# Patient Record
Sex: Female | Born: 1965
Health system: Southern US, Community
[De-identification: ages and names within clinical notes are randomized; demographics above are authoritative.]

## PROBLEM LIST (undated history)

## (undated) DIAGNOSIS — D649 Anemia, unspecified: Secondary | ICD-10-CM

## (undated) DIAGNOSIS — T753XXA Motion sickness, initial encounter: Secondary | ICD-10-CM

## (undated) DIAGNOSIS — M1990 Unspecified osteoarthritis, unspecified site: Secondary | ICD-10-CM

## (undated) DIAGNOSIS — S5290XA Unspecified fracture of unspecified forearm, initial encounter for closed fracture: Secondary | ICD-10-CM

## (undated) DIAGNOSIS — A499 Bacterial infection, unspecified: Secondary | ICD-10-CM

## (undated) DIAGNOSIS — H209 Unspecified iridocyclitis: Secondary | ICD-10-CM

## (undated) DIAGNOSIS — S83206A Unspecified tear of unspecified meniscus, current injury, right knee, initial encounter: Secondary | ICD-10-CM

## (undated) DIAGNOSIS — U071 COVID-19: Secondary | ICD-10-CM

## (undated) DIAGNOSIS — L309 Dermatitis, unspecified: Secondary | ICD-10-CM

## (undated) DIAGNOSIS — J45909 Unspecified asthma, uncomplicated: Secondary | ICD-10-CM

## (undated) DIAGNOSIS — S62101A Fracture of unspecified carpal bone, right wrist, initial encounter for closed fracture: Secondary | ICD-10-CM

## (undated) DIAGNOSIS — R7303 Prediabetes: Secondary | ICD-10-CM

## (undated) DIAGNOSIS — N390 Urinary tract infection, site not specified: Secondary | ICD-10-CM

## (undated) HISTORY — DX: Unspecified tear of unspecified meniscus, current injury, right knee, initial encounter: S83.206A

## (undated) HISTORY — DX: COVID-19: U07.1

## (undated) HISTORY — DX: Anemia, unspecified: D64.9

## (undated) HISTORY — DX: Unspecified asthma, uncomplicated: J45.909

## (undated) HISTORY — PX: CATARACT EXTRACTION W/ INTRAOCULAR LENS IMPLANT: SHX1309

## (undated) HISTORY — DX: Unspecified fracture of unspecified forearm, initial encounter for closed fracture: S52.90XA

## (undated) HISTORY — DX: Urinary tract infection, site not specified: A49.9

## (undated) HISTORY — DX: Urinary tract infection, site not specified: N39.0

## (undated) HISTORY — DX: Unspecified iridocyclitis: H20.9

## (undated) HISTORY — DX: Fracture of unspecified carpal bone, right wrist, initial encounter for closed fracture: S62.101A

---
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
## 1986-07-21 HISTORY — PX: BREAST BIOPSY: SHX20

## 1986-07-21 HISTORY — PX: BREAST SURGERY: SHX581

## 2005-05-09 ENCOUNTER — Emergency Department: Payer: Self-pay | Admitting: Emergency Medicine

## 2005-05-09 ENCOUNTER — Other Ambulatory Visit: Payer: Self-pay

## 2005-07-24 ENCOUNTER — Ambulatory Visit: Payer: Self-pay | Admitting: Family Medicine

## 2005-08-11 ENCOUNTER — Other Ambulatory Visit: Admission: RE | Admit: 2005-08-11 | Discharge: 2005-08-11 | Payer: Self-pay | Admitting: Internal Medicine

## 2005-08-11 ENCOUNTER — Ambulatory Visit: Payer: Self-pay | Admitting: Family Medicine

## 2006-03-12 ENCOUNTER — Emergency Department: Payer: Self-pay | Admitting: Emergency Medicine

## 2006-04-22 ENCOUNTER — Emergency Department: Payer: Self-pay | Admitting: Emergency Medicine

## 2007-03-29 ENCOUNTER — Ambulatory Visit: Payer: Self-pay | Admitting: Nurse Practitioner

## 2007-04-24 ENCOUNTER — Emergency Department: Payer: Self-pay | Admitting: Emergency Medicine

## 2007-09-16 ENCOUNTER — Ambulatory Visit: Payer: Self-pay | Admitting: Otolaryngology

## 2008-02-24 ENCOUNTER — Ambulatory Visit: Payer: Self-pay | Admitting: Internal Medicine

## 2008-04-01 ENCOUNTER — Ambulatory Visit: Payer: Self-pay | Admitting: Family Medicine

## 2009-01-05 ENCOUNTER — Ambulatory Visit: Payer: Self-pay | Admitting: Internal Medicine

## 2009-05-16 ENCOUNTER — Ambulatory Visit: Payer: Self-pay | Admitting: Internal Medicine

## 2009-06-05 ENCOUNTER — Ambulatory Visit: Payer: Self-pay | Admitting: Otolaryngology

## 2009-07-01 ENCOUNTER — Ambulatory Visit: Payer: Self-pay | Admitting: Family Medicine

## 2010-08-27 ENCOUNTER — Ambulatory Visit: Payer: Self-pay | Admitting: Oncology

## 2010-09-19 ENCOUNTER — Ambulatory Visit: Payer: Self-pay | Admitting: Oncology

## 2010-10-20 ENCOUNTER — Ambulatory Visit: Payer: Self-pay | Admitting: Oncology

## 2010-11-19 ENCOUNTER — Ambulatory Visit: Payer: Self-pay | Admitting: Oncology

## 2011-02-25 ENCOUNTER — Ambulatory Visit: Payer: Self-pay | Admitting: Oncology

## 2011-03-22 ENCOUNTER — Ambulatory Visit: Payer: Self-pay | Admitting: Oncology

## 2011-05-07 ENCOUNTER — Ambulatory Visit: Payer: Self-pay | Admitting: Family Medicine

## 2011-05-30 ENCOUNTER — Ambulatory Visit: Payer: Self-pay | Admitting: Oncology

## 2011-06-21 ENCOUNTER — Ambulatory Visit: Payer: Self-pay | Admitting: Oncology

## 2011-07-29 ENCOUNTER — Ambulatory Visit: Payer: Self-pay

## 2011-07-29 LAB — URINALYSIS, COMPLETE
Bilirubin,UR: NEGATIVE
Nitrite: POSITIVE
Ph: 6.5 (ref 4.5–8.0)
Protein: 100
Specific Gravity: 1.02 (ref 1.003–1.030)

## 2011-08-01 LAB — URINE CULTURE

## 2011-08-22 ENCOUNTER — Ambulatory Visit: Payer: Self-pay | Admitting: Oncology

## 2011-08-22 LAB — CBC CANCER CENTER
Basophil #: 0 x10 3/mm (ref 0.0–0.1)
Basophil %: 0.9 %
Eosinophil %: 9.7 %
HCT: 36.6 % (ref 35.0–47.0)
HGB: 12.2 g/dL (ref 12.0–16.0)
Lymphocyte #: 1.5 x10 3/mm (ref 1.0–3.6)
MCH: 26.6 pg (ref 26.0–34.0)
MCHC: 33.4 g/dL (ref 32.0–36.0)
MCV: 80 fL (ref 80–100)
Monocyte #: 0.2 x10 3/mm (ref 0.0–0.7)
Neutrophil #: 1.6 x10 3/mm (ref 1.4–6.5)
WBC: 3.6 x10 3/mm (ref 3.6–11.0)

## 2011-08-22 LAB — IRON AND TIBC
Iron: 50 ug/dL (ref 50–170)
Unbound Iron-Bind.Cap.: 261 ug/dL

## 2011-08-22 LAB — FERRITIN: Ferritin (ARMC): 19 ng/mL (ref 8–388)

## 2011-08-25 ENCOUNTER — Encounter: Payer: Self-pay | Admitting: Pulmonary Disease

## 2011-08-25 ENCOUNTER — Ambulatory Visit (INDEPENDENT_AMBULATORY_CARE_PROVIDER_SITE_OTHER): Payer: Federal, State, Local not specified - PPO | Admitting: Pulmonary Disease

## 2011-08-25 VITALS — BP 122/80 | HR 70 | Temp 97.5°F | Ht 66.0 in | Wt 237.0 lb

## 2011-08-25 DIAGNOSIS — J45909 Unspecified asthma, uncomplicated: Secondary | ICD-10-CM | POA: Insufficient documentation

## 2011-08-25 DIAGNOSIS — IMO0001 Reserved for inherently not codable concepts without codable children: Secondary | ICD-10-CM | POA: Insufficient documentation

## 2011-08-25 DIAGNOSIS — K219 Gastro-esophageal reflux disease without esophagitis: Secondary | ICD-10-CM

## 2011-08-25 DIAGNOSIS — R062 Wheezing: Secondary | ICD-10-CM

## 2011-08-25 DIAGNOSIS — J309 Allergic rhinitis, unspecified: Secondary | ICD-10-CM

## 2011-08-25 MED ORDER — FLUTICASONE FUROATE 27.5 MCG/SPRAY NA SUSP: 2.0000 | Freq: Every day | NASAL | Status: DC

## 2011-08-25 MED ORDER — MOMETASONE FUROATE 220 MCG/INH IN AEPB: 2.0000 | INHALATION_SPRAY | Freq: Two times a day (BID) | RESPIRATORY_TRACT | Status: DC

## 2011-08-25 MED ORDER — ALBUTEROL SULFATE HFA 108 (90 BASE) MCG/ACT IN AERS: 2.0000 | INHALATION_SPRAY | RESPIRATORY_TRACT | Status: DC | PRN

## 2011-08-25 NOTE — Patient Instructions (Signed)
Use [place] Med rinses with distilled water at least twice per day using the instructions on the package. 1/2 hour after using the [place] Med rinse, use Veramyst once per day. Use generic zyrtec (over the counter decongestant, ask the pharmacist for a recommendation) as needed for sinus congestion. Start taking over the counter pepcid twice per day.   We will see you back in 4-6 weeks.

## 2011-08-25 NOTE — Assessment & Plan Note (Signed)
See asthma

## 2011-08-25 NOTE — Assessment & Plan Note (Signed)
Ms. Martello has moderate persistent asthma based on the frequency of her rescue inhaler results and nighttime symptoms.  On spirometry, she has obstruction only by the shape of her flow volume loop but has a normal FEV1 which is reassuring.  Based on the infrequency that she is taking her medications, I don't really think that the Symbicort is adding much so we should be able to start fresh with her med regimen.  Her reflux (seen on laryngoscopy by her ENT, intermittent symptoms) and her sinus congestion and post nasal drip contribute to her poor asthma control.    She also has wall to wall carpet, and a dog and a cat which may contribute to her asthma symptoms.  We will start today with the following changes: -stop symbicort (not really taking it anyway) -start asthmanex at max dose (2 puffs bid) -continue albuterol prn -start nasal saline rinses for sinuses -start using veramyst regularly -start using over the counter anti-histamine (zyrtec) -start pepcid OTC bid for reflux  If we have not made improvement with this then we will consider adding back symbicort or starting singulair on the next visit.  Will also need to consider steps to remove the allergens in her home (carpet, dog, rabbit).  RTC 4-6 weeks

## 2011-08-25 NOTE — Progress Notes (Signed)
Subjective:    Patient ID: Hannah Robinson, female    DOB: 1966-06-07, 46 y.o.   MRN: [id number]  HPI This is a very pleasant 46 y/o female who had childhood asthma who presented to our clinic today for evaluation of worsening asthma symptoms.  She states that when she was a child she had eczema, allergic rhinitis and asthma.  These symptoms subsided by the time she reached her twenties.  She has never smoked.  During the last two years she states that she has developed worsening intermittent wheezing, dry cough, shortness of breath, and nasal congestion.  She initially was evaluated by an ENT physician for this and was told that she not only had allergic rhinitis but also asthma.  She was started on symbicort and veramyst at that time.  She says that when she takes the symbicort she has good control of her symptoms and rarely needs albuterol use.  She also notes that the veramyst works well when she uses it.  However, she notes that the symbicort is expensive despite her insurance so she only uses it sparingly (she states one puff every other day, sometimes less).  On this minimalist regimen she notes that she uses her albuterol rescue inhaler 4-5 times per week, rarely more than once per day. She often wakes up short of breath and wheezing at night.    She also notes that the wheezing and shortness of breath are typically worse in the winter.  She is usually treated once or twice each winter for bronchitis.    She has heartburn symptoms about once per month, but was told by her ENT physician that she had evidence of reflux on laryngoscopy.  She does not use saline rinses or take anything for reflux.  She states that she underwent allergy testing when she was younger and was told that she was allergic to wheat, corn, grasses, nuts (not peanut), and animal dander.  Past Medical History  Diagnosis Date  . Childhood asthma      Family History  Problem Relation Age of Onset  . Lung cancer  Maternal Aunt   . Asthma Son   . Clotting disorder Mother      History   Social History  . Marital Status: Single    Spouse Name: N/A    Number of Children: 2  . 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 Smoking status: Never Smoker   . Smokeless tobacco: Never Used  . Alcohol Use: 2.0 oz/week    4 drink(s) per week     Wine  . Drug Use: No  . Sexually Active: Not on file   Other Topics Concern  . Not on file   Social History Narrative  . No narrative on file     Allergies  Allergen Reactions  . Amoxicillin     rash     Review of Systems  Constitutional: Negative for fever, chills and unexpected weight change.  HENT: Positive for congestion. Negative for ear pain, nosebleeds, sore throat, rhinorrhea, sneezing, trouble swallowing, dental problem, voice change, postnasal drip and sinus pressure.   Eyes: Negative for visual disturbance.  Respiratory: Positive for shortness of breath. Negative for cough and choking.   Cardiovascular: Positive for chest pain. Negative for leg swelling.  Gastrointestinal: Negative for vomiting, abdominal pain and diarrhea.  Genitourinary: Negative for difficulty urinating.  Musculoskeletal: Negative for arthralgias.  Skin: Negative for rash.  Neurological:  Negative for tremors, syncope and headaches.  Hematological: Does not bruise/bleed easily.      Objective:   Physical Exam  Filed Vitals:   08/25/11 1452  BP: 122/80  Pulse: 70  Temp: 97.5 F (36.4 C)  TempSrc: Oral  Height: 5\' 6"  (1.676 m)  Weight: 237 lb (107.502 kg)  SpO2: 99%   Gen: well appearing, no acute distress HEENT: NCAT, PERRL, EOMi, OP clear, neck supple without masses PULM: scattered wheezing (insp and exp) throughout, good airmovement CV: RRR, no mgr, mid systolic click?, no JVD AB: BS+, soft, nontender, no hsm Ext: warm, no edema, no clubbing, no cyanosis Derm: no rash or skin breakdown Neuro:  A&Ox4, CN II-XII intact, strength 5/5 in all 4 extremities  08/25/11 Asthma Control Questionnaire: (14/7) 2 (poor control)  08/25/11 PFT: Ratio: 75% FEV1 2.16 (85% pred)  ACQ Questionnaire References:  Juniper EF, O'Byrne PM, Guyatt GH, Pierre Bali DR. Development and validation of a questionnaire to measure asthma control. Eur Respir J 1999; 14: 902-7. Juniper EF, Duane Boston, Mrk AC, Sthl E. Measurement properties and interpretation of three shortened versions of the asthma control questionnaire. Respir Med 2005; 99: 553-8. Juniper EF, Isabel Caprice ED. Identifying 'well-controlled' and 'not well-controlled' asthma using the Asthma Control Questionnaire. Respir Med 2006; 100: 616- 621    Assessment & Plan:   Asthma Ms. Pascale has moderate persistent asthma based on the frequency of her rescue inhaler results and nighttime symptoms.  On spirometry, she has obstruction only by the shape of her flow volume loop but has a normal FEV1 which is reassuring.  Based on the infrequency that she is taking her medications, I don't really think that the Symbicort is adding much so we should be able to start fresh with her med regimen.  Her reflux (seen on laryngoscopy by her ENT, intermittent symptoms) and her sinus congestion and post nasal drip contribute to her poor asthma control.    She also has wall to wall carpet, and a dog and a cat which may contribute to her asthma symptoms.  We will start today with the following changes: -stop symbicort (not really taking it anyway) -start asthmanex at max dose (2 puffs bid) -continue albuterol prn -start nasal saline rinses for sinuses -start using veramyst regularly -start using over the counter anti-histamine (zyrtec) -start pepcid OTC bid for reflux  If we have not made improvement with this then we will consider adding back symbicort or starting singulair on the next visit.  Will also need to consider steps to remove the allergens  in her home (carpet, dog, rabbit).  RTC 4-6 weeks  Rhinitis, allergic See asthma  Reflux -Lifestyle modifications -pepcid bid    Updated Medication List Outpatient Encounter Prescriptions as of 08/25/2011  Medication Sig Dispense Refill  . budesonide-formoterol (SYMBICORT) 80-4.5 MCG/ACT inhaler Inhale 2 puffs into the lungs 2 (two) times daily.      . Cholecalciferol (VITAMIN D3) 50000 UNITS CAPS Take 1 capsule by mouth every 7 (seven) days.      . nitrofurantoin (MACRODANTIN) 100 MG capsule Take 100 mg by mouth 2 (two) times daily.      . norethindrone (MICRONOR,CAMILA,ERRIN) 0.35 MG tablet Take 1 tablet by mouth daily.      Marland Kitchen albuterol (PROAIR HFA) 108 (90 BASE) MCG/ACT inhaler Inhale 2 puffs into the lungs every 4 (four) hours as needed for wheezing.  1 Inhaler  3  . fluticasone (VERAMYST) 27.5 MCG/SPRAY nasal spray Place 2 sprays  into the nose daily.  10 g  3  . mometasone (ASMANEX) 220 MCG/INH inhaler Inhale 2 puffs into the lungs 2 (two) times daily.  1 Inhaler  12  . DISCONTD: fluticasone (VERAMYST) 27.5 MCG/SPRAY nasal spray Place 2 sprays into the nose daily.

## 2011-08-25 NOTE — Assessment & Plan Note (Signed)
-  Lifestyle modifications -pepcid bid

## 2011-08-28 ENCOUNTER — Telehealth: Payer: Self-pay | Admitting: Pulmonary Disease

## 2011-08-28 MED ORDER — BECLOMETHASONE DIPROPIONATE 80 MCG/ACT IN AERS: 2.0000 | INHALATION_SPRAY | Freq: Two times a day (BID) | RESPIRATORY_TRACT | Status: DC

## 2011-08-28 NOTE — Telephone Encounter (Signed)
Spoke with pt. She states that although the asmanex is covered by her insurance, she will still pay 62 $ for 1 month supply. She states unable to afford this and would like [place] to call in something else. I advised that she should speak with her pharmacist about other alternatives that may be less expensive, she may need to obtain her formulary list and then she will call us back and at that time will discuss other options with Dr. Kendrick Fries.

## 2011-08-28 NOTE — Telephone Encounter (Signed)
Will change to QVar bid.  Rx written

## 2011-08-28 NOTE — Telephone Encounter (Signed)
Verlon Au, Can you call her tomorrow to let her know I changed her over?  Thanks, Kipp Brood

## 2011-08-28 NOTE — Telephone Encounter (Signed)
Patient called back and said that she checked with her insurance and found that she can get Qvar for much cheaper. She is asking if this would be a good choice.

## 2011-09-02 LAB — HM MAMMOGRAPHY: HM Mammogram: NORMAL

## 2011-09-02 NOTE — Telephone Encounter (Signed)
LMOM for pt to be made aware that the rx for qvar was sent

## 2011-09-19 ENCOUNTER — Ambulatory Visit: Payer: Self-pay | Admitting: Oncology

## 2011-09-23 ENCOUNTER — Ambulatory Visit: Payer: Federal, State, Local not specified - PPO | Admitting: Pulmonary Disease

## 2011-10-08 ENCOUNTER — Encounter: Payer: Self-pay | Admitting: Pulmonary Disease

## 2011-10-08 ENCOUNTER — Ambulatory Visit (INDEPENDENT_AMBULATORY_CARE_PROVIDER_SITE_OTHER): Payer: Federal, State, Local not specified - PPO | Admitting: Pulmonary Disease

## 2011-10-08 DIAGNOSIS — J45909 Unspecified asthma, uncomplicated: Secondary | ICD-10-CM

## 2011-10-08 DIAGNOSIS — J309 Allergic rhinitis, unspecified: Secondary | ICD-10-CM

## 2011-10-08 MED ORDER — MONTELUKAST SODIUM 10 MG PO TABS: 10.0000 mg | ORAL_TABLET | Freq: Every day | ORAL | Status: DC

## 2011-10-08 MED ORDER — FLUTICASONE PROPIONATE HFA 110 MCG/ACT IN AERO: 2.0000 | INHALATION_SPRAY | Freq: Two times a day (BID) | RESPIRATORY_TRACT | Status: DC

## 2011-10-08 NOTE — Assessment & Plan Note (Signed)
Lometa initially improved quite a bit after starting the [place] Med rinses and regular inhaled steroid use.  However her allergic rhinitis has been flaring recently which is making her asthma worse.  She wants to switch her inhaled steroid due to cost.  Plan: -Change to Flovent inhaled bid; could change to inhaled if allergy symptoms and wheezing improve -Start monelukast 10mg  daily -continue rinses

## 2011-10-08 NOTE — Assessment & Plan Note (Signed)
Adding montelukast Continue saline rinses and nasal steroid and zyrtec

## 2011-10-08 NOTE — Patient Instructions (Signed)
Use Singulair 10mg  po daily. Use the Flovent 2 puffs twice per day of the inhaler for 2-3 weeks.  If your symptoms improve you can decrease down to 1 puff twice a day.  Continue your medication as written.  We will see you back in 2-3 months

## 2011-10-08 NOTE — Progress Notes (Signed)
Subjective:    Patient ID: Hannah Robinson, female    DOB: 1965/12/07, 46 y.o.   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  At that point she was non-compliant with her symbicort.  Her symptoms were consistent with moderate persistent asthma. She states that she underwent allergy testing when she was younger and was told that she was allergic to wheat, corn, grasses, nuts (not peanut), and animal dander.  She also noted heart burn symptoms.  HPI   3/20 ROV -- Initially Sevana was doing better after last visit, but in the last two weeks she has had more nasal mucus, wheezing, and cough.  She denies fever, chills, or body aches.  She says that she has noticed a scratchy throat as well.  Her son who also has asthma has been wheezing more.  She states that there is no color to mucus, no n/v/d.  She states that prior to this her allergic rhinitis and wheezing improved with the QVAR and nasal meds we wrote on the last visit.  Past Medical History  Diagnosis Date  . Childhood asthma      Review of Systems  Constitutional: Positive for fatigue. Negative for fever, chills and unexpected weight change.  HENT: Positive for congestion, sore throat, rhinorrhea, postnasal drip and sinus pressure. Negative for sneezing.   Eyes: Negative for visual disturbance.  Respiratory: Positive for cough, shortness of breath and wheezing. Negative for choking.   Cardiovascular: Negative for chest pain and leg swelling.  Gastrointestinal: Negative for nausea, vomiting, abdominal pain and diarrhea.  Musculoskeletal: Negative for myalgias and arthralgias.      Objective:   Physical Exam   Filed Vitals:   10/08/11 1651  BP: 120/76  Pulse: 88  Temp: 97.7 F (36.5 C)  TempSrc: Oral  Height: 5\' 6"  (1.676 m)  Weight: 243 lb (110.224 kg)  SpO2: 98%   Gen: well appearing, no acute distress HEENT: NCAT, PERRL, EOMi, OP clear, neck supple without  masses; bluish nasal mucosa noted PULM: scattered wheezing (insp and exp) throughout, good air movement CV: RRR, no mgr, no JVD AB: BS+, soft, nontender, no hsm Ext: warm, no edema, no clubbing, no cyanosis  08/25/11 Asthma Control Questionnaire: (14/7) 2 (poor control)   08/25/11 PFT: Ratio: 75% FEV1 2.16 (85% pred)  ACQ Questionnaire References:  Juniper EF, O'Byrne PM, Guyatt GH, Pierre Bali DR. Development and validation of a questionnaire to measure asthma control. Eur Respir J 1999; 14: 902-7. Juniper EF, Duane Boston, Mrk AC, Sthl E. Measurement properties and interpretation of three shortened versions of the asthma control questionnaire. Respir Med 2005; 99: 553-8. Juniper EF, Isabel Caprice ED. Identifying 'well-controlled' and 'not well-controlled' asthma using the Asthma Control Questionnaire. Respir Med 2006; 100: 616- 621    Assessment & Plan:   Asthma Roxan initially improved quite a bit after starting the [place] Med rinses and regular inhaled steroid use.  However her allergic rhinitis has been flaring recently which is making her asthma worse.  She wants to switch her inhaled steroid due to cost.  Plan: -Change to Flovent inhaled bid; could change to inhaled if allergy symptoms and wheezing improve -Start monelukast 10mg  daily -continue rinses  Rhinitis, allergic Adding montelukast Continue saline rinses and nasal steroid and zyrtec   We will try to obtain the old records of her prior allergy testing and consider referral to an allergist on the next  visit.  Updated Medication List Outpatient Encounter Prescriptions as of 10/08/2011  Medication Sig Dispense Refill  . albuterol (PROAIR HFA) 108 (90 BASE) MCG/ACT inhaler Inhale 2 puffs into the lungs every 4 (four) hours as needed for wheezing.  1 Inhaler  3  . beclomethasone (QVAR) 80 MCG/ACT inhaler Inhale 2 puffs into the lungs 2 (two) times daily.  1 Inhaler  2  . Cholecalciferol  (VITAMIN D3) 50000 UNITS CAPS Take 1 capsule by mouth every 7 (seven) days.      . fluticasone (VERAMYST) 27.5 MCG/SPRAY nasal spray Place 2 sprays into the nose daily.  10 g  3  . nitrofurantoin (MACRODANTIN) 100 MG capsule Take 100 mg by mouth 2 (two) times daily.      . norethindrone (MICRONOR,CAMILA,ERRIN) 0.35 MG tablet Take 1 tablet by mouth daily.

## 2011-12-07 ENCOUNTER — Emergency Department: Payer: Self-pay | Admitting: Emergency Medicine

## 2011-12-08 ENCOUNTER — Ambulatory Visit: Payer: Federal, State, Local not specified - PPO | Admitting: Pulmonary Disease

## 2012-01-01 ENCOUNTER — Ambulatory Visit: Payer: Federal, State, Local not specified - PPO | Admitting: Pulmonary Disease

## 2012-01-08 ENCOUNTER — Telehealth: Payer: Self-pay | Admitting: Pulmonary Disease

## 2012-01-08 MED ORDER — ALBUTEROL SULFATE HFA 108 (90 BASE) MCG/ACT IN AERS: 2.0000 | INHALATION_SPRAY | RESPIRATORY_TRACT | Status: DC | PRN

## 2012-01-08 NOTE — Telephone Encounter (Signed)
Pt states she went to the ED 3 weeks ago and was given Ventolin HFA for rescue purposes and it is not working for her. She would like to have a refill for her Proair called to the pharmacy. Pt is due for follow-up with Dr. Kendrick Fries in [place] on 01/30/12 and aware to keep that ov or call if having problems prior to that ov. Pt verbalized understanding. RX sent.

## 2012-01-30 ENCOUNTER — Ambulatory Visit (INDEPENDENT_AMBULATORY_CARE_PROVIDER_SITE_OTHER): Payer: Federal, State, Local not specified - PPO | Admitting: Pulmonary Disease

## 2012-01-30 ENCOUNTER — Other Ambulatory Visit: Payer: Self-pay | Admitting: *Deleted

## 2012-01-30 ENCOUNTER — Encounter: Payer: Self-pay | Admitting: Pulmonary Disease

## 2012-01-30 VITALS — BP 116/80 | HR 77 | Temp 98.1°F | Ht 65.5 in | Wt 244.8 lb

## 2012-01-30 DIAGNOSIS — J45909 Unspecified asthma, uncomplicated: Secondary | ICD-10-CM

## 2012-01-30 MED ORDER — ALBUTEROL SULFATE HFA 108 (90 BASE) MCG/ACT IN AERS: 2.0000 | INHALATION_SPRAY | RESPIRATORY_TRACT | Status: DC | PRN

## 2012-01-30 MED ORDER — ALBUTEROL SULFATE HFA 108 (90 BASE) MCG/ACT IN AERS: 2.0000 | INHALATION_SPRAY | Freq: Four times a day (QID) | RESPIRATORY_TRACT | Status: DC | PRN

## 2012-01-30 NOTE — Progress Notes (Signed)
Subjective:    Patient ID: Hannah Robinson, female    DOB: 1966/06/02, 46 y.o.   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  At that point she was non-compliant with her symbicort.  Her symptoms were consistent with moderate persistent asthma. She states that she underwent allergy testing when she was younger and was told that she was allergic to wheat, corn, grasses, nuts (not peanut), and animal dander.  She also noted heart burn symptoms.  HPI   3/20 ROV -- Initially Hannah Robinson was doing better after last visit, but in the last two weeks she has had more nasal mucus, wheezing, and cough.  She denies fever, chills, or body aches.  She says that she has noticed a scratchy throat as well.  Her son who also has asthma has been wheezing more.  She states that there is no color to mucus, no n/v/d.  She states that prior to this her allergic rhinitis and wheezing improved with the QVAR and nasal meds we wrote on the last visit.  01/30/12 ROV -- since her last visit and has had an episode of bronchitis requiring an antibiotic and prednisone therapy. This occurred around June 1. Since then she's been slowly improving but still notes that she wheezes in the morning when she wakes up and sometimes in the middle the night. The children state they believe that she is wheezing in the middle the night. She states that she has carpet and drapes in her bedroom which she believes are dusty. Her dog also sleeps in the bedroom. 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 But she says that she is able to complete a full days work without having to use the inhaler again until in the middle the night. She did not pick up the Singulair as she had such improvement after last visit and she does not think the Qvar as effective as Symbicort was in the past.  Past Medical History  Diagnosis Date    . Childhood asthma      Review of Systems  Constitutional: Negative for fever, chills, fatigue and unexpected weight change.  HENT: Negative for congestion, sore throat, rhinorrhea, sneezing, postnasal drip and sinus pressure.   Respiratory: Positive for shortness of breath and wheezing. Negative for cough and choking.   Cardiovascular: Negative for chest pain and leg swelling.      Objective:   Physical Exam   Filed Vitals:   01/30/12 1342  BP: 116/80  Pulse: 77  Temp: 98.1 F (36.7 C)  TempSrc: Oral  Height: 5' 5.5" (1.664 m)  Weight: 244 lb 12.8 oz (111.041 kg)  SpO2: 100%   Gen: well appearing, no acute distress HEENT: NCAT, PERRL, EOMi, OP clear, neck supple without masses; bluish nasal mucosa noted PULM: scattered wheezing  Noted again (insp/exp), good air movement CV: RRR, no mgr, no JVD AB: BS+, soft, nontender, no hsm Ext: warm, no edema, no clubbing, no cyanosis  08/25/11 Asthma Control Questionnaire: (14/7) 2 (poor control) 08/25/11 PFT: Ratio: 75% FEV1 2.16 (85% pred) 01/2012 Ratio 80%, FEV1 2.2L 88% pred 01/2012 ACQ 14/7  ACQ Questionnaire References:  Juniper EF, O'Byrne PM, Guyatt GH, Pierre Bali DR. Development and validation of a questionnaire to measure asthma control. Eur Respir J 1999; 14: 902-7. Juniper EF, Duane Boston, Mrk AC, Sthl E. Measurement properties and interpretation of three  shortened versions of the asthma control questionnaire. Respir Med 2005; 99: 553-8. Juniper EF, Isabel Caprice ED. Identifying 'well-controlled' and 'not well-controlled' asthma using the Asthma Control Questionnaire. Respir Med 2006; 100: 616- 621    Assessment & Plan:   Asthma Since her last visit of that has had an episode of bronchitis requiring antibiotics and prednisone. This occurred in June 2013. She has since recovered pretty well but she states that she is still having some shortness of breath in the mornings requiring daily rescue inhaler use.  Her spirometry and ACQ score have not changed but the subjective change bothers me. It is reasonable to step up her therapy at least for the next couple months. Am also concerned that she could have some allergy in her bedroom she states that she gets particularly short of breath at night and when she's in room. I've asked her to contact allergist about this  Plan: -Contact the allergist whom you've seen before and asked for followup appointment, have his records sent to me -Stop Qvar, start Symbicort 80/4.5 2 puffs twice a day -Continue other therapies     Updated Medication List Outpatient Encounter Prescriptions as of 01/30/2012  Medication Sig Dispense Refill  . albuterol (PROAIR HFA) 108 (90 BASE) MCG/ACT inhaler Inhale 2 puffs into the lungs every 4 (four) hours as needed for wheezing.  1 Inhaler  0  . beclomethasone (QVAR) 80 MCG/ACT inhaler Inhale 2 puffs into the lungs 2 (two) times daily.      . cetirizine (ZYRTEC) 10 MG tablet Take 10 mg by mouth daily.      . famotidine (PEPCID) 20 MG tablet Take 20 mg by mouth daily.      . fluticasone (VERAMYST) 27.5 MCG/SPRAY nasal spray Place 2 sprays into the nose daily.  10 g  3  . Sodium Chloride-Sodium Bicarb (CLASSIC NETI POT SINUS WASH NA) Use as directed twice daily as needed      . DISCONTD: fluticasone (FLOVENT HFA) 110 MCG/ACT inhaler Inhale 2 puffs into the lungs 2 (two) times daily.  1 Inhaler  12  . DISCONTD: montelukast (SINGULAIR) 10 MG tablet Take 1 tablet (10 mg total) by mouth at bedtime.  30 tablet  2

## 2012-01-30 NOTE — Patient Instructions (Signed)
Stop QVar Start Symbicort (use the sample 1 puff twice a day, the Rx will be 2 puffs twice a day) Call your allergist and schedule another appointment Send [place] the result of your allergy test. We will see you back in 3 months or sooner if needed.

## 2012-01-30 NOTE — Assessment & Plan Note (Signed)
Since her last visit of that has had an episode of bronchitis requiring antibiotics and prednisone. This occurred in June 2013. She has since recovered pretty well but she states that she is still having some shortness of breath in the mornings requiring daily rescue inhaler use. Her spirometry and ACQ score have not changed but the subjective change bothers me. It is reasonable to step up her therapy at least for the next couple months. Am also concerned that she could have some allergy in her bedroom she states that she gets particularly short of breath at night and when she's in room. I've asked her to contact allergist about this  Plan: -Contact the allergist whom you've seen before and asked for followup appointment, have his records sent to me -Stop Qvar, start Symbicort 80/4.5 2 puffs twice a day -Continue other therapies

## 2012-03-01 ENCOUNTER — Encounter: Payer: Self-pay | Admitting: Internal Medicine

## 2012-03-01 ENCOUNTER — Ambulatory Visit (INDEPENDENT_AMBULATORY_CARE_PROVIDER_SITE_OTHER): Payer: Federal, State, Local not specified - PPO | Admitting: Internal Medicine

## 2012-03-01 VITALS — BP 140/80 | HR 96 | Temp 98.7°F | Ht 66.0 in | Wt 245.2 lb

## 2012-03-01 DIAGNOSIS — L0231 Cutaneous abscess of buttock: Secondary | ICD-10-CM

## 2012-03-01 DIAGNOSIS — J45909 Unspecified asthma, uncomplicated: Secondary | ICD-10-CM

## 2012-03-01 DIAGNOSIS — R7309 Other abnormal glucose: Secondary | ICD-10-CM

## 2012-03-01 DIAGNOSIS — H209 Unspecified iridocyclitis: Secondary | ICD-10-CM | POA: Insufficient documentation

## 2012-03-01 DIAGNOSIS — D509 Iron deficiency anemia, unspecified: Secondary | ICD-10-CM | POA: Insufficient documentation

## 2012-03-01 DIAGNOSIS — R739 Hyperglycemia, unspecified: Secondary | ICD-10-CM

## 2012-03-01 DIAGNOSIS — L03317 Cellulitis of buttock: Secondary | ICD-10-CM | POA: Insufficient documentation

## 2012-03-01 DIAGNOSIS — Z1283 Encounter for screening for malignant neoplasm of skin: Secondary | ICD-10-CM | POA: Insufficient documentation

## 2012-03-01 LAB — POCT URINALYSIS DIPSTICK
Ketones, UA: NEGATIVE
Leukocytes, UA: NEGATIVE
Urobilinogen, UA: 0.2
pH, UA: 7

## 2012-03-01 MED ORDER — ALBUTEROL SULFATE HFA 108 (90 BASE) MCG/ACT IN AERS: 2.0000 | INHALATION_SPRAY | RESPIRATORY_TRACT | Status: DC | PRN

## 2012-03-01 MED ORDER — BUDESONIDE-FORMOTEROL FUMARATE 80-4.5 MCG/ACT IN AERO: 2.0000 | INHALATION_SPRAY | Freq: Two times a day (BID) | RESPIRATORY_TRACT | Status: DC

## 2012-03-01 MED ORDER — SULFAMETHOXAZOLE-TRIMETHOPRIM 800-160 MG PO TABS: 1.0000 | ORAL_TABLET | Freq: Two times a day (BID) | ORAL | Status: AC

## 2012-03-01 NOTE — Assessment & Plan Note (Signed)
Symptoms improving per pt, but continuing to have some wheezing, using Albuterol 2-3 times per day. Question if she might benefit from allergy testing. Will continue Symbicort and prn Albuterol for now. Follow up 2-4 weeks.

## 2012-03-01 NOTE — Progress Notes (Signed)
Subjective:    Patient ID: Hannah Robinson, female    DOB: 10-25-1965, 46 y.o.   MRN: [id number]  HPI 46 year old female with history of asthma and GERD presents to establish care. She reports she is generally feeling well. Her only concern today is a bump near her rectum. She reports this has been present for about 3 days. It is described as raised and painful. She has not been able to visualize the area. She denies any fever or chills.  In regards to her history of asthma, she notes some recent increased use of her albuterol. She has been followed by pulmonary medicine and recently started on Symbicort. She typically uses her rescue inhaler 2-3 times per day. She has occasional nonproductive cough and wheezing. She denies fever, chills, chest pain.  Outpatient Encounter Prescriptions as of 03/01/2012  Medication Sig Dispense Refill  . albuterol (PROAIR HFA) 108 (90 BASE) MCG/ACT inhaler Inhale 2 puffs into the lungs every 4 (four) hours as needed for wheezing.  1 Inhaler  5  . budesonide-formoterol (SYMBICORT) 80-4.5 MCG/ACT inhaler Inhale 2 puffs into the lungs 2 (two) times daily.  1 Inhaler  6  . cetirizine (ZYRTEC) 10 MG tablet Take 10 mg by mouth daily.      . famotidine (PEPCID) 20 MG tablet Take 20 mg by mouth daily.      . fluticasone (VERAMYST) 27.5 MCG/SPRAY nasal spray Place 2 sprays into the nose daily.  10 g  3  . Sodium Chloride-Sodium Bicarb (CLASSIC NETI POT SINUS WASH NA) Use as directed twice daily as needed      . sulfamethoxazole-trimethoprim (BACTRIM DS,SEPTRA DS) 800-160 MG per tablet Take 1 tablet by mouth 2 (two) times daily.  20 tablet  0   BP 140/80  Pulse 96  Temp 98.7 F (37.1 C) (Oral)  Ht 5\' 6"  (1.676 m)  Wt 245 lb 4 oz (111.245 kg)  BMI 39.58 kg/m2  SpO2 97%  Review of Systems  Constitutional: Negative for fever, chills, appetite change, fatigue and unexpected weight change.  HENT: Negative for ear pain, congestion, sore throat, trouble swallowing, neck  pain, voice change and sinus pressure.   Eyes: Negative for visual disturbance.  Respiratory: Positive for cough, shortness of breath and wheezing. Negative for stridor.   Cardiovascular: Negative for chest pain, palpitations and leg swelling.  Gastrointestinal: Negative for nausea, vomiting, abdominal pain, diarrhea, constipation, blood in stool, abdominal distention and anal bleeding.  Genitourinary: Negative for dysuria and flank pain.  Musculoskeletal: Negative for myalgias, arthralgias and gait problem.  Skin: Negative for color change and rash.  Neurological: Negative for dizziness and headaches.  Hematological: Negative for adenopathy. Does not bruise/bleed easily.  Psychiatric/Behavioral: Negative for suicidal ideas, disturbed wake/sleep cycle and dysphoric mood. The patient is not nervous/anxious.        Objective:   Physical Exam  Constitutional: She is oriented to person, place, and time. She appears well-developed and well-nourished. No distress.  HENT:  Head: Normocephalic and atraumatic.  Right Ear: External ear normal.  Left Ear: External ear normal.  Nose: Nose normal.  Mouth/Throat: Oropharynx is clear and moist. No oropharyngeal exudate.  Eyes: Conjunctivae are normal. Pupils are equal, round, and reactive to light. Right eye exhibits no discharge. Left eye exhibits no discharge. No scleral icterus.  Neck: Normal range of motion. Neck supple. No tracheal deviation present. No thyromegaly present.  Cardiovascular: Normal rate, regular rhythm, normal heart sounds and intact distal pulses.  Exam reveals no gallop and no friction  rub.   No murmur heard. Pulmonary/Chest: Effort normal. No respiratory distress. She has decreased breath sounds (prolonged expiration). She has wheezes. She has no rales. She exhibits no tenderness.  Genitourinary:     Musculoskeletal: Normal range of motion. She exhibits no edema and no tenderness.  Lymphadenopathy:    She has no cervical  adenopathy.  Neurological: She is alert and oriented to person, place, and time. No cranial nerve deficit. She exhibits normal muscle tone. Coordination normal.  Skin: Skin is warm and dry. No rash noted. She is not diaphoretic. No erythema. No pallor.  Psychiatric: She has a normal mood and affect. Her behavior is normal. Judgment and thought content normal.          Assessment & Plan:

## 2012-03-01 NOTE — Assessment & Plan Note (Signed)
Pt reports borderline DM. Will check A1c with labs.

## 2012-03-01 NOTE — Assessment & Plan Note (Signed)
Will set up yearly exam with derm.

## 2012-03-01 NOTE — Assessment & Plan Note (Signed)
H/o Iritis. Will set up follow up with opthalmology.

## 2012-03-01 NOTE — Assessment & Plan Note (Signed)
Pt reports iron def anemia. Will check CBC and ferritin with labs.

## 2012-03-01 NOTE — Assessment & Plan Note (Signed)
Left sided perirectal abscess. Will treat empirically with Bactrim and will set up gen surgery referral for I and D.

## 2012-03-02 ENCOUNTER — Other Ambulatory Visit: Payer: Self-pay | Admitting: *Deleted

## 2012-03-02 LAB — COMPREHENSIVE METABOLIC PANEL
ALT: 18 U/L (ref 0–35)
AST: 25 U/L (ref 0–37)
Albumin: 4 g/dL (ref 3.5–5.2)
Alkaline Phosphatase: 70 U/L (ref 39–117)
Potassium: 3.9 mEq/L (ref 3.5–5.1)
Sodium: 137 mEq/L (ref 135–145)
Total Bilirubin: 0.3 mg/dL (ref 0.3–1.2)
Total Protein: 8.4 g/dL — ABNORMAL HIGH (ref 6.0–8.3)

## 2012-03-02 LAB — FERRITIN: Ferritin: 15.8 ng/mL (ref 10.0–291.0)

## 2012-03-02 LAB — CBC WITH DIFFERENTIAL/PLATELET
Basophils Absolute: 0 10*3/uL (ref 0.0–0.1)
Basophils Relative: 0.7 % (ref 0.0–3.0)
Eosinophils Absolute: 0.3 10*3/uL (ref 0.0–0.7)
Lymphocytes Relative: 43.2 % (ref 12.0–46.0)
MCHC: 32.2 g/dL (ref 30.0–36.0)
MCV: 81.3 fl (ref 78.0–100.0)
Monocytes Absolute: 0.3 10*3/uL (ref 0.1–1.0)
Neutrophils Relative %: 44.1 % (ref 43.0–77.0)
Platelets: 260 10*3/uL (ref 150.0–400.0)
RBC: 4.68 Mil/uL (ref 3.87–5.11)

## 2012-03-02 LAB — LIPID PANEL
Cholesterol: 152 mg/dL (ref 0–200)
LDL Cholesterol: 69 mg/dL (ref 0–99)
Total CHOL/HDL Ratio: 2
VLDL: 8.6 mg/dL (ref 0.0–40.0)

## 2012-03-02 MED ORDER — CIPROFLOXACIN HCL 250 MG PO TABS: 250.0000 mg | ORAL_TABLET | Freq: Two times a day (BID) | ORAL | Status: DC

## 2012-03-03 ENCOUNTER — Ambulatory Visit: Payer: Federal, State, Local not specified - PPO | Admitting: Internal Medicine

## 2012-03-04 LAB — URINE CULTURE

## 2012-03-10 ENCOUNTER — Other Ambulatory Visit (INDEPENDENT_AMBULATORY_CARE_PROVIDER_SITE_OTHER): Payer: Federal, State, Local not specified - PPO | Admitting: *Deleted

## 2012-03-10 DIAGNOSIS — N390 Urinary tract infection, site not specified: Secondary | ICD-10-CM

## 2012-03-12 LAB — URINE CULTURE
Colony Count: NO GROWTH
Organism ID, Bacteria: NO GROWTH

## 2012-04-02 ENCOUNTER — Ambulatory Visit: Payer: Federal, State, Local not specified - PPO | Admitting: Internal Medicine

## 2012-04-12 ENCOUNTER — Ambulatory Visit: Payer: Federal, State, Local not specified - PPO | Admitting: Internal Medicine

## 2012-05-12 ENCOUNTER — Telehealth: Payer: Self-pay | Admitting: Internal Medicine

## 2012-05-12 NOTE — Telephone Encounter (Signed)
No samples of Symbicort at this time, advised patient to call back at the end of the week to check again.

## 2012-05-12 NOTE — Telephone Encounter (Signed)
Pt is wanting to know if she could get samples of Cymbacort.

## 2012-05-14 ENCOUNTER — Telehealth: Payer: Self-pay | Admitting: Internal Medicine

## 2012-05-14 ENCOUNTER — Telehealth: Payer: Self-pay | Admitting: Pulmonary Disease

## 2012-05-14 NOTE — Telephone Encounter (Signed)
Pt last seen 7.12.13 by BQ, given samples of symbicort.  Pt was then seen by PCP on 8.12.13 and symbicort 80-4.13mcg was rx'd.  Pt has upcoming ov w/ BQ 11.5.13.  Left detailed message on named voicemail informing pt sample will be provided and she may pick this up at her convenience Monday afternoon.  Advised pt to call with any other questions/conerns.  Will sign off.

## 2012-05-14 NOTE — Telephone Encounter (Signed)
Patient states that she spoke to Dr Corey Skains office and that are going to send her some samples over Monday.

## 2012-05-14 NOTE — Telephone Encounter (Signed)
Pt called checking to see if we have samples of symbort

## 2012-05-24 ENCOUNTER — Ambulatory Visit: Payer: Federal, State, Local not specified - PPO | Admitting: Pulmonary Disease

## 2012-05-25 ENCOUNTER — Ambulatory Visit: Payer: Federal, State, Local not specified - PPO | Admitting: Pulmonary Disease

## 2012-05-25 ENCOUNTER — Telehealth: Payer: Self-pay | Admitting: Pulmonary Disease

## 2012-05-25 DIAGNOSIS — J45909 Unspecified asthma, uncomplicated: Secondary | ICD-10-CM

## 2012-05-25 NOTE — Telephone Encounter (Signed)
Patient needing samples of Pro Air, Veramist and Symbicort 80 mg.

## 2012-05-25 NOTE — Telephone Encounter (Signed)
No samples available Will bring to office next wk Will hold in my basket until then Pt aware

## 2012-06-01 MED ORDER — BUDESONIDE-FORMOTEROL FUMARATE 80-4.5 MCG/ACT IN AERO: 2.0000 | INHALATION_SPRAY | Freq: Two times a day (BID) | RESPIRATORY_TRACT | Status: DC

## 2012-06-01 NOTE — Telephone Encounter (Signed)
No samples of veramyst or proair 1 box of symbicort up front for pick up at the [place] office [facility] for the pt to be made aware

## 2012-07-12 ENCOUNTER — Telehealth: Payer: Self-pay | Admitting: Pulmonary Disease

## 2012-07-12 DIAGNOSIS — J45909 Unspecified asthma, uncomplicated: Secondary | ICD-10-CM

## 2012-07-12 MED ORDER — BUDESONIDE-FORMOTEROL FUMARATE 80-4.5 MCG/ACT IN AERO: 2.0000 | INHALATION_SPRAY | Freq: Two times a day (BID) | RESPIRATORY_TRACT | Status: DC

## 2012-07-12 NOTE — Telephone Encounter (Signed)
I spoke with Asher Muir at [place] office and she has 2 samples for the pt at front desk. Pt is aware. Carron Curie, CMA

## 2012-07-29 ENCOUNTER — Telehealth: Payer: Self-pay | Admitting: Internal Medicine

## 2012-07-29 NOTE — Telephone Encounter (Signed)
error 

## 2012-08-06 ENCOUNTER — Ambulatory Visit: Payer: Federal, State, Local not specified - PPO | Admitting: Internal Medicine

## 2012-08-09 ENCOUNTER — Encounter: Payer: Self-pay | Admitting: Internal Medicine

## 2012-08-09 ENCOUNTER — Ambulatory Visit (INDEPENDENT_AMBULATORY_CARE_PROVIDER_SITE_OTHER): Payer: Federal, State, Local not specified - PPO | Admitting: Internal Medicine

## 2012-08-09 VITALS — BP 118/70 | HR 64 | Temp 98.1°F | Ht 66.0 in | Wt 237.5 lb

## 2012-08-09 DIAGNOSIS — E559 Vitamin D deficiency, unspecified: Secondary | ICD-10-CM

## 2012-08-09 DIAGNOSIS — R200 Anesthesia of skin: Secondary | ICD-10-CM

## 2012-08-09 DIAGNOSIS — R209 Unspecified disturbances of skin sensation: Secondary | ICD-10-CM

## 2012-08-09 DIAGNOSIS — R300 Dysuria: Secondary | ICD-10-CM | POA: Insufficient documentation

## 2012-08-09 LAB — CBC WITH DIFFERENTIAL/PLATELET
Basophils Absolute: 0 10*3/uL (ref 0.0–0.1)
Eosinophils Absolute: 0.2 10*3/uL (ref 0.0–0.7)
Hemoglobin: 13.4 g/dL (ref 12.0–15.0)
Lymphocytes Relative: 46.7 % — ABNORMAL HIGH (ref 12.0–46.0)
MCHC: 32.8 g/dL (ref 30.0–36.0)
Neutro Abs: 1.4 10*3/uL (ref 1.4–7.7)
Neutrophils Relative %: 41.2 % — ABNORMAL LOW (ref 43.0–77.0)
Platelets: 248 10*3/uL (ref 150.0–400.0)
RDW: 16.1 % — ABNORMAL HIGH (ref 11.5–14.6)

## 2012-08-09 LAB — POCT URINALYSIS DIPSTICK
Protein, UA: NEGATIVE
Spec Grav, UA: 1.02
Urobilinogen, UA: 0.2

## 2012-08-09 LAB — COMPREHENSIVE METABOLIC PANEL
ALT: 18 U/L (ref 0–35)
Albumin: 3.8 g/dL (ref 3.5–5.2)
CO2: 27 mEq/L (ref 19–32)
Calcium: 9 mg/dL (ref 8.4–10.5)
Chloride: 102 mEq/L (ref 96–112)
GFR: 115.2 mL/min (ref >60.00)
Potassium: 4.1 mEq/L (ref 3.5–5.1)
Sodium: 138 mEq/L (ref 135–145)
Total Protein: 8.1 g/dL (ref 6.0–8.3)

## 2012-08-09 MED ORDER — ALBUTEROL SULFATE HFA 108 (90 BASE) MCG/ACT IN AERS: 2.0000 | INHALATION_SPRAY | RESPIRATORY_TRACT | Status: DC | PRN

## 2012-08-09 NOTE — Assessment & Plan Note (Signed)
Mild chronic dysuria and noted. Urinalysis today remarkable only for trace leukocytes. Will send urine for culture. Suspect symptoms most likely related to atrophic vaginitis.

## 2012-08-09 NOTE — Assessment & Plan Note (Signed)
History of vitamin D deficiency on high-dose vitamin D supplementation. Will check level with labs today.

## 2012-08-09 NOTE — Assessment & Plan Note (Signed)
Symptoms of bilateral hand numbness at night seem most consistent with carpal tunnel syndrome. Will have patient apply wrist braces bilaterally at bedtime. However, given some atypical symptoms, we'll also set up neurology evaluation for consideration of EMG testing. Will also check TSH, B12 with labs today.

## 2012-08-09 NOTE — Progress Notes (Signed)
Subjective:    Patient ID: Hannah Robinson, female    DOB: [birth year], 47 y.o.   MRN: [id number]  HPI 47 year old female with history of asthma presents for acute visit complaining of several month history of intermittent bilateral hand numbness. She reports that symptoms are most prominent at night. They occasionally wake her from sleep. Described as numbness in her fingertips. Also occasionally has soreness in her left antecubital fossa. Denies any recent change in activity. Denies any other symptoms such as fatigue, fever, chills. Notes a history in the past of carpal tunnel but has never had surgery to correct this.  Also concerned today about several month history of intermittent dysuria. Denies any fever, chills, flank pain, hematuria. Symptoms described as occasional urgency.  Also concerned today about several month history of intermittent fatigue. Denies any focal symptoms such as shortness of breath, chest pain, change in bowel habits, change in appetite.   Outpatient Encounter Prescriptions as of 08/09/2012  Medication Sig Dispense Refill  . albuterol (PROAIR HFA) 108 (90 BASE) MCG/ACT inhaler Inhale 2 puffs into the lungs every 4 (four) hours as needed for wheezing.  1 Inhaler  5  . budesonide-formoterol (SYMBICORT) 80-4.5 MCG/ACT inhaler Inhale 2 puffs into the lungs 2 (two) times daily.  1 Inhaler  0  . cetirizine (ZYRTEC) 10 MG tablet Take 10 mg by mouth daily.      . ergocalciferol (VITAMIN D2) 50000 UNITS capsule Take 50,000 Units by mouth once a week.      . famotidine (PEPCID) 20 MG tablet Take 20 mg by mouth daily.      . fluticasone (VERAMYST) 27.5 MCG/SPRAY nasal spray Place 2 sprays into the nose daily.  10 g  3  . Sodium Chloride-Sodium Bicarb (CLASSIC NETI POT SINUS WASH NA) Use as directed twice daily as needed       BP 118/70  Pulse 64  Temp 98.1 F (36.7 C) (Oral)  Ht 5\' 6"  (1.676 m)  Wt 237 lb 8 oz (107.729 kg)  BMI 38.33 kg/m2  SpO2 99%  Review of Systems    Constitutional: Positive for fatigue. Negative for fever, chills, appetite change and unexpected weight change.  HENT: Negative for ear pain, congestion, sore throat, trouble swallowing, neck pain, voice change and sinus pressure.   Eyes: Negative for visual disturbance.  Respiratory: Negative for cough, shortness of breath, wheezing and stridor.   Cardiovascular: Negative for chest pain, palpitations and leg swelling.  Gastrointestinal: Negative for nausea, vomiting, abdominal pain, diarrhea, constipation, blood in stool, abdominal distention and anal bleeding.  Genitourinary: Negative for dysuria and flank pain.  Musculoskeletal: Negative for myalgias, arthralgias and gait problem.  Skin: Negative for color change and rash.  Neurological: Positive for numbness. Negative for dizziness, weakness and headaches.  Hematological: Negative for adenopathy. Does not bruise/bleed easily.  Psychiatric/Behavioral: Negative for suicidal ideas, sleep disturbance and dysphoric mood. The patient is not nervous/anxious.        Objective:   Physical Exam  Constitutional: She is oriented to person, place, and time. She appears well-developed and well-nourished. No distress.  HENT:  Head: Normocephalic and atraumatic.  Right Ear: External ear normal.  Left Ear: External ear normal.  Nose: Nose normal.  Mouth/Throat: Oropharynx is clear and moist. No oropharyngeal exudate.  Eyes: Conjunctivae normal are normal. Pupils are equal, round, and reactive to light. Right eye exhibits no discharge. Left eye exhibits no discharge. No scleral icterus.  Neck: Normal range of motion. Neck supple. No tracheal deviation present. No  thyromegaly present.  Cardiovascular: Normal rate, regular rhythm, normal heart sounds and intact distal pulses.  Exam reveals no gallop and no friction rub.   No murmur heard. Pulmonary/Chest: Effort normal and breath sounds normal. No respiratory distress. She has no wheezes. She has no  rales. She exhibits no tenderness.  Musculoskeletal: Normal range of motion. She exhibits no edema and no tenderness.  Lymphadenopathy:    She has no cervical adenopathy.  Neurological: She is alert and oriented to person, place, and time. No cranial nerve deficit or sensory deficit. She exhibits normal muscle tone. Coordination normal.  Skin: Skin is warm and dry. No rash noted. She is not diaphoretic. No erythema. No pallor.  Psychiatric: She has a normal mood and affect. Her behavior is normal. Judgment and thought content normal.          Assessment & Plan:

## 2012-08-10 ENCOUNTER — Encounter: Payer: Self-pay | Admitting: *Deleted

## 2012-08-10 ENCOUNTER — Telehealth: Payer: Self-pay | Admitting: Internal Medicine

## 2012-08-10 LAB — VITAMIN D 25 HYDROXY (VIT D DEFICIENCY, FRACTURES): Vit D, 25-Hydroxy: 27 ng/mL — ABNORMAL LOW (ref 30–89)

## 2012-08-10 LAB — URINE CULTURE: Colony Count: 6000

## 2012-08-10 NOTE — Telephone Encounter (Signed)
Spoke with patient and advised results letter mailed to patients home address with results.  

## 2012-08-10 NOTE — Telephone Encounter (Signed)
Wanting lab results

## 2012-09-02 LAB — HM PAP SMEAR

## 2012-09-07 ENCOUNTER — Telehealth: Payer: Self-pay | Admitting: Pulmonary Disease

## 2012-09-07 MED ORDER — FLUTICASONE FUROATE 27.5 MCG/SPRAY NA SUSP: 2.0000 | Freq: Every day | NASAL | Status: DC

## 2012-09-07 NOTE — Telephone Encounter (Signed)
Rx has been called in, pt is aware. 

## 2012-09-09 ENCOUNTER — Encounter: Payer: Federal, State, Local not specified - PPO | Admitting: Internal Medicine

## 2012-09-27 ENCOUNTER — Telehealth: Payer: Self-pay | Admitting: Pulmonary Disease

## 2012-09-27 DIAGNOSIS — J45909 Unspecified asthma, uncomplicated: Secondary | ICD-10-CM

## 2012-09-27 MED ORDER — BUDESONIDE-FORMOTEROL FUMARATE 80-4.5 MCG/ACT IN AERO: 2.0000 | INHALATION_SPRAY | Freq: Two times a day (BID) | RESPIRATORY_TRACT | Status: DC

## 2012-09-27 NOTE — Telephone Encounter (Signed)
Spoke with patient requesting sample of symbicort. Sample given to [facility] to take to [place] office in the AM. Patient aware and nothing further needed at this time.

## 2012-10-31 LAB — HM MAMMOGRAPHY

## 2012-11-25 ENCOUNTER — Encounter: Payer: Self-pay | Admitting: Internal Medicine

## 2013-01-05 ENCOUNTER — Telehealth: Payer: Self-pay | Admitting: Pulmonary Disease

## 2013-01-05 MED ORDER — BUDESONIDE-FORMOTEROL FUMARATE 80-4.5 MCG/ACT IN AERO: 2.0000 | INHALATION_SPRAY | Freq: Two times a day (BID) | RESPIRATORY_TRACT | Status: DC

## 2013-01-05 NOTE — Telephone Encounter (Signed)
Pt aware 2 samples will be sent out to [place] when nurse goes out on Tuesday. Nothing further was needed

## 2013-01-23 ENCOUNTER — Ambulatory Visit: Payer: Self-pay

## 2013-01-23 LAB — URINALYSIS, COMPLETE

## 2013-01-25 LAB — URINE CULTURE

## 2013-03-27 ENCOUNTER — Other Ambulatory Visit: Payer: Self-pay | Admitting: Internal Medicine

## 2013-03-29 ENCOUNTER — Telehealth: Payer: Self-pay | Admitting: Pulmonary Disease

## 2013-03-29 MED ORDER — BUDESONIDE-FORMOTEROL FUMARATE 80-4.5 MCG/ACT IN AERO: 2.0000 | INHALATION_SPRAY | Freq: Two times a day (BID) | RESPIRATORY_TRACT | Status: DC

## 2013-03-29 NOTE — Telephone Encounter (Signed)
LM with pt to call back. Has not been seen since 01/2012. Pt will need OV.

## 2013-03-29 NOTE — Telephone Encounter (Signed)
Returning call.Hannah Robinson ° °

## 2013-03-29 NOTE — Telephone Encounter (Signed)
Pt returned call.  Spoke with patient and advised that she is overdue for appt and that this will need to be scheduled >> appt scheduled with BQ 9.23.14 @ [facility] in [place].  Pt asked "so what - are y'all just going to let me suffer until my appt?"  Advised patient that no, we will absolutely not "let her suffer" until her appt.  Advised that we will be happy to send an rx for her symbicort and provide a sample if available.  Pt okay with this and requests rx [facility] [place] >> done.  [facility] office, spoke with BQ's nurse Lillia Abed who reported no samples in [place] office today.  LMOM TCB x1 to inform pt that [place] has no samples.  She may have ONE sample next week when we return to that office if she would like.  Rx for Symbicort has been sent to verified pharmacy.

## 2013-03-29 NOTE — Telephone Encounter (Signed)
Pt informed that [place] has no samples of Symbicort at this time but she can have one sample next week when we return to that office.  Also notified that rx was sent to pharmacy.

## 2013-04-12 ENCOUNTER — Encounter: Payer: Self-pay | Admitting: Pulmonary Disease

## 2013-04-12 ENCOUNTER — Ambulatory Visit (INDEPENDENT_AMBULATORY_CARE_PROVIDER_SITE_OTHER): Payer: Federal, State, Local not specified - PPO | Admitting: Pulmonary Disease

## 2013-04-12 ENCOUNTER — Encounter: Payer: Self-pay | Admitting: *Deleted

## 2013-04-12 VITALS — BP 126/72 | HR 76 | Temp 97.7°F

## 2013-04-12 DIAGNOSIS — J45909 Unspecified asthma, uncomplicated: Secondary | ICD-10-CM

## 2013-04-12 MED ORDER — BUDESONIDE-FORMOTEROL FUMARATE 80-4.5 MCG/ACT IN AERO: 2.0000 | INHALATION_SPRAY | Freq: Two times a day (BID) | RESPIRATORY_TRACT | Status: DC

## 2013-04-12 NOTE — Progress Notes (Signed)
Subjective:    Patient ID: Hannah Robinson, female    DOB: [birth year], 47 y.o.   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  At that point she was non-compliant with her symbicort.  Her symptoms were consistent with moderate persistent asthma. She states that she underwent allergy testing when she was younger and was told that she was allergic to wheat, corn, grasses, nuts (not peanut), and animal dander.  She also noted heart burn symptoms.  HPI   3/20 ROV -- Initially Hannah Robinson was doing better after last visit, but in the last two weeks she has had more nasal mucus, wheezing, and cough.  She denies fever, chills, or body aches.  She says that she has noticed a scratchy throat as well.  Her son who also has asthma has been wheezing more.  She states that there is no color to mucus, no n/v/d.  She states that prior to this her allergic rhinitis and wheezing improved with the QVAR and nasal meds we wrote on the last visit.  01/30/12 ROV -- since her last visit and has had an episode of bronchitis requiring an antibiotic and prednisone therapy. This occurred around June 1. Since then she's been slowly improving but still notes that she wheezes in the morning when she wakes up and sometimes in the middle the night. The children state they believe that she is wheezing in the middle the night. She states that she has carpet and drapes in her bedroom which she believes are dusty. Her dog also sleeps in the bedroom. 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 But she says that she is able to complete a full days work without having to use the inhaler again until in the middle the night. She did not pick up the Singulair as she had such improvement after last visit and she does not think the Qvar as effective as Symbicort was in the past.  04/12/2013 ROV >> Hannah Robinson has been doing  well since last visit. She says that taking Symbicort regularly has prevented any flares of asthma in the last year. She continues to use the nasal steroid every day and has minimal sinus congestion. And unfortunately her son is sick right now with an asthma flare and so she is worried she might catch whatever he has. She's only had to use her albuterol once in the last several months. In general she has minimal wheezing and feels well on her current regimen. She has not had a flu shot yet and doesn't want to take it today.     Past Medical History  Diagnosis Date  . Childhood asthma   . Urinary tract bacterial infections   . Diabetes mellitus     borderline  . Anemia     iron infusions in past  . Iritis      Review of Systems  Constitutional: Negative for fever, chills, fatigue and unexpected weight change.  HENT: Negative for congestion, sore throat, rhinorrhea, sneezing, postnasal drip and sinus pressure.   Respiratory: Positive for shortness of breath and wheezing. Negative for cough and choking.   Cardiovascular: Negative for chest pain and leg swelling.      Objective:   Physical Exam   Filed Vitals:   04/12/13 1220  BP: 126/72  Pulse: 76  Temp: 97.7 F (36.5 C)  TempSrc: Oral  SpO2:  100%   Gen: well appearing, no acute distress HEENT: NCAT, PERRL, EOMi, OP clear, neck supple without masses; bluish nasal mucosa noted PULM: minimal wheezing R lung, good air movement CV: RRR, no mgr, no JVD AB: BS+, soft, nontender, no hsm Ext: warm, no edema, no clubbing, no cyanosis  08/25/11 Asthma Control Questionnaire: (14/7) 2 (poor control) 08/25/11 PFT: Ratio: 75% FEV1 2.16 (85% pred) 01/2012 Ratio 80%, FEV1 2.2L 88% pred 01/2012 ACQ 14/7  ACQ Questionnaire References:  Juniper EF, O'Byrne PM, Guyatt GH, Pierre Bali DR. Development and validation of a questionnaire to measure asthma control. Eur Respir J 1999; 14: 902-7. Juniper EF, Duane Boston, Mrk AC, Sthl E. Measurement  properties and interpretation of three shortened versions of the asthma control questionnaire. Respir Med 2005; 99: 553-8. Juniper EF, Isabel Caprice ED. Identifying 'well-controlled' and 'not well-controlled' asthma using the Asthma Control Questionnaire. Respir Med 2006; 100: 616- 621    Assessment & Plan:   Asthma This is been a stable interval for Hannah Robinson. She does well with the Symbicort 82 puffs twice a day. She should continue on this medicine indefinitely. She should also continue the Veramist for her allergic rhinitis. She really needs to get a flu shot though she does not want to have it today because she says "it is too early".  Plan: -Continue Symbicort -Continue Veramist - Get a flu shot as soon as possible - Followup with me in one year    Updated Medication List Outpatient Encounter Prescriptions as of 04/12/2013  Medication Sig Dispense Refill  . budesonide-formoterol (SYMBICORT) 80-4.5 MCG/ACT inhaler Inhale 2 puffs into the lungs 2 (two) times daily.  10.2 g  1  . cetirizine (ZYRTEC) 10 MG tablet Take 10 mg by mouth daily as needed.       . famotidine (PEPCID) 20 MG tablet Take 20 mg by mouth daily.      . fluticasone (VERAMYST) 27.5 MCG/SPRAY nasal spray Place 2 sprays into the nose daily.  10 g  3  . PROAIR HFA 108 (90 BASE) MCG/ACT inhaler INHALE 2 PUFFS INTO THE LUNGS EVERY 4 (FOUR) HOURS AS NEEDED FOR WHEEZING.  8.5 each  0  . [DISCONTINUED] ergocalciferol (VITAMIN D2) 50000 UNITS capsule Take 50,000 Units by mouth once a week.      . [DISCONTINUED] famotidine (PEPCID) 20 MG tablet Take 20 mg by mouth daily.      . [DISCONTINUED] Sodium Chloride-Sodium Bicarb (CLASSIC NETI POT SINUS WASH NA) Use as directed twice daily as needed       No facility-administered encounter medications on file as of 04/12/2013.

## 2013-04-12 NOTE — Assessment & Plan Note (Signed)
This is been a stable interval for Hannah Robinson. She does well with the Symbicort 82 puffs twice a day. She should continue on this medicine indefinitely. She should also continue the Veramist for her allergic rhinitis. She really needs to get a flu shot though she does not want to have it today because she says "it is too early".  Plan: -Continue Symbicort -Continue Veramist - Get a flu shot as soon as possible - Followup with me in one year

## 2013-04-12 NOTE — Patient Instructions (Signed)
Take the Symbicort 80/4.5 twice a day no matter how you feel Use the veramist every day Get a flu shot Follow up with [place] in one year or sooner if needed

## 2013-05-11 ENCOUNTER — Telehealth: Payer: Self-pay | Admitting: Internal Medicine

## 2013-05-11 NOTE — Telephone Encounter (Signed)
Asking if we have sample of Symbicort 80

## 2013-05-11 NOTE — Telephone Encounter (Signed)
Patient informed sample left upfront for pick up

## 2013-05-20 ENCOUNTER — Other Ambulatory Visit: Payer: Self-pay | Admitting: Internal Medicine

## 2013-05-23 NOTE — Telephone Encounter (Signed)
Refill? LAst visit acute 1/14

## 2013-06-06 ENCOUNTER — Telehealth: Payer: Self-pay | Admitting: *Deleted

## 2013-06-06 NOTE — Telephone Encounter (Signed)
Patient left message on voicemail requesting samples of Symbicort.

## 2013-06-06 NOTE — Telephone Encounter (Signed)
Fine to give samples if we have them 

## 2013-06-07 NOTE — Telephone Encounter (Signed)
Pt calling back wanting to know about getting those samples ??

## 2013-06-07 NOTE — Telephone Encounter (Signed)
Called and informed patient sample has been left upfront for her to pick up

## 2013-07-22 ENCOUNTER — Ambulatory Visit: Payer: Federal, State, Local not specified - PPO | Admitting: Adult Health

## 2013-07-26 ENCOUNTER — Ambulatory Visit (INDEPENDENT_AMBULATORY_CARE_PROVIDER_SITE_OTHER): Payer: Federal, State, Local not specified - PPO | Admitting: Internal Medicine

## 2013-07-26 ENCOUNTER — Encounter: Payer: Self-pay | Admitting: Internal Medicine

## 2013-07-26 ENCOUNTER — Encounter (INDEPENDENT_AMBULATORY_CARE_PROVIDER_SITE_OTHER): Payer: Self-pay

## 2013-07-26 VITALS — BP 110/70 | HR 80 | Temp 97.9°F | Ht 66.0 in | Wt 248.0 lb

## 2013-07-26 DIAGNOSIS — R3915 Urgency of urination: Secondary | ICD-10-CM

## 2013-07-26 DIAGNOSIS — H20023 Recurrent acute iridocyclitis, bilateral: Secondary | ICD-10-CM

## 2013-07-26 DIAGNOSIS — L309 Dermatitis, unspecified: Secondary | ICD-10-CM

## 2013-07-26 DIAGNOSIS — H20029 Recurrent acute iridocyclitis, unspecified eye: Secondary | ICD-10-CM | POA: Insufficient documentation

## 2013-07-26 DIAGNOSIS — N390 Urinary tract infection, site not specified: Secondary | ICD-10-CM | POA: Insufficient documentation

## 2013-07-26 DIAGNOSIS — L259 Unspecified contact dermatitis, unspecified cause: Secondary | ICD-10-CM

## 2013-07-26 LAB — POCT URINALYSIS DIPSTICK
BILIRUBIN UA: NEGATIVE
Glucose, UA: NEGATIVE
KETONES UA: NEGATIVE
NITRITE UA: POSITIVE
PH UA: 6
Protein, UA: 30
Spec Grav, UA: 1.025
Urobilinogen, UA: 0.2

## 2013-07-26 MED ORDER — CIPROFLOXACIN HCL 500 MG PO TABS: 500.0000 mg | ORAL_TABLET | Freq: Two times a day (BID) | ORAL | Status: DC

## 2013-07-26 NOTE — Patient Instructions (Signed)
Start Vanicream applied to skin 1-2 times daily to help with dryness.

## 2013-07-26 NOTE — Progress Notes (Signed)
Subjective:    Patient ID: Hannah Robinson, female    DOB: 06/15/1966, 48 y.o.   MRN: [id number]  HPI 48YO female presents for acute visit. Notes urinary urgency, and frequency. No dysuria or hematuria. No fever, chills, flank pain. Mild low back pain. No vaginal discharge. Some improvement with limiting caffeine. No meds taken for symptoms.  Also concerned about try it she rash over the back of her neck. This is been present for several days. She denies use of any new lotions, soaps, detergents. She is not currently applying anything to the rash.  Outpatient Encounter Prescriptions as of 07/26/2013  Medication Sig  . budesonide-formoterol (SYMBICORT) 80-4.5 MCG/ACT inhaler Inhale 2 puffs into the lungs 2 (two) times daily.  . cetirizine (ZYRTEC) 10 MG tablet Take 10 mg by mouth daily as needed.   . famotidine (PEPCID) 20 MG tablet Take 20 mg by mouth daily.  . fluticasone (VERAMYST) 27.5 MCG/SPRAY nasal spray Place 2 sprays into the nose daily.  . homatropine 5 % ophthalmic solution Place 1 drop into the left eye 2 (two) times daily.  . Multiple Vitamins-Minerals (WOMENS MULTIVITAMIN PLUS PO) Take by mouth.  . prednisoLONE acetate (PRED FORTE) 1 % ophthalmic suspension Place 1 drop into the left eye 4 (four) times daily.  . predniSONE (DELTASONE) 20 MG tablet Take 20 mg by mouth 2 (two) times daily with a meal.  . PROAIR HFA 108 (90 BASE) MCG/ACT inhaler INHALE 2 PUFFS INTO THE LUNGS EVERY 4 (FOUR) HOURS AS NEEDED FOR WHEEZING.   BP 110/70  Pulse 80  Temp(Src) 97.9 F (36.6 C) (Oral)  Ht 5\' 6"  (1.676 m)  Wt 248 lb (112.492 kg)  BMI 40.05 kg/m2  SpO2 98%    Review of Systems  Constitutional: Negative for fever, chills and fatigue.  Gastrointestinal: Negative for nausea, vomiting, abdominal pain, diarrhea, constipation and rectal pain.  Genitourinary: Positive for urgency and frequency. Negative for dysuria, hematuria, flank pain, decreased urine volume, vaginal bleeding, vaginal  discharge, difficulty urinating, vaginal pain and pelvic pain.  Skin: Positive for rash.       Objective:   Physical Exam  Constitutional: She is oriented to person, place, and time. She appears well-developed and well-nourished. No distress.  HENT:  Head: Normocephalic and atraumatic.  Right Ear: External ear normal.  Left Ear: External ear normal.  Nose: Nose normal.  Mouth/Throat: Oropharynx is clear and moist. No oropharyngeal exudate.  Eyes: Conjunctivae are normal. Pupils are equal, round, and reactive to light. Right eye exhibits no discharge. Left eye exhibits no discharge. No scleral icterus.  Neck: Normal range of motion. Neck supple. No tracheal deviation present. No thyromegaly present.  Cardiovascular: Normal rate, regular rhythm, normal heart sounds and intact distal pulses.  Exam reveals no gallop and no friction rub.   No murmur heard. Pulmonary/Chest: Effort normal and breath sounds normal. No accessory muscle usage. Not tachypneic. No respiratory distress. She has no decreased breath sounds. She has no wheezes. She has no rhonchi. She has no rales. She exhibits no tenderness.  Abdominal: There is no tenderness (no CVA tenderness).  Musculoskeletal: Normal range of motion. She exhibits no edema and no tenderness.  Lymphadenopathy:    She has no cervical adenopathy.  Neurological: She is alert and oriented to person, place, and time. No cranial nerve deficit. She exhibits normal muscle tone. Coordination normal.  Skin: Skin is warm and dry. No rash noted. She is not diaphoretic. No erythema. No pallor.     Psychiatric:  She has a normal mood and affect. Her behavior is normal. Judgment and thought content normal.          Assessment & Plan:

## 2013-07-26 NOTE — Assessment & Plan Note (Signed)
Symptoms and urinalysis consistent with urinary tract infection. Will send urine for culture. Start Cipro twice daily. Pt will call if any persistent or worsening symptoms.

## 2013-07-26 NOTE — Assessment & Plan Note (Signed)
Exam most consistent with dry skin. Encouraged use of emollient creams such as Vanicream. We also discussed use of hydrocortisone cream, however encouraged her to use this sparingly. Follow up if symptoms not improving.

## 2013-07-26 NOTE — Progress Notes (Signed)
Pre-visit discussion using our clinic review tool. No additional management support is needed unless otherwise documented below in the visit note.  

## 2013-07-26 NOTE — Assessment & Plan Note (Signed)
Patient currently being treated for iritis by ophthalmology. Will request notes.

## 2013-07-29 ENCOUNTER — Other Ambulatory Visit: Payer: Self-pay | Admitting: Internal Medicine

## 2013-07-29 LAB — CULTURE, URINE COMPREHENSIVE: Colony Count: >=100000

## 2013-07-29 MED ORDER — SULFAMETHOXAZOLE-TMP DS 800-160 MG PO TABS: 1.0000 | ORAL_TABLET | Freq: Two times a day (BID) | ORAL | Status: DC

## 2013-07-29 NOTE — Addendum Note (Signed)
Addended by: Ronna PolioWALKER, Delila Kuklinski A on: 07/29/2013 10:56 AM   Modules accepted: Orders

## 2013-08-02 ENCOUNTER — Telehealth: Payer: Self-pay | Admitting: *Deleted

## 2013-08-02 NOTE — Telephone Encounter (Signed)
Patient walked in the clinic on 07/18/13 requesting to leave an urine sample. State she was told by her doctor she could just stop by and drop off an urine sample anytime she felt like she was getting an UTI. Informed patient Dr. Dan HumphreysWalker was not in the office on this day and no other provider had any openings. Also she can not just leave an urine sample without being seen by a provider. Per patient she would call back to schedule an appointment or go to an urgent care to be evaluated.

## 2013-08-04 ENCOUNTER — Other Ambulatory Visit: Payer: Self-pay | Admitting: Internal Medicine

## 2013-09-02 ENCOUNTER — Ambulatory Visit (INDEPENDENT_AMBULATORY_CARE_PROVIDER_SITE_OTHER)
Admission: RE | Admit: 2013-09-02 | Discharge: 2013-09-02 | Disposition: A | Discharge status: Home / Self Care | Payer: Federal, State, Local not specified - PPO | Source: Ambulatory Visit | Attending: Internal Medicine | Admitting: Internal Medicine

## 2013-09-02 ENCOUNTER — Ambulatory Visit (INDEPENDENT_AMBULATORY_CARE_PROVIDER_SITE_OTHER): Payer: Federal, State, Local not specified - PPO | Admitting: Internal Medicine

## 2013-09-02 ENCOUNTER — Encounter: Payer: Self-pay | Admitting: Internal Medicine

## 2013-09-02 ENCOUNTER — Encounter: Payer: Self-pay | Admitting: *Deleted

## 2013-09-02 ENCOUNTER — Encounter (INDEPENDENT_AMBULATORY_CARE_PROVIDER_SITE_OTHER): Payer: Self-pay

## 2013-09-02 ENCOUNTER — Other Ambulatory Visit (HOSPITAL_COMMUNITY)
Admission: RE | Admit: 2013-09-02 | Discharge: 2013-09-02 | Disposition: A | Discharge status: Home / Self Care | Payer: Federal, State, Local not specified - PPO | Source: Ambulatory Visit | Attending: Internal Medicine | Admitting: Internal Medicine

## 2013-09-02 VITALS — BP 130/90 | HR 83 | Temp 98.0°F | Ht 66.0 in | Wt 245.2 lb

## 2013-09-02 DIAGNOSIS — R3915 Urgency of urination: Secondary | ICD-10-CM

## 2013-09-02 DIAGNOSIS — N390 Urinary tract infection, site not specified: Secondary | ICD-10-CM

## 2013-09-02 DIAGNOSIS — Z01419 Encounter for gynecological examination (general) (routine) without abnormal findings: Secondary | ICD-10-CM | POA: Insufficient documentation

## 2013-09-02 DIAGNOSIS — Z1151 Encounter for screening for human papillomavirus (HPV): Secondary | ICD-10-CM | POA: Insufficient documentation

## 2013-09-02 DIAGNOSIS — H209 Unspecified iridocyclitis: Secondary | ICD-10-CM

## 2013-09-02 DIAGNOSIS — L301 Dyshidrosis [pompholyx]: Secondary | ICD-10-CM

## 2013-09-02 DIAGNOSIS — R252 Cramp and spasm: Secondary | ICD-10-CM

## 2013-09-02 DIAGNOSIS — Z0000 Encounter for general adult medical examination without abnormal findings: Secondary | ICD-10-CM

## 2013-09-02 LAB — POCT URINALYSIS DIPSTICK
Bilirubin, UA: NEGATIVE
Glucose, UA: NEGATIVE
Ketones, UA: NEGATIVE
Leukocytes, UA: NEGATIVE
NITRITE UA: NEGATIVE
PH UA: 5
PROTEIN UA: NEGATIVE
RBC UA: NEGATIVE
Spec Grav, UA: >=1.03
Urobilinogen, UA: 0.2

## 2013-09-02 LAB — COMPREHENSIVE METABOLIC PANEL
ALBUMIN: 3.9 g/dL (ref 3.5–5.2)
ALT: 18 U/L (ref 0–35)
AST: 17 U/L (ref 0–37)
Alkaline Phosphatase: 59 U/L (ref 39–117)
BUN: 19 mg/dL (ref 6–23)
CO2: 24 mEq/L (ref 19–32)
Calcium: 9.4 mg/dL (ref 8.4–10.5)
Chloride: 106 mEq/L (ref 96–112)
Creatinine, Ser: 0.7 mg/dL (ref 0.4–1.2)
GFR: 112.82 mL/min (ref >60.00)
GLUCOSE: 132 mg/dL — AB (ref 70–99)
POTASSIUM: 4.1 meq/L (ref 3.5–5.1)
SODIUM: 141 meq/L (ref 135–145)
TOTAL PROTEIN: 7.7 g/dL (ref 6.0–8.3)
Total Bilirubin: 0.9 mg/dL (ref 0.3–1.2)

## 2013-09-02 LAB — HEMOGLOBIN A1C: Hgb A1c MFr Bld: 6.3 % (ref 4.6–6.5)

## 2013-09-02 LAB — CBC WITH DIFFERENTIAL/PLATELET
Basophils Absolute: 0 10*3/uL (ref 0.0–0.1)
Basophils Relative: 0.4 % (ref 0.0–3.0)
Eosinophils Absolute: 0 10*3/uL (ref 0.0–0.7)
Eosinophils Relative: 0.3 % (ref 0.0–5.0)
HCT: 41.4 % (ref 36.0–46.0)
Hemoglobin: 12.9 g/dL (ref 12.0–15.0)
Lymphocytes Relative: 14.9 % (ref 12.0–46.0)
Lymphs Abs: 1 10*3/uL (ref 0.7–4.0)
MCHC: 31.2 g/dL (ref 30.0–36.0)
MCV: 84.8 fl (ref 78.0–100.0)
Monocytes Absolute: 0.2 10*3/uL (ref 0.1–1.0)
Monocytes Relative: 3.8 % (ref 3.0–12.0)
NEUTROS PCT: 80.6 % — AB (ref 43.0–77.0)
Neutro Abs: 5.2 10*3/uL (ref 1.4–7.7)
PLATELETS: 287 10*3/uL (ref 150.0–400.0)
RBC: 4.88 Mil/uL (ref 3.87–5.11)
RDW: 16.7 % — ABNORMAL HIGH (ref 11.5–14.6)
WBC: 6.5 10*3/uL (ref 4.5–10.5)

## 2013-09-02 LAB — LIPID PANEL
CHOLESTEROL: 192 mg/dL (ref 0–200)
HDL: 90.8 mg/dL (ref >39.00)
LDL Cholesterol: 90 mg/dL (ref 0–99)
TRIGLYCERIDES: 56 mg/dL (ref 0.0–149.0)
Total CHOL/HDL Ratio: 2
VLDL: 11.2 mg/dL (ref 0.0–40.0)

## 2013-09-02 LAB — TSH: TSH: 0.81 u[IU]/mL (ref 0.35–5.50)

## 2013-09-02 MED ORDER — ALBUTEROL SULFATE HFA 108 (90 BASE) MCG/ACT IN AERS: INHALATION_SPRAY | RESPIRATORY_TRACT | Status: DC

## 2013-09-02 NOTE — Progress Notes (Signed)
Subjective:    Patient ID: Hannah Robinson, female    DOB: 10/09/1965, 48 y.o.   MRN: [id number]  HPI 48YO female presents for annual exam. She is generally feeling well. She was recently treated for UTI and culture pos for Staph. She has evaluation scheduled with [facility] urology for further evaluation. She continues to have some urinary urgency, but denies fever, chills, hematuria, flank pain.  Muscle cramping - She is concerned about intermittent right calf pain, described as camping which occurs at night. No leg swelling or change in skin color. No persistent pain. Does not take anything for this.  Skin change - She is also concerned about darkening of her skin on her hands and dry skin. She washes her hands several times per day and uses hand sanitizer. She also wears gloves most of the day. She has not been using any topical treatments for this.  Iritis - Noted in the past by her opthalmologist. He requests additional labs to be performed with her exam today. No recent visual changes.  She is UTD on mammogram, which is due again in 10/2013. She had last PAP in 2014 and reports this was normal, but generally has yearly PAP. She is not following any specific diet or exercise program.    Review of Systems  Constitutional: Negative for fever, chills, appetite change, fatigue and unexpected weight change.  HENT: Negative for congestion, ear pain, sinus pressure, sore throat, trouble swallowing and voice change.   Eyes: Negative for visual disturbance.  Respiratory: Negative for cough, shortness of breath, wheezing and stridor.   Cardiovascular: Negative for chest pain, palpitations and leg swelling.  Gastrointestinal: Negative for nausea, vomiting, abdominal pain, diarrhea, constipation, blood in stool, abdominal distention and anal bleeding.  Genitourinary: Negative for dysuria and flank pain.  Musculoskeletal: Positive for myalgias. Negative for arthralgias, gait problem and neck pain.    Skin: Positive for color change. Negative for rash.  Neurological: Negative for dizziness and headaches.  Hematological: Negative for adenopathy. Does not bruise/bleed easily.  Psychiatric/Behavioral: Negative for suicidal ideas, sleep disturbance and dysphoric mood. The patient is not nervous/anxious.    Outpatient Encounter Prescriptions as of 09/02/2013  Medication Sig  . budesonide-formoterol (SYMBICORT) 80-4.5 MCG/ACT inhaler Inhale 2 puffs into the lungs 2 (two) times daily.  . cetirizine (ZYRTEC) 10 MG tablet Take 10 mg by mouth daily as needed.   . famotidine (PEPCID) 20 MG tablet Take 20 mg by mouth daily.  . fluticasone (VERAMYST) 27.5 MCG/SPRAY nasal spray Place 2 sprays into the nose daily.  . Multiple Vitamins-Minerals (WOMENS MULTIVITAMIN PLUS PO) Take by mouth.  . prednisoLONE acetate (PRED FORTE) 1 % ophthalmic suspension Place 1 drop into the left eye 4 (four) times daily.  . predniSONE (DELTASONE) 20 MG tablet Take 20 mg by mouth 2 (two) times daily with a meal.  . PROAIR HFA 108 (90 BASE) MCG/ACT inhaler INHALE 2 PUFFS EVERY 4 HOURS AS NEEDED   BP 130/90  Pulse 83  Temp(Src) 98 F (36.7 C) (Oral)  Ht 5\' 6"  (1.676 m)  Wt 245 lb 4 oz (111.245 kg)  BMI 39.60 kg/m2  SpO2 99%     Objective:   Physical Exam  Constitutional: She is oriented to person, place, and time. She appears well-developed and well-nourished. No distress.  HENT:  Head: Normocephalic and atraumatic.  Right Ear: External ear normal.  Left Ear: External ear normal.  Nose: Nose normal.  Mouth/Throat: Oropharynx is clear and moist. No oropharyngeal exudate.  Eyes:  Conjunctivae are normal. Pupils are equal, round, and reactive to light. Right eye exhibits no discharge. Left eye exhibits no discharge. No scleral icterus.  Neck: Normal range of motion. Neck supple. No tracheal deviation present. No thyromegaly present.  Cardiovascular: Normal rate, regular rhythm, normal heart sounds and intact distal  pulses.  Exam reveals no gallop and no friction rub.   No murmur heard. Pulmonary/Chest: Effort normal and breath sounds normal. No respiratory distress. She has no wheezes. She has no rales. She exhibits no tenderness.  Abdominal: Soft. Bowel sounds are normal. She exhibits no distension and no mass. There is no tenderness. There is no rebound and no guarding.  Genitourinary: Rectum normal, vagina normal and uterus normal. No breast swelling, tenderness, discharge or bleeding. Pelvic exam was performed with patient supine. There is no rash, tenderness or lesion on the right labia. There is no rash, tenderness or lesion on the left labia. Uterus is not enlarged and not tender. Cervix exhibits no motion tenderness, no discharge and no friability. Right adnexum displays no mass, no tenderness and no fullness. Left adnexum displays no mass, no tenderness and no fullness. No erythema or tenderness around the vagina. No vaginal discharge found.  Limited visualization of cervix secondary to patient movement.  Musculoskeletal: Normal range of motion. She exhibits no edema and no tenderness.  Lymphadenopathy:    She has no cervical adenopathy.  Neurological: She is alert and oriented to person, place, and time. No cranial nerve deficit. She exhibits normal muscle tone. Coordination normal.  Skin: Skin is warm and dry. No rash noted. She is not diaphoretic. No erythema. No pallor.  Hyperpigmentation noted over bilateral MTP joints with dry, cracked skin over fingers.  Psychiatric: She has a normal mood and affect. Her behavior is normal. Judgment and thought content normal.          Assessment & Plan:

## 2013-09-02 NOTE — Patient Instructions (Signed)
Look into coverage for Prevnar given history of asthma

## 2013-09-02 NOTE — Assessment & Plan Note (Signed)
General medical exam including breast and pelvic exam normal today except as noted. PAP pending. Mammogram UTD. Encourage healthy diet and regular exercise. Labs today including CBC, CMP, lipids, A1c. Follow up 6 months and prn.

## 2013-09-02 NOTE — Progress Notes (Signed)
Pre-visit discussion using our clinic review tool. No additional management support is needed unless otherwise documented below in the visit note.  

## 2013-09-02 NOTE — Assessment & Plan Note (Signed)
Symptoms and exam consistent with dyshidrotic eczema. Recommended avoiding hand sanitizer, using gentle soaps. Recommended use of Vasaline or Eucerin cream and Hydrocortisone at night. If symptoms persistent, then would recommend stronger steroid cream.

## 2013-09-02 NOTE — Assessment & Plan Note (Signed)
Persistent urinary urgency. Urinalysis today normal. Will send urine for culture given recent UTI.

## 2013-09-02 NOTE — Addendum Note (Signed)
Addended by: Montine CircleMALDONADO, Eran Windish D on: 09/02/2013 01:53 PM   Modules accepted: Orders

## 2013-09-02 NOTE — Assessment & Plan Note (Signed)
History of iritis. Will get records from opthalmologist. Labs as requested sent today.

## 2013-09-02 NOTE — Assessment & Plan Note (Signed)
Intermittent nocturnal muscle cramping right calf. Exam normal. Will check electrolytes, thyroid function. Discussed some home remedies to help with cramping. If persistent or worsening symptoms, consider vascular evaluation.

## 2013-09-03 LAB — ANGIOTENSIN CONVERTING ENZYME: Angiotensin-Converting Enzyme: 18 U/L (ref 8–52)

## 2013-09-03 LAB — CULTURE, URINE COMPREHENSIVE
Colony Count: NO GROWTH
Organism ID, Bacteria: NO GROWTH

## 2013-09-03 LAB — RPR

## 2013-09-05 ENCOUNTER — Other Ambulatory Visit: Payer: Self-pay | Admitting: Internal Medicine

## 2013-09-05 LAB — SEDIMENTATION RATE: Sed Rate: 19 mm/hr (ref 0–22)

## 2013-09-05 LAB — ANA: ANA: NEGATIVE

## 2013-09-05 LAB — HLA-B27 ANTIGEN

## 2013-09-05 LAB — LYME, TOTAL AB TEST/REFLEX: Lyme IgG/IgM Ab: <0.91 {ISR} (ref 0.00–0.90)

## 2013-09-07 ENCOUNTER — Encounter: Payer: Self-pay | Admitting: *Deleted

## 2013-09-07 LAB — HLA-B27 ANTIGEN: DNA Result:: NOT DETECTED

## 2013-09-09 ENCOUNTER — Telehealth: Payer: Self-pay | Admitting: Internal Medicine

## 2013-09-09 NOTE — Telephone Encounter (Signed)
Would like to know if you could refer her to another urologist. The one she was referred to in January can not see her until March, she does not want to wait that long. She originally had a appointment today but their system was done so they rescheduled her to March.

## 2013-09-09 NOTE — Telephone Encounter (Signed)
Amber, Is there a urologist with an earlier visit?

## 2013-09-12 NOTE — Telephone Encounter (Signed)
It would be a female urologist in [place]. Probably looking at around 2-3 weeks out with [facility]

## 2013-09-22 ENCOUNTER — Telehealth: Payer: Self-pay | Admitting: Internal Medicine

## 2013-09-22 NOTE — Telephone Encounter (Signed)
The patient has a question about her labs . She stated that her RDW and Neutrophils were high and she wanted to know why.

## 2013-09-23 NOTE — Telephone Encounter (Signed)
Her neutrophils may have been elevated because of recent viral infection. Her RDW has been stable. I would consider this normal.

## 2013-09-26 NOTE — Telephone Encounter (Signed)
Patient informed of information from Dr. Dan HumphreysWalker, verbalized understanding.

## 2013-10-06 ENCOUNTER — Other Ambulatory Visit: Payer: Self-pay

## 2013-10-07 NOTE — Telephone Encounter (Signed)
Patient called left a message on my voicemail yesterday. She has been rescheduled several times with urologist office she was referred to and would like to be referred to someone else ASAP. She is upset, stated this is her life and this needs to be taken care of soon. No one ever returned her call the last time and she is getting frustrated.

## 2013-10-07 NOTE — Telephone Encounter (Signed)
Amber, Can you take care of this?

## 2013-10-10 NOTE — Telephone Encounter (Signed)
Spoke with the patient she is seeing there office today at 11am. She wanted to be sure Okey RegalCarol is aware she is very sorry for her VM that was left as she was very upset. If she is still unhappy after today's visit, she will call so we can s/u elsewhere.

## 2013-10-13 DIAGNOSIS — N390 Urinary tract infection, site not specified: Secondary | ICD-10-CM | POA: Insufficient documentation

## 2013-10-13 HISTORY — DX: Urinary tract infection, site not specified: N39.0

## 2013-10-14 ENCOUNTER — Other Ambulatory Visit: Payer: Self-pay

## 2013-10-18 ENCOUNTER — Ambulatory Visit: Payer: Self-pay | Admitting: Urology

## 2013-10-26 ENCOUNTER — Telehealth: Payer: Self-pay | Admitting: Emergency Medicine

## 2013-11-01 ENCOUNTER — Encounter: Payer: Self-pay | Admitting: Internal Medicine

## 2013-11-11 ENCOUNTER — Ambulatory Visit: Payer: Self-pay | Admitting: Obstetrics and Gynecology

## 2013-12-01 ENCOUNTER — Other Ambulatory Visit: Payer: Self-pay

## 2013-12-01 MED ORDER — FLUTICASONE FUROATE 27.5 MCG/SPRAY NA SUSP: 2.0000 | Freq: Every day | NASAL | Status: DC

## 2013-12-08 ENCOUNTER — Ambulatory Visit: Payer: Federal, State, Local not specified - PPO | Admitting: Internal Medicine

## 2013-12-14 ENCOUNTER — Ambulatory Visit: Payer: Federal, State, Local not specified - PPO | Admitting: Internal Medicine

## 2013-12-15 ENCOUNTER — Other Ambulatory Visit: Payer: Federal, State, Local not specified - PPO

## 2013-12-22 ENCOUNTER — Other Ambulatory Visit: Payer: Federal, State, Local not specified - PPO

## 2013-12-23 ENCOUNTER — Other Ambulatory Visit (INDEPENDENT_AMBULATORY_CARE_PROVIDER_SITE_OTHER): Payer: Federal, State, Local not specified - PPO

## 2013-12-23 DIAGNOSIS — R7309 Other abnormal glucose: Secondary | ICD-10-CM

## 2013-12-23 LAB — COMPREHENSIVE METABOLIC PANEL
ALBUMIN: 3.7 g/dL (ref 3.5–5.2)
ALT: 15 U/L (ref 0–35)
AST: 21 U/L (ref 0–37)
Alkaline Phosphatase: 69 U/L (ref 39–117)
BILIRUBIN TOTAL: 0.4 mg/dL (ref 0.2–1.2)
BUN: 12 mg/dL (ref 6–23)
CO2: 28 mEq/L (ref 19–32)
Calcium: 9.1 mg/dL (ref 8.4–10.5)
Chloride: 104 mEq/L (ref 96–112)
Creatinine, Ser: 0.6 mg/dL (ref 0.4–1.2)
GFR: 134.25 mL/min (ref >60.00)
GLUCOSE: 84 mg/dL (ref 70–99)
POTASSIUM: 3.7 meq/L (ref 3.5–5.1)
Sodium: 139 mEq/L (ref 135–145)
Total Protein: 7.4 g/dL (ref 6.0–8.3)

## 2013-12-23 LAB — HEMOGLOBIN A1C: HEMOGLOBIN A1C: 6 % (ref 4.6–6.5)

## 2014-02-17 IMAGING — CR DG CHEST 2V
1 series · 2 of 2 positions shown · non-contrast
Comparison: none

REASON FOR EXAM: wheezing
COMMENTS:   May transport without cardiac monitor

PROCEDURE:     DXR - DXR CHEST PA (OR AP) AND LATERAL  - December 07, 2011  [DATE]
RESULT:     Comparison: 06/05/2009

[Series 1: pa · 0.17mm/px · 2 of 2 slices shown]
[im 1/2]
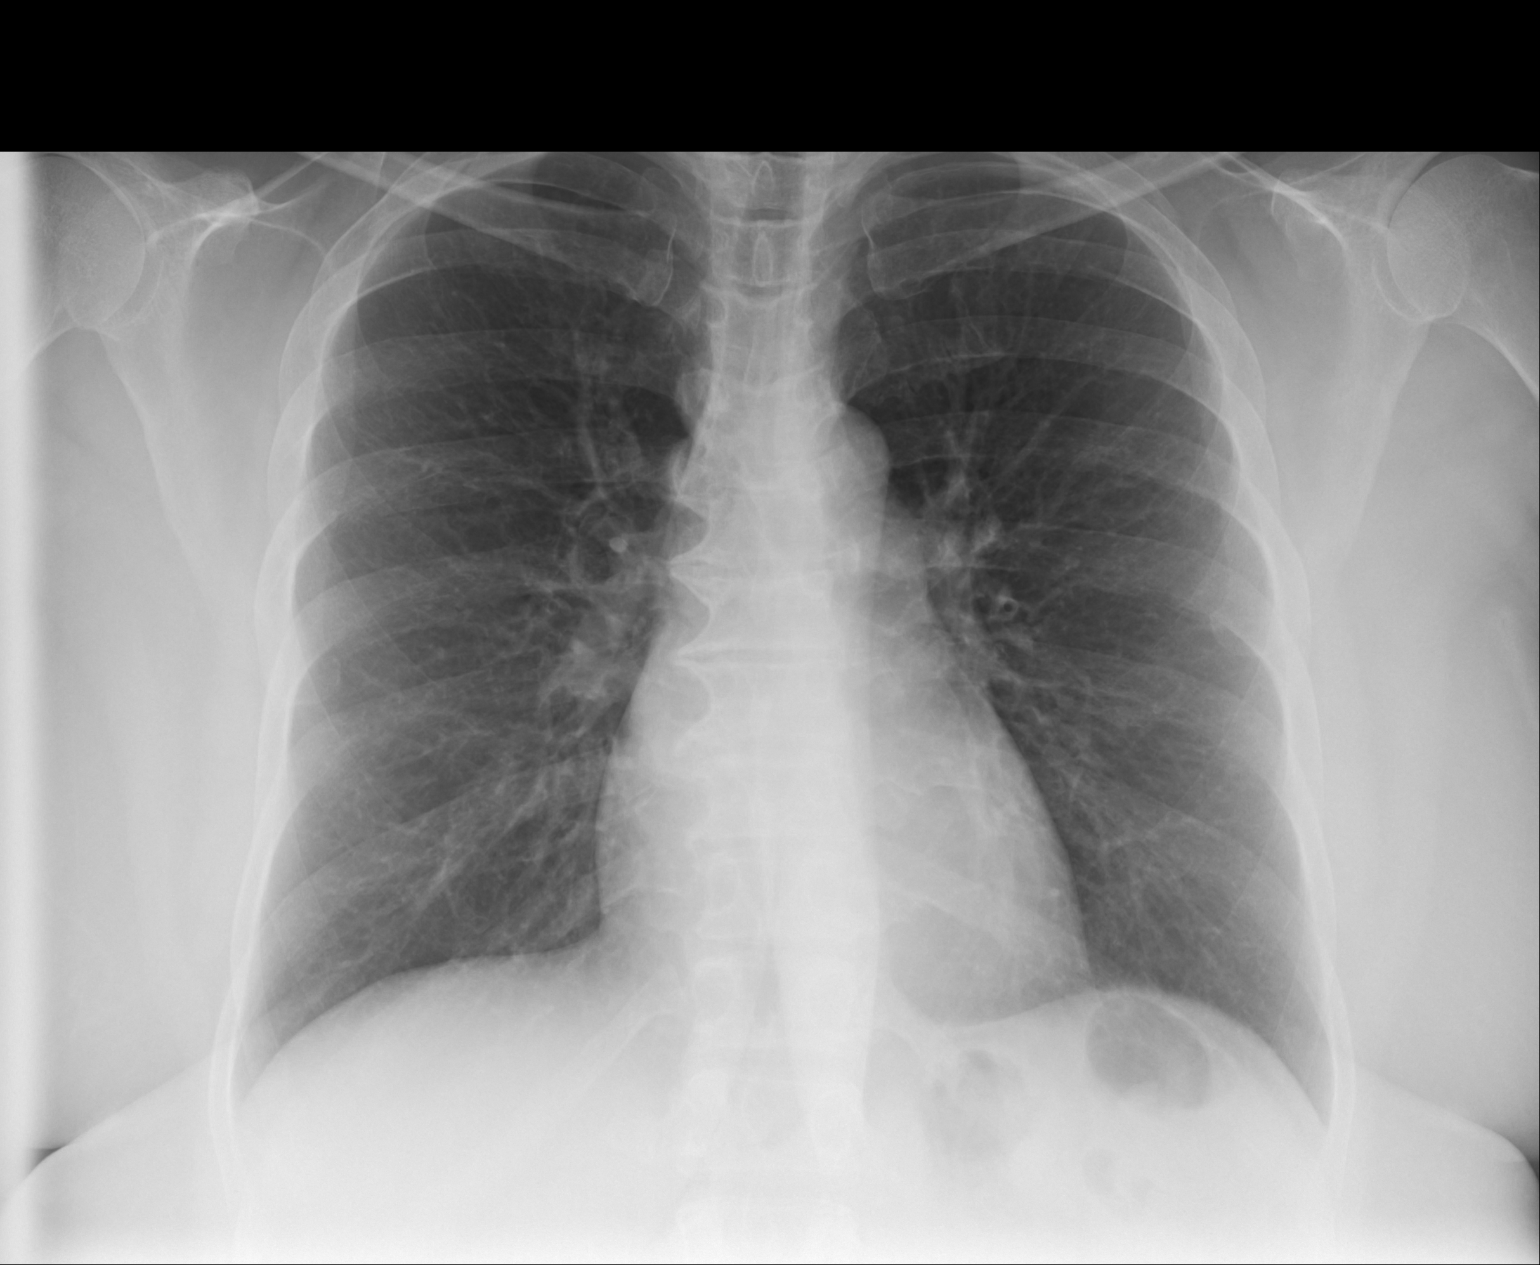
[im 2/2]
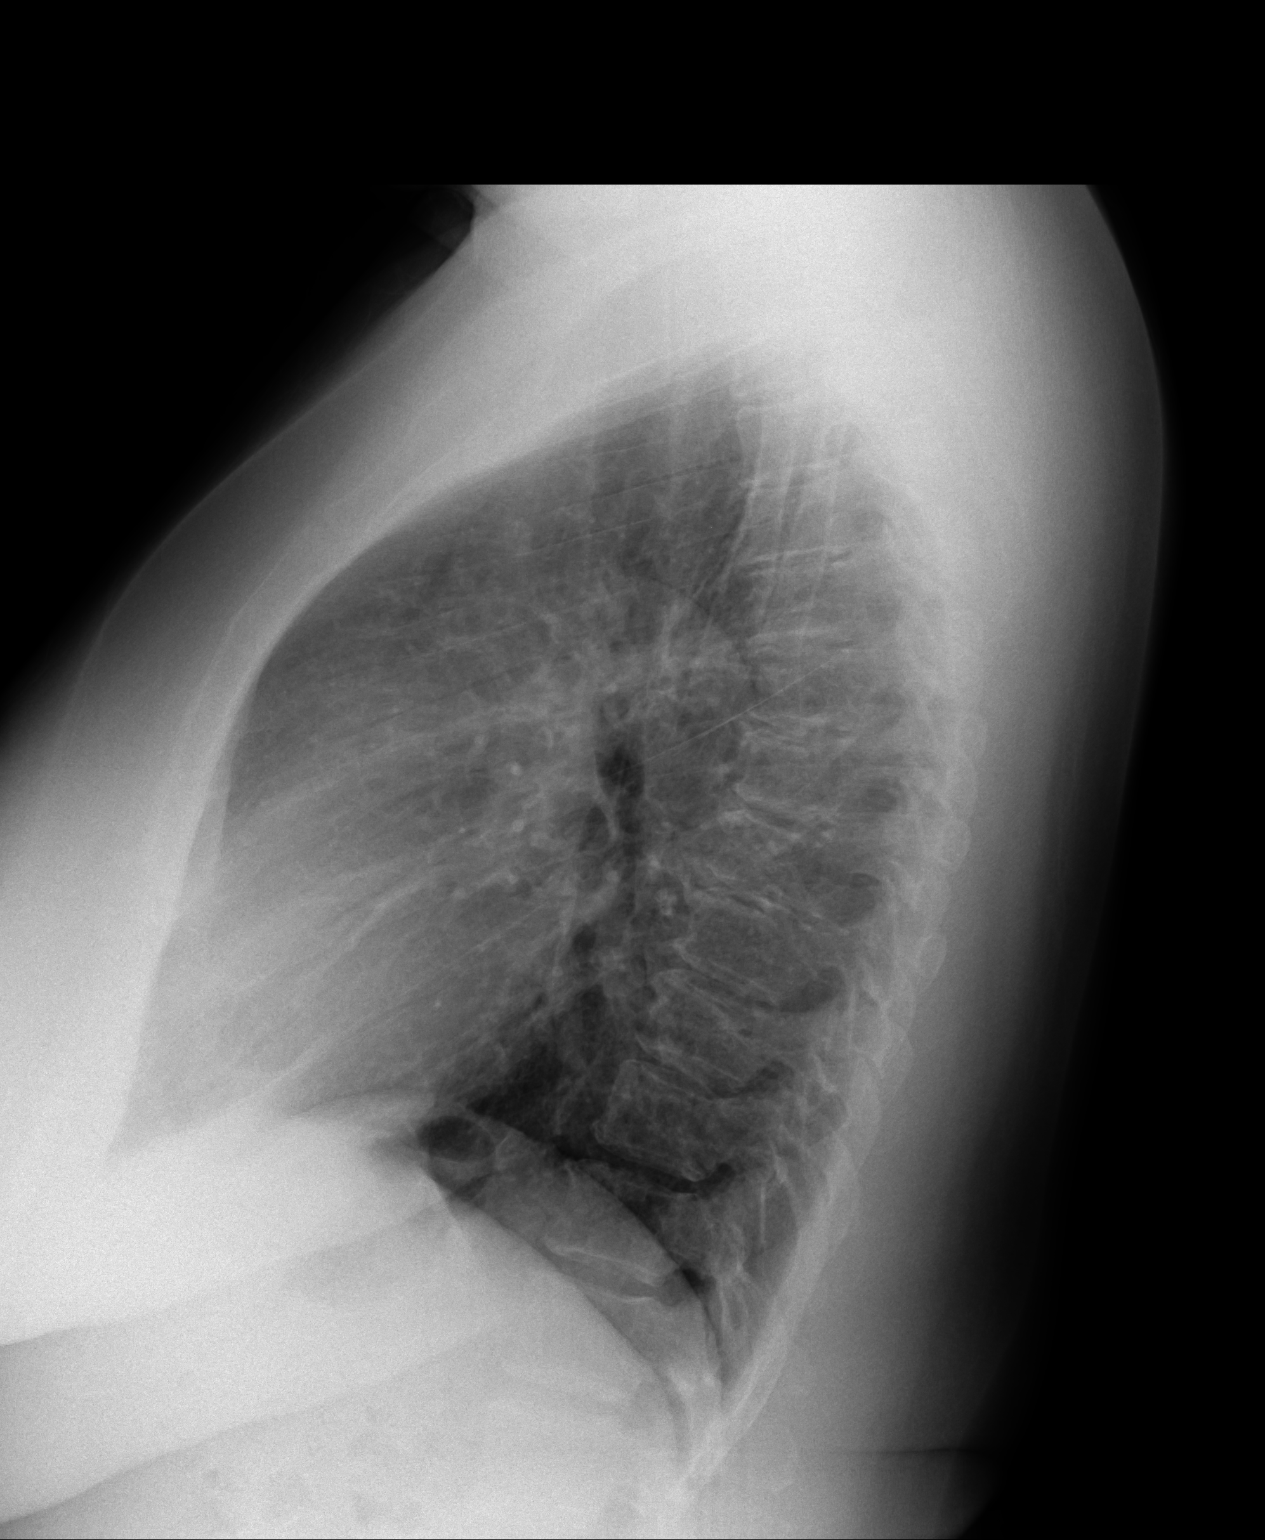

[2 of 2 positions shown; findings below may reference images not displayed]

FINDINGS: The heart and mediastinum are within normal limits. No focal pulmonary
opacities.
IMPRESSION: No acute cardiopulmonary disease.

## 2014-08-19 ENCOUNTER — Other Ambulatory Visit: Payer: Self-pay | Admitting: Internal Medicine

## 2014-09-27 ENCOUNTER — Telehealth: Payer: Self-pay | Admitting: Pulmonary Disease

## 2014-09-27 NOTE — Telephone Encounter (Signed)
Left message with [facility] letting them know that we can't refill medication at this time. Pt needs ROV.

## 2014-10-01 ENCOUNTER — Other Ambulatory Visit: Payer: Self-pay | Admitting: Internal Medicine

## 2014-10-02 NOTE — Telephone Encounter (Signed)
Appt 10/24/14

## 2014-10-04 ENCOUNTER — Other Ambulatory Visit: Payer: Self-pay | Admitting: *Deleted

## 2014-10-04 MED ORDER — FLUTICASONE FUROATE 27.5 MCG/SPRAY NA SUSP
2.0000 | Freq: Every day | NASAL | Status: DC
Start: 2014-10-04 — End: 2014-11-04

## 2014-10-24 ENCOUNTER — Encounter: Payer: Federal, State, Local not specified - PPO | Admitting: Internal Medicine

## 2014-10-25 ENCOUNTER — Other Ambulatory Visit: Payer: Self-pay | Admitting: Internal Medicine

## 2014-10-25 NOTE — Telephone Encounter (Signed)
Appt 12/04/14 

## 2014-10-28 ENCOUNTER — Other Ambulatory Visit: Payer: Self-pay | Admitting: Internal Medicine

## 2014-11-04 ENCOUNTER — Other Ambulatory Visit: Payer: Self-pay | Admitting: Internal Medicine

## 2014-11-06 NOTE — Telephone Encounter (Signed)
Appt 12/04/14 

## 2014-11-24 ENCOUNTER — Other Ambulatory Visit: Payer: Federal, State, Local not specified - PPO

## 2014-12-04 ENCOUNTER — Encounter: Payer: Self-pay | Admitting: Internal Medicine

## 2014-12-04 ENCOUNTER — Ambulatory Visit (INDEPENDENT_AMBULATORY_CARE_PROVIDER_SITE_OTHER): Payer: Federal, State, Local not specified - PPO | Admitting: Internal Medicine

## 2014-12-04 VITALS — BP 132/82 | HR 62 | Temp 97.7°F | Ht 65.5 in | Wt 248.5 lb

## 2014-12-04 DIAGNOSIS — Z6830 Body mass index (BMI) 30.0-30.9, adult: Secondary | ICD-10-CM | POA: Insufficient documentation

## 2014-12-04 DIAGNOSIS — Z0000 Encounter for general adult medical examination without abnormal findings: Secondary | ICD-10-CM

## 2014-12-04 DIAGNOSIS — Z23 Encounter for immunization: Secondary | ICD-10-CM

## 2014-12-04 DIAGNOSIS — B372 Candidiasis of skin and nail: Secondary | ICD-10-CM | POA: Insufficient documentation

## 2014-12-04 LAB — CBC WITH DIFFERENTIAL/PLATELET
Basophils Absolute: 0 10*3/uL (ref 0.0–0.1)
Basophils Relative: 0.6 % (ref 0.0–3.0)
EOS ABS: 0.3 10*3/uL (ref 0.0–0.7)
Eosinophils Relative: 7 % — ABNORMAL HIGH (ref 0.0–5.0)
HEMATOCRIT: 38.8 % (ref 36.0–46.0)
HEMOGLOBIN: 12.9 g/dL (ref 12.0–15.0)
LYMPHS PCT: 46.6 % — AB (ref 12.0–46.0)
Lymphs Abs: 1.8 10*3/uL (ref 0.7–4.0)
MCHC: 33.3 g/dL (ref 30.0–36.0)
MCV: 79.1 fl (ref 78.0–100.0)
Monocytes Absolute: 0.3 10*3/uL (ref 0.1–1.0)
Monocytes Relative: 7.6 % (ref 3.0–12.0)
NEUTROS ABS: 1.5 10*3/uL (ref 1.4–7.7)
NEUTROS PCT: 38.2 % — AB (ref 43.0–77.0)
PLATELETS: 231 10*3/uL (ref 150.0–400.0)
RBC: 4.91 Mil/uL (ref 3.87–5.11)
RDW: 15.8 % — AB (ref 11.5–15.5)
WBC: 3.8 10*3/uL — ABNORMAL LOW (ref 4.0–10.5)

## 2014-12-04 LAB — LIPID PANEL
CHOLESTEROL: 151 mg/dL (ref 0–200)
HDL: 68.5 mg/dL (ref >39.00)
LDL Cholesterol: 76 mg/dL (ref 0–99)
NonHDL: 82.5
Total CHOL/HDL Ratio: 2
Triglycerides: 34 mg/dL (ref 0.0–149.0)
VLDL: 6.8 mg/dL (ref 0.0–40.0)

## 2014-12-04 LAB — COMPREHENSIVE METABOLIC PANEL
ALBUMIN: 3.6 g/dL (ref 3.5–5.2)
ALT: 15 U/L (ref 0–35)
AST: 19 U/L (ref 0–37)
Alkaline Phosphatase: 82 U/L (ref 39–117)
BUN: 13 mg/dL (ref 6–23)
CHLORIDE: 105 meq/L (ref 96–112)
CO2: 29 mEq/L (ref 19–32)
Calcium: 9.1 mg/dL (ref 8.4–10.5)
Creatinine, Ser: 0.62 mg/dL (ref 0.40–1.20)
GFR: 131.24 mL/min (ref >60.00)
GLUCOSE: 84 mg/dL (ref 70–99)
POTASSIUM: 4.2 meq/L (ref 3.5–5.1)
Sodium: 138 mEq/L (ref 135–145)
TOTAL PROTEIN: 7.1 g/dL (ref 6.0–8.3)
Total Bilirubin: 0.7 mg/dL (ref 0.2–1.2)

## 2014-12-04 LAB — HEMOGLOBIN A1C: Hgb A1c MFr Bld: 6 % (ref 4.6–6.5)

## 2014-12-04 LAB — TSH: TSH: 0.86 u[IU]/mL (ref 0.35–4.50)

## 2014-12-04 LAB — VITAMIN D 25 HYDROXY (VIT D DEFICIENCY, FRACTURES): VITD: 28.15 ng/mL — AB (ref 30.00–100.00)

## 2014-12-04 MED ORDER — FLUTICASONE FUROATE 27.5 MCG/SPRAY NA SUSP: NASAL | Status: DC

## 2014-12-04 MED ORDER — MONTELUKAST SODIUM 10 MG PO TABS: 10.0000 mg | ORAL_TABLET | Freq: Every day | ORAL | Status: DC

## 2014-12-04 MED ORDER — CLOTRIMAZOLE 1 % EX CREA: 1.0000 "application " | TOPICAL_CREAM | Freq: Two times a day (BID) | CUTANEOUS | Status: DC

## 2014-12-04 MED ORDER — ALBUTEROL SULFATE HFA 108 (90 BASE) MCG/ACT IN AERS: 2.0000 | INHALATION_SPRAY | RESPIRATORY_TRACT | Status: DC | PRN

## 2014-12-04 MED ORDER — BUDESONIDE-FORMOTEROL FUMARATE 80-4.5 MCG/ACT IN AERO: INHALATION_SPRAY | RESPIRATORY_TRACT | Status: DC

## 2014-12-04 MED ORDER — NYSTATIN 100000 UNIT/GM EX POWD: CUTANEOUS | Status: DC

## 2014-12-04 NOTE — Patient Instructions (Signed)

## 2014-12-04 NOTE — Assessment & Plan Note (Signed)
Exam is c/w candidal skin infection beneath pannus in lower abdomen. Will start Nystatin powder. Encouraged her to keep area dry. Follow up prn.

## 2014-12-04 NOTE — Assessment & Plan Note (Signed)
General medical exam including breast exam normal today except as noted. Mammogram ordered. PAP and pelvic deferred as normal 2015, HPV neg. Labs today as ordered. Encouraged healthy diet and exercise. Tdap today.

## 2014-12-04 NOTE — Assessment & Plan Note (Signed)
Wt Readings from Last 3 Encounters:  12/04/14 248 lb 8 oz (112.719 kg)  09/02/13 245 lb 4 oz (111.245 kg)  07/26/13 248 lb (112.492 kg)   Body mass index is 40.71 kg/(m^2). Encouraged healthy diet and exercise with goal of weight loss.

## 2014-12-04 NOTE — Progress Notes (Signed)
Subjective:    Patient ID: Hannah Robinson, female    DOB: 12/24/1965, 49 y.o.   MRN: [id number]  HPI 49YO female presents for physical exam.  Feeling well. Notes some dietary indiscretion recently. Increased intake of carbs. Exercising - every day by walking. 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  Wt Readings from Last 3 Encounters:  12/04/14 248 lb 8 oz (112.719 kg)  09/02/13 245 lb 4 oz (111.245 kg)  07/26/13 248 lb (112.492 kg)   BP Readings from Last 3 Encounters:  12/04/14 132/82  09/02/13 130/90  07/26/13 110/70   Concerned about an itchy rash over her lower abdomen and a left neck. Unsure how long these have been present. Not treating them    Past medical, surgical, family and social history per today's encounter.  Review of Systems  Constitutional: Negative for fever, chills, appetite change, fatigue and unexpected weight change.  Eyes: Negative for visual disturbance.  Respiratory: Negative for shortness of breath.   Cardiovascular: Negative for chest pain and leg swelling.  Gastrointestinal: Negative for nausea, vomiting, abdominal pain, diarrhea and constipation.  Musculoskeletal: Negative for myalgias and arthralgias.  Skin: Positive for rash. Negative for color change.  Hematological: Negative for adenopathy. Does not bruise/bleed easily.  Psychiatric/Behavioral: Negative for sleep disturbance and dysphoric mood. The patient is not nervous/anxious.        Objective:    BP 132/82 mmHg  Pulse 62  Temp(Src) 97.7 F (36.5 C) (Oral)  Ht 5' 5.5" (1.664 m)  Wt 248 lb 8 oz (112.719 kg)  BMI 40.71 kg/m2  SpO2 97% Physical Exam  Constitutional: She is oriented to person, place, and time. She appears well-developed and well-nourished. No distress.  HENT:  Head: Normocephalic and atraumatic.  Right Ear: External ear normal.  Left Ear: External ear normal.  Nose: Nose normal.  Mouth/Throat: Oropharynx is clear and moist. No oropharyngeal exudate.  Eyes: Conjunctivae are  normal. Pupils are equal, round, and reactive to light. Right eye exhibits no discharge. Left eye exhibits no discharge. No scleral icterus.  Neck: Normal range of motion. Neck supple. No tracheal deviation present. No thyromegaly present.  Cardiovascular: Normal rate, regular rhythm, normal heart sounds and intact distal pulses.  Exam reveals no gallop and no friction rub.   No murmur heard. Pulmonary/Chest: Effort normal and breath sounds normal. No accessory muscle usage. No tachypnea. No respiratory distress. She has no decreased breath sounds. She has no wheezes. She has no rales. She exhibits no tenderness. Right breast exhibits no inverted nipple, no mass, no nipple discharge, no skin change and no tenderness. Left breast exhibits no inverted nipple, no mass, no nipple discharge, no skin change and no tenderness. Breasts are symmetrical.  Abdominal: Soft. Bowel sounds are normal. She exhibits no distension and no mass. There is no tenderness. There is no rebound and no guarding.  Musculoskeletal: Normal range of motion. She exhibits no edema or tenderness.  Lymphadenopathy:    She has no cervical adenopathy.  Neurological: She is alert and oriented to person, place, and time. No cranial nerve deficit. She exhibits normal muscle tone. Coordination normal.  Skin: Skin is warm and dry. Rash noted. She is not diaphoretic. No erythema. No pallor.     Psychiatric: She has a normal mood and affect. Her behavior is normal. Judgment and thought content normal.          Assessment & Plan:   Problem List Items Addressed This Visit      Unprioritized   Candidal  skin infection    Exam is c/w candidal skin infection beneath pannus in lower abdomen. Will start Nystatin powder. Encouraged her to keep area dry. Follow up prn.      Relevant Medications   clotrimazole (LOTRIMIN) 1 % cream   nystatin (MYCOSTATIN/NYSTOP) 100000 UNIT/GM POWD   Routine general medical examination at a health care  facility - Primary    General medical exam including breast exam normal today except as noted. Mammogram ordered. PAP and pelvic deferred as normal 2015, HPV neg. Labs today as ordered. Encouraged healthy diet and exercise. Tdap today.      Relevant Orders   MM Digital Screening   CBC with Differential/Platelet   Comprehensive metabolic panel   Lipid panel   Hemoglobin A1c   Microalbumin / creatinine urine ratio   Vit D  25 hydroxy (rtn osteoporosis monitoring)   TSH   GC/chlamydia probe amp, urine   HIV antibody   Severe obesity (BMI >= 40)    Wt Readings from Last 3 Encounters:  12/04/14 248 lb 8 oz (112.719 kg)  09/02/13 245 lb 4 oz (111.245 kg)  07/26/13 248 lb (112.492 kg)   Body mass index is 40.71 kg/(m^2). Encouraged healthy diet and exercise with goal of weight loss.          Return in about 1 year (around 12/04/2015) for Physical.

## 2014-12-04 NOTE — Progress Notes (Signed)
Pre visit review using our clinic review tool, if applicable. No additional management support is needed unless otherwise documented below in the visit note. 

## 2014-12-04 NOTE — Addendum Note (Signed)
Addended by: Marchia MeiersEASTWOOD, Oluwatoyin Banales M on: 12/04/2014 04:27 PM   Modules accepted: Orders

## 2014-12-05 ENCOUNTER — Encounter: Payer: Self-pay | Admitting: *Deleted

## 2014-12-05 ENCOUNTER — Other Ambulatory Visit: Payer: Self-pay | Admitting: Internal Medicine

## 2014-12-05 LAB — MICROALBUMIN / CREATININE URINE RATIO
Creatinine,U: 111 mg/dL
MICROALB UR: 1.1 mg/dL (ref 0.0–1.9)
MICROALB/CREAT RATIO: 1 mg/g (ref 0.0–30.0)

## 2014-12-06 LAB — GC/CHLAMYDIA PROBE AMP, URINE
Chlamydia, Swab/Urine, PCR: NEGATIVE
GC Probe Amp, Urine: NEGATIVE

## 2014-12-09 LAB — HIV ANTIBODY (ROUTINE TESTING W REFLEX): HIV 1&2 Ab, 4th Generation: NONREACTIVE

## 2014-12-20 LAB — HM MAMMOGRAPHY: HM MAMMO: NEGATIVE

## 2014-12-21 ENCOUNTER — Encounter: Payer: Self-pay | Admitting: *Deleted

## 2015-04-10 ENCOUNTER — Telehealth: Payer: Self-pay | Admitting: Pulmonary Disease

## 2015-04-10 NOTE — Telephone Encounter (Signed)
Called and spoke to pt. Pt requesting a symbicort 80 sample, informed pt we do not have any Symbicort 80 samples at this time. Pt hasnt been seen since 2014, advised pt she needs OV. Pt stated she will call back for appt. Nothing further needed at this time.

## 2015-08-08 ENCOUNTER — Ambulatory Visit
Admission: EM | Admit: 2015-08-08 | Discharge: 2015-08-08 | Disposition: A | Discharge status: Home / Self Care | Payer: Federal, State, Local not specified - PPO | Attending: Family Medicine | Admitting: Family Medicine

## 2015-08-08 ENCOUNTER — Encounter: Payer: Self-pay | Admitting: Emergency Medicine

## 2015-08-08 ENCOUNTER — Ambulatory Visit (INDEPENDENT_AMBULATORY_CARE_PROVIDER_SITE_OTHER): Payer: Federal, State, Local not specified - PPO

## 2015-08-08 DIAGNOSIS — S86811A Strain of other muscle(s) and tendon(s) at lower leg level, right leg, initial encounter: Secondary | ICD-10-CM

## 2015-08-08 DIAGNOSIS — S86911A Strain of unspecified muscle(s) and tendon(s) at lower leg level, right leg, initial encounter: Secondary | ICD-10-CM

## 2015-08-08 DIAGNOSIS — M25561 Pain in right knee: Secondary | ICD-10-CM

## 2015-08-08 DIAGNOSIS — R350 Frequency of micturition: Secondary | ICD-10-CM | POA: Diagnosis not present

## 2015-08-08 DIAGNOSIS — R300 Dysuria: Secondary | ICD-10-CM

## 2015-08-08 LAB — URINALYSIS COMPLETE WITH MICROSCOPIC (ARMC ONLY)
Bacteria, UA: NONE SEEN
Bilirubin Urine: NEGATIVE
GLUCOSE, UA: NEGATIVE mg/dL
Hgb urine dipstick: NEGATIVE
KETONES UR: NEGATIVE mg/dL
LEUKOCYTES UA: NEGATIVE
NITRITE: NEGATIVE
PROTEIN: NEGATIVE mg/dL
SPECIFIC GRAVITY, URINE: 1.025 (ref 1.005–1.030)
pH: 7 (ref 5.0–8.0)

## 2015-08-08 MED ORDER — MELOXICAM 15 MG PO TABS: 15.0000 mg | ORAL_TABLET | Freq: Every day | ORAL | Status: DC

## 2015-08-08 NOTE — Discharge Instructions (Signed)
Cryotherapy Cryotherapy is when you put ice on your injury. Ice helps lessen pain and puffiness (swelling) after an injury. Ice works the best when you start using it in the first 24 to 48 hours after an injury. HOME CARE  Put a dry or damp towel between the ice pack and your skin.  You may press gently on the ice pack.  Leave the ice on for no more than 10 to 20 minutes at a time.  Check your skin after 5 minutes to make sure your skin is okay.  Rest at least 20 minutes between ice pack uses.  Stop using ice when your skin loses feeling (numbness).  Do not use ice on someone who cannot tell you when it hurts. This includes small children and people with memory problems (dementia). GET HELP RIGHT AWAY IF:  You have white spots on your skin.  Your skin turns blue or pale.  Your skin feels waxy or hard.  Your puffiness gets worse. MAKE SURE YOU:   Understand these instructions.  Will watch your condition.  Will get help right away if you are not doing well or get worse.   This information is not intended to replace advice given to you by your health care provider. Make sure you discuss any questions you have with your health care provider.   Document Released: 12/24/2007 Document Revised: 09/29/2011 Document Reviewed: 02/27/2011 Elsevier Interactive Patient Education 2016 Else[employer] Inc[employer]  Knee Pain Knee pain is a common problem. It can have many causes. The pain often goes away by following your doctor's home care instructions. Treatment for ongoing pain will depend on the cause of your pain. If your knee pain continues, more tests may be needed to diagnose your condition. Tests may include X-rays or other imaging studies of your knee. HOME CARE  Take medicines only as told by your doctor.  Rest your knee and keep it raised (elevated) while you are resting.  Do not do things that cause pain or make your pain worse.  Avoid activities where both feet leave the ground at  the same time, such as running, jumping rope, or doing jumping jacks.  Apply ice to the knee area:  Put ice in a plastic bag.  Place a towel between your skin and the bag.  Leave the ice on for 20 minutes, 2-3 times a day.  Ask your doctor if you should wear an elastic knee support.  Sleep with a pillow under your knee.  Lose weight if you are overweight. Being overweight can make your knee hurt more.  Do not use any tobacco products, including cigarettes, chewing tobacco, or electronic cigarettes. If you need help quitting, ask your doctor. Smoking may slow the healing of any bone and joint problems that you may have. GET HELP IF:  Your knee pain does not stop, it changes, or it gets worse.  You have a fever along with knee pain.  Your knee gives out or locks up.  Your knee becomes more swollen. GET HELP RIGHT AWAY IF:   Your knee feels hot to the touch.  You have chest pain or trouble breathing.   This information is not intended to replace advice given to you by your health care provider. Make sure you discuss any questions you have with your health care provider.   Document Released: 10/03/2008 Document Revised: 07/28/2014 Document Reviewed: 09/07/2013 Elsevier Interactive Patient Education 2016 Else[employer]  Muscle Strain A muscle strain is an injury that occurs when  a muscle is stretched beyond its normal length. Usually a small number of muscle fibers are torn when this happens. Muscle strain is rated in degrees. First-degree strains have the least amount of muscle fiber tearing and pain. Second-degree and third-degree strains have increasingly more tearing and pain.  Usually, recovery from muscle strain takes 1-2 weeks. Complete healing takes 5-6 weeks.  CAUSES  Muscle strain happens when a sudden, violent force placed on a muscle stretches it too far. This may occur with lifting, sports, or a fall.  RISK FACTORS Muscle strain is especially common in athletes.    SIGNS AND SYMPTOMS At the site of the muscle strain, there may be:  Pain.  Bruising.  Swelling.  Difficulty using the muscle due to pain or lack of normal function. DIAGNOSIS  Your health care provider will perform a physical exam and ask about your medical history. TREATMENT  Often, the best treatment for a muscle strain is resting, icing, and applying cold compresses to the injured area.  HOME CARE INSTRUCTIONS   Use the PRICE method of treatment to promote muscle healing during the first 2-3 days after your injury. The PRICE method involves:  Protecting the muscle from being injured again.  Restricting your activity and resting the injured body part.  Icing your injury. To do this, put ice in a plastic bag. Place a towel between your skin and the bag. Then, apply the ice and leave it on from 15-20 minutes each hour. After the third day, switch to moist heat packs.  Apply compression to the injured area with a splint or elastic bandage. Be careful not to wrap it too tightly. This may interfere with blood circulation or increase swelling.  Elevate the injured body part above the level of your heart as often as you can.  Only take over-the-counter or prescription medicines for pain, discomfort, or fever as directed by your health care provider.  Warming up prior to exercise helps to prevent future muscle strains. SEEK MEDICAL CARE IF:   You have increasing pain or swelling in the injured area.  You have numbness, tingling, or a significant loss of strength in the injured area. MAKE SURE YOU:   Understand these instructions.  Will watch your condition.  Will get help right away if you are not doing well or get worse.   This information is not intended to replace advice given to you by your health care provider. Make sure you discuss any questions you have with your health care provider.   Document Released: 07/07/2005 Document Revised: 04/27/2013 Document Reviewed:  02/03/2013 Elsevier Interactive Patient Education 2016 Else[employer] Inc.  Dysuria Dysuria is pain or discomfort while urinating. The pain or discomfort may be felt in the tube that carries urine out of the bladder (urethra) or in the surrounding tissue of the genitals. The pain may also be felt in the groin area, lower abdomen, and lower back. You may have to urinate frequently or have the sudden feeling that you have to urinate (urgency). Dysuria can affect both men and women, but is more common in women. Dysuria can be caused by many different things, including:  Urinary tract infection in women.  Infection of the kidney or bladder.  Kidney stones or bladder stones.  Certain sexually transmitted infections (STIs), such as chlamydia.  Dehydration.  Inflammation of the vagina.  Use of certain medicines.  Use of certain soaps or scented products that cause irritation. HOME CARE INSTRUCTIONS Watch your dysuria for any changes. The  following actions may help to reduce any discomfort you are feeling:  Drink enough fluid to keep your urine clear or pale yellow.  Empty your bladder often. Avoid holding urine for long periods of time.  After a bowel movement or urination, women should cleanse from front to back, using each tissue only once.  Empty your bladder after sexual intercourse.  Take medicines only as directed by your health care provider.  If you were prescribed an antibiotic medicine, finish it all even if you start to feel better.  Avoid caffeine, tea, and alcohol. They can irritate the bladder and make dysuria worse. In men, alcohol may irritate the prostate.  Keep all follow-up visits as directed by your health care provider. This is important.  If you had any tests done to find the cause of dysuria, it is your responsibility to obtain your test results. Ask the lab or department performing the test when and how you will get your results. Talk with your health care  provider if you have any questions about your results. SEEK MEDICAL CARE IF:  You develop pain in your back or sides.  You have a fever.  You have nausea or vomiting.  You have blood in your urine.  You are not urinating as often as you usually do. SEEK IMMEDIATE MEDICAL CARE IF:  You pain is severe and not relieved with medicines.  You are unable to hold down any fluids.  You or someone else notices a change in your mental function.  You have a rapid heartbeat at rest.  You have shaking or chills.  You feel extremely weak.   This information is not intended to replace advice given to you by your health care provider. Make sure you discuss any questions you have with your health care provider.   Document Released: 04/04/2004 Document Revised: 07/28/2014 Document Reviewed: 03/02/2014 Elsevier Interactive Patient Education [employer]  Urinary Frequency The number of times a normal person urinates depends upon how much liquid they take in and how much liquid they are losing. If the temperature is hot and there is high humidity, then the person will sweat more and usually breathe a little more frequently. These factors decrease the amount of frequency of urination that would be considered normal. The amount you drink is easily determined, but the amount of fluid lost is sometimes more difficult to calculate.  Fluid is lost in two ways:  Sensible fluid loss is usually measured by the amount of urine that you get rid of. Losses of fluid can also occur with diarrhea.  Insensible fluid loss is more difficult to measure. It is caused by evaporation. Insensible loss of fluid occurs through breathing and sweating. It usually ranges from a little less than a quart to a little more than a quart of fluid a day. In normal temperatures and activity levels, the average person may urinate 4 to 7 times in a 24-hour period. Needing to urinate more often than that could indicate a problem.  If one urinates 4 to 7 times in 24 hours and has large volumes each time, that could indicate a different problem from one who urinates 4 to 7 times a day and has small volumes. The time of urinating is also important. Most urinating should be done during the waking hours. Getting up at night to urinate frequently can indicate some problems. CAUSES  The bladder is the organ in your lower abdomen that holds urine. Like a balloon, it swells some as  it fills up. Your nerves sense this and tell you it is time to head for the bathroom. There are a number of reasons that you might feel the need to urinate more often than usual. They include:  Urinary tract infection. This is usually associated with other signs such as burning when you urinate.  In men, problems with the prostate (a walnut-size gland that is located near the tube that carries urine out of your body). There are two reasons why the prostate can cause an increased frequency of urination:  An enlarged prostate that does not let the bladder empty well. If the bladder only half empties when you urinate, then it only has half the capacity to fill before you have to urinate again.  The nerves in the bladder become more hypersensitive with an increased size of the prostate even if the bladder empties completely.  Pregnancy.  Obesity. Excess weight is more likely to cause a problem for women than for men.  Bladder stones or other bladder problems.  Caffeine.  Alcohol.  Medications. For example, drugs that help the body get rid of extra fluid (diuretics) increase urine production. Some other medicines must be taken with lots of fluids.  Muscle or nerve weakness. This might be the result of a spinal cord injury, a stroke, multiple sclerosis, or Parkinson disease.  Long-standing diabetes can decrease the sensation of the bladder. This loss of sensation makes it harder to sense the bladder needs to be emptied. Over a period of years, the  bladder is stretched out by constant overfilling. This weakens the bladder muscles so that the bladder does not empty well and has less capacity to fill with new urine.  Interstitial cystitis (also called painful bladder syndrome). This condition develops because the tissues that line the inside of the bladder are inflamed (inflammation is the body's way of reacting to injury or infection). It causes pain and frequent urination. It occurs in women more often than in men. DIAGNOSIS   To decide what might be causing your urinary frequency, your health care provider will probably:  Ask about symptoms you have noticed.  Ask about your overall health. This will include questions about any medications you are taking.  Do a physical examination.  Order some tests. These might include:  A blood test to check for diabetes or other health issues that could be contributing to the problem.  Urine testing. This could measure the flow of urine and the pressure on the bladder.  A test of your neurological system (the brain, spinal cord, and nerves). This is the system that senses the need to urinate.  A bladder test to check whether it is emptying completely when you urinate.  Cystoscopy. This test uses a thin tube with a tiny camera on it. It offers a look inside your urethra and bladder to see if there are problems.  Imaging tests. You might be given a contrast dye and then asked to urinate. X-rays are taken to see how your bladder is working. TREATMENT  It is important for you to be evaluated to determine if the amount or frequency that you have is unusual or abnormal. If it is found to be abnormal, the cause should be determined and this can usually be found out easily. Depending upon the cause, treatment could include medication, stimulation of the nerves, or surgery. There are not too many things that you can do as an individual to change your urinary frequency. It is important that you balance  the amount of fluid intake needed to compensate for your activity and the temperature. Medical problems will be diagnosed and taken care of by your physician. There is no particular bladder training such as Kegel exercises that you can do to help urinary frequency. This is an exercise that is usually recommended for people who have leaking of urine when they laugh, cough, or sneeze. HOME CARE INSTRUCTIONS   Take any medications your health care provider prescribed or suggested. Follow the directions carefully.  Practice any lifestyle changes that are recommended. These might include:  Drinking less fluid or drinking at different times of the day. If you need to urinate often during the night, for example, you may need to stop drinking fluids early in the evening.  Cutting down on caffeine or alcohol. They both can make you need to urinate more often than normal. Caffeine is found in coffee, tea, and sodas.  Losing weight, if that is recommended.  Keep a journal or a log. You might be asked to record how much you drink and when and where you feel the need to urinate. This will also help evaluate how well the treatment provided by your physician is working. SEEK MEDICAL CARE IF:   Your need to urinate often gets worse.  You feel increased pain or irritation when you urinate.  You notice blood in your urine.  You have questions about any medications that your health care provider recommended.  You notice blood, pus, or swelling at the site of any test or treatment procedure.  You develop a fever of more than 100.96F (38.1C). SEEK IMMEDIATE MEDICAL CARE IF:  You develop a fever of more than 102.38F (38.9C).   This information is not intended to replace advice given to you by your health care provider. Make sure you discuss any questions you have with your health care provider.   Document Released: 05/03/2009 Document Revised: 07/28/2014 Document Reviewed: 05/03/2009 Elsevier  Interactive Patient Education [employer]

## 2015-08-08 NOTE — ED Notes (Signed)
Pt with possible UTI, frequent urination and also wants to be seen for some right knee pain that states is a "nagging". No known injury. Pt ambulated to tx room with no difficulty noted.

## 2015-08-08 NOTE — ED Provider Notes (Signed)
CSN: [id number]     Arrival date & time 08/08/15  1241 History   First MD Initiated Contact with Patient 08/08/15 1303    Nurses notes were reviewed. Chief Complaint  Patient presents with  . Urinary Tract Infection  . Knee Pain     #1 right knee pain patient states Thursday sustaining bed sitting up she felt a sharp pain right knee. Since then she's had pain in the right knee over the medial aspect. She uses Motrin before but wants to get Motrin 800 mg 2 times a day for the right knee pain. 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 She denies any direct trauma or injury other significant problems with her knee.  #2 she puts while she is here she is worried about UTI. She reports some dysuria and some frequency she's not had approval bad symptoms but states that she's had symptoms like this before.  She does not smoke and doesn't dip snuff. She has a family history of lung cancer in maternal aunt and clotting disorder nor file this in her mother and heart disease some of his well her son has asthma.   (Consider location/radiation/quality/duration/timing/severity/associated sxs/prior Treatment) Patient is a 50 y.o. female presenting with urinary tract infection and knee pain. The history is provided by the patient. No language interpreter was used.  Urinary Tract Infection Pain quality:  Aching Pain severity:  Mild Onset quality:  Sudden Timing:  Sporadic Progression:  Waxing and waning Chronicity:  New Recent urinary tract infections: yes   Relieved by:  Nothing Ineffective treatments:  None tried Urinary symptoms: frequent urination   Associated symptoms: flank pain   Risk factors: recurrent urinary tract infections and sexually active   Risk factors: no hx of pyelonephritis, no hx of urolithiasis and no kidney transplant   Knee Pain Time since incident:  7 days Pain details:    Quality:  Aching,  pressure, shooting, throbbing and sharp   Radiates to:  Does not radiate   Severity:  Moderate   Onset quality:  Sudden   Duration:  7 days   Progression:  Worsening Chronicity:  New Foreign body present:  No foreign bodies Prior injury to area:  Yes Relieved by:  Nothing Worsened by:  Bearing weight and activity Ineffective treatments:  None tried Associated symptoms: back pain   Risk factors: obesity   Risk factors: no concern for non-accidental trauma, no frequent fractures, no known bone disorder and no recent illness     Past Medical History  Diagnosis Date  . Childhood asthma   . Urinary tract bacterial infections   . Diabetes mellitus     borderline  . Anemia     iron infusions in past  . Iritis     Followed by Dr. Marti Sleigh   Past Surgical History  Procedure Laterality Date  . Breast surgery  1988    benign  . Vaginal delivery      2   Family History  Problem Relation Age of Onset  . Lung cancer Maternal Aunt   . Alcohol abuse Maternal Aunt   . Asthma Son   . Clotting disorder Mother   . Arthritis Mother   . Heart disease Mother   . Bipolar disorder Brother   . Stroke Maternal Grandmother   . Cancer Maternal Aunt     lung   Social History  Substance Use Topics  . Smoking status:  Never Smoker   . Smokeless tobacco: Never Used  . Alcohol Use: 2.0 oz/week    4 drink(s) per week     Comment: Wine   OB History    No data available     Review of Systems  Genitourinary: Positive for flank pain.  Musculoskeletal: Positive for back pain.    Allergies  Amoxicillin  Home Medications   Prior to Admission medications   Medication Sig Start Date End Date Taking? Authorizing Provider  albuterol (PROAIR HFA) 108 (90 BASE) MCG/ACT inhaler Inhale 2 puffs into the lungs every 4 (four) hours as needed. 12/04/14   Shelia Media, MD  budesonide-formoterol (SYMBICORT) 80-4.5 MCG/ACT inhaler INHALE 2 PUFFS BY MOUTH 2 (TWO) TIMES DAILY. 12/04/14   Shelia Media, MD  cetirizine (ZYRTEC) 10 MG tablet Take 10 mg by mouth daily as needed.     Historical Provider, MD  clotrimazole (LOTRIMIN) 1 % cream Apply 1 application topically 2 (two) times daily. 12/04/14   Shelia Media, MD  DUREZOL 0.05 % EMUL  09/25/14   Historical Provider, MD  famotidine (PEPCID) 20 MG tablet Take 20 mg by mouth daily.    Historical Provider, MD  fluticasone (VERAMYST) 27.5 MCG/SPRAY nasal spray PLACE 2 SPRAYS INTO THE NOSE DAILY. 12/04/14   Shelia Media, MD  meloxicam (MOBIC) 15 MG tablet Take 1 tablet (15 mg total) by mouth daily. 08/08/15   Hassan Rowan, MD  montelukast (SINGULAIR) 10 MG tablet Take 1 tablet (10 mg total) by mouth at bedtime. 12/04/14   Shelia Media, MD  Multiple Vitamins-Minerals (WOMENS MULTIVITAMIN PLUS PO) Take by mouth.    Historical Provider, MD  nystatin (MYCOSTATIN/NYSTOP) 100000 UNIT/GM POWD Apply to affected area twice daily as directed. 12/04/14   Shelia Media, MD  VERAMYST 27.5 MCG/SPRAY nasal spray PLACE 2 SPRAYS INTO THE NOSE DAILY. 12/05/14   Shelia Media, MD  Vitamin D, Ergocalciferol, (DRISDOL) 50000 UNITS CAPS capsule 50,000 Units. 12/21/13   Historical Provider, MD   Meds Ordered and Administered this Visit  Medications - No data to display  BP 143/84 mmHg  Pulse 72  Temp(Src) 97.9 F (36.6 C) (Oral)  Resp 100  Ht  (1.676 m)  Wt 238 lb (107.956 kg)  BMI 38.43 kg/m2  SpO2 100% No data found.   Physical Exam  Constitutional: She appears well-developed and well-nourished.  HENT:  Head: Normocephalic and atraumatic.  Eyes: Conjunctivae are normal. Pupils are equal, round, and reactive to light.  Neck: Normal range of motion. Neck supple.  Genitourinary: Vagina normal.  Musculoskeletal: Normal range of motion. She exhibits tenderness. She exhibits no edema.       Right knee: She exhibits swelling. Tenderness found.       Legs: Tenderness over the mid lower medial aspect of the right knee.  Neurological:  She is alert.  Skin: Skin is warm. No rash noted. No erythema.  Vitals reviewed.   ED Course  Procedures (including critical care time)  Labs Review Labs Reviewed  URINALYSIS COMPLETEWITH MICROSCOPIC [facility] ONLY) - Abnormal; Notable for the following:    Squamous Epithelial / LPF 6-30 (*)    All other components within normal limits  URINE CULTURE    Imaging Review Dg Knee Ap/lat W/sunrise Right  08/08/2015  CLINICAL DATA:  Right knee pain for 5 days.  No known injury. EXAM: RIGHT KNEE 3 VIEWS COMPARISON:  None. FINDINGS: The mineralization and alignment are normal. There is no evidence of acute  fracture or dislocation. The joint spaces are maintained. No significant joint effusion. There is some nodularity within the soft tissues of the lower leg medially, suggesting possible varicosities. IMPRESSION: No acute osseous findings or significant arthropathic changes. Question lower leg varicosities. Electronically Signed   By: Carey Bullocks M.D.   On: 08/08/2015 14:04     Visual Acuity Review  Right Eye Distance:   Left Eye Distance:   Bilateral Distance:    Right Eye Near:   Left Eye Near:    Bilateral Near:        MDM   1. Knee pain, acute, right   2. Knee strain, right, initial encounter   3. Frequency of urination   4. Dysuria      #1 right knee pain will give a prescription for Mobic 15 mg 1 tablet day to take with food right knee pain providing x-ray is negative. #2 will treat for UTI if either UA or urine culture sensitivity is significant but with her history and they does have symptoms this time we'll hold off on treating unless we have some evidence of a UTI. Follow-up PCP in a week if needed.        Hassan Rowan, MD 08/08/15 1426

## 2015-08-10 LAB — URINE CULTURE: Culture: NO GROWTH

## 2015-08-17 ENCOUNTER — Telehealth: Payer: Self-pay

## 2015-08-17 NOTE — ED Notes (Signed)
Patient called [facility] to request Motrin 800 mg. Dr. Thurmond Butts authorized Motrin 800 mg. Take 1 po tid prn. #90. No refills. [facility] (formerly [facility]), number given for patient to call and set up an appointment for evaluation of knee pain. Rx called to [facility] in [place] [phone]

## 2015-09-17 ENCOUNTER — Ambulatory Visit
Admission: EM | Admit: 2015-09-17 | Discharge: 2015-09-17 | Disposition: A | Discharge status: Home / Self Care | Payer: Federal, State, Local not specified - PPO | Attending: Family Medicine | Admitting: Family Medicine

## 2015-09-17 ENCOUNTER — Other Ambulatory Visit: Payer: Self-pay | Admitting: Internal Medicine

## 2015-09-17 ENCOUNTER — Encounter: Payer: Self-pay | Admitting: *Deleted

## 2015-09-17 DIAGNOSIS — R6889 Other general symptoms and signs: Secondary | ICD-10-CM

## 2015-09-17 LAB — RAPID INFLUENZA A&B ANTIGENS: Influenza B (ARMC): NOT DETECTED — AB

## 2015-09-17 LAB — RAPID INFLUENZA A&B ANTIGENS (ARMC ONLY): INFLUENZA A (ARMC): NOT DETECTED — AB

## 2015-09-17 MED ORDER — OSELTAMIVIR PHOSPHATE 75 MG PO CAPS: 75.0000 mg | ORAL_CAPSULE | Freq: Two times a day (BID) | ORAL | Status: DC

## 2015-09-17 MED ORDER — HYDROCOD POLST-CPM POLST ER 10-8 MG/5ML PO SUER: 5.0000 mL | Freq: Two times a day (BID) | ORAL | Status: DC | PRN

## 2015-09-17 MED ORDER — FEXOFENADINE-PSEUDOEPHED ER 180-240 MG PO TB24: 1.0000 | ORAL_TABLET | Freq: Every day | ORAL | Status: DC

## 2015-09-17 MED ORDER — IBUPROFEN 800 MG PO TABS: 800.0000 mg | ORAL_TABLET | Freq: Three times a day (TID) | ORAL | Status: DC

## 2015-09-17 NOTE — Discharge Instructions (Signed)
Influenza, Adult °Influenza (flu) is an infection in the mouth, nose, and throat (respiratory tract) caused by a virus. The flu can make you feel very ill. Influenza spreads easily from person to person (contagious).  °HOME CARE  °· Only take medicines as told by your doctor. °· Use a cool mist humidifier to make breathing easier. °· Get plenty of rest until your fever goes away. This usually takes 3 to 4 days. °· Drink enough fluids to keep your pee (urine) clear or pale yellow. °· Cover your mouth and nose when you cough or sneeze. °· Wash your hands well to avoid spreading the flu. °· Stay home from work or school until your fever has been gone for at least 1 full day. °· Get a flu shot every year. °GET HELP RIGHT AWAY IF:  °· You have trouble breathing or feel short of breath. °· Your skin or nails turn blue. °· You have severe neck pain or stiffness. °· You have a severe headache, facial pain, or earache. °· Your fever gets worse or keeps coming back. °· You feel sick to your stomach (nauseous), throw up (vomit), or have watery poop (diarrhea). °· You have chest pain. °· You have a deep cough that gets worse, or you cough up more thick spit (mucus). °MAKE SURE YOU:  °· Understand these instructions. °· Will watch your condition. °· Will get help right away if you are not doing well or get worse. °  °This information is not intended to replace advice given to you by your health care provider. Make sure you discuss any questions you have with your health care provider. °  °Document Released: 04/15/2008 Document Revised: 07/28/2014 Document Reviewed: 10/06/2011 °Elsevier Interactive Patient Education ©2016 [employer] °Influenza Tests °WHY AM I HAVING THIS TEST? °You may have an influenza test to help your health care provider determine what type of respiratory infection you have. The test may also be used to help determine a treatment plan and to monitor influenza activity within a community. °There are two  types of influenza virus: types A and B. Often, one strain of type A influenza will be the most common type of influenza in a community during flu season. This is typically between the months of October and May. Influenza tests can help determine which strain of influenza type A is occurring most often in the community. °WHAT KIND OF SAMPLE IS TAKEN? °Influenza tests are performed by collecting a small sample of fluids (secretions) from your nose or throat using a cotton swab. Tests performed on nasal secretions are more accurate than tests performed on a sample taken from your throat. °· Rapid influenza tests are available and have become the most frequently used tests for influenza. They are most accurate when completed within the first 48 hours after your symptoms begin. °· Depending on the method, a rapid influenza test may be completed in your health care provider's office in less than 30 minutes. It can also be sent to a lab with the results available the same day. °· Depending on the particular type of test used, it can identify influenza type A, a mixture of types A and B, or differentiate between type A and B. °· Another test that your health care provider may order is a viral culture. This also requires the collection of secretions from your nose or throat. The sample is then sent to a lab for processing. This may take several days to complete. °HOW ARE YOUR TEST RESULTS   REPORTED? °Your test results will be reported as either positive or negative. A false-negative result can occur. A false-negative result is incorrect because it indicates a condition or finding is not present when it is. °It is your responsibility to obtain your test results. Ask the lab or department performing the test when and how you will get your results. °WHAT DO THE RESULTS MEAN? °· A positive test means you have influenza. Tests may further determine the type of influenza you have. °· A negative influenza test result means it is  not likely that you have influenza. °· A false-negative result can occur. False-negative results are more likely to happen at the height of the influenza season. °Talk with your health care provider to discuss your results, treatment options, and if necessary, the need for more tests. Talk with your health care provider if you have any questions about your results. °  °This information is not intended to replace advice given to you by your health care provider. Make sure you discuss any questions you have with your health care provider. °  °Document Released: 04/16/2005 Document Revised: 07/28/2014 Document Reviewed: 11/23/2013 °Elsevier Interactive Patient Education ©2016 [employer] Inc. ° °

## 2015-09-17 NOTE — ED Notes (Signed)
Patient started with a sore throat 2 days ago and has progressed into weakness, fever, and body aches. Additional symptom of nasal congestion present.

## 2015-09-17 NOTE — ED Provider Notes (Signed)
CSN: [id number]     Arrival date & time 09/17/15  1208 History   First MD Initiated Contact with Patient 09/17/15 1512    Nurses notes were reviewed. Chief Complaint  Patient presents with  . Generalized Body Aches  . Fever    Patient reports becoming sick Saturday with nausea vomiting and aching all over. She states that she had a sore throat for a day running nose she still has a headache and achiness hasn't gotten better and she is coughing up some thick sputum as well. Unfortunately she never did get a flu shot this year because of what was available at work place she was sick. She states several coworkers have been out because the flu and his supervisor when she called today also told her that they had the flu also.  Patient does not smoke and but had maternal lung cancer and alcohol abuse and mother had clotting disorder arthritis heart disease as is a brother with bipolar disease.   (Consider location/radiation/quality/duration/timing/severity/associated sxs/prior Treatment) Patient is a 50 y.o. female presenting with fever. The history is provided by the patient and a relative. No language interpreter was used.  Fever Temp source:  Oral Severity:  Moderate Onset quality:  Sudden Progression:  Worsening Chronicity:  New Relieved by:  None tried Associated symptoms: congestion, cough, headaches, myalgias and sore throat   Associated symptoms: no rash     Past Medical History  Diagnosis Date  . Childhood asthma   . Urinary tract bacterial infections   . Diabetes mellitus     borderline  . Anemia     iron infusions in past  . Iritis     Followed by Dr. Marti Sleigh   Past Surgical History  Procedure Laterality Date  . Breast surgery  1988    benign  . Vaginal delivery      2   Family History  Problem Relation Age of Onset  . Lung cancer Maternal Aunt   . Alcohol abuse Maternal Aunt   . Asthma Son   . Clotting disorder Mother   . Arthritis Mother   . Heart disease  Mother   . Bipolar disorder Brother   . Stroke Maternal Grandmother   . Cancer Maternal Aunt     lung   Social History  Substance Use Topics  . Smoking status: Never Smoker   . Smokeless tobacco: Never Used  . Alcohol Use: 2.0 oz/week    4 drink(s) per week     Comment: Wine   OB History    No data available     Review of Systems  Constitutional: Positive for fever.  HENT: Positive for congestion and sore throat.   Respiratory: Positive for cough and shortness of breath.   Musculoskeletal: Positive for myalgias.  Skin: Negative for rash.  Neurological: Positive for headaches.  All other systems reviewed and are negative.   Allergies  Other and Amoxicillin  Home Medications   Prior to Admission medications   Medication Sig Start Date End Date Taking? Authorizing Provider  albuterol (PROAIR HFA) 108 (90 BASE) MCG/ACT inhaler Inhale 2 puffs into the lungs every 4 (four) hours as needed. 12/04/14  Yes Shelia Media, MD  budesonide-formoterol (SYMBICORT) 80-4.5 MCG/ACT inhaler INHALE 2 PUFFS BY MOUTH 2 (TWO) TIMES DAILY. 12/04/14  Yes Shelia Media, MD  cetirizine (ZYRTEC) 10 MG tablet Take 10 mg by mouth daily as needed.    Yes Historical Provider, MD  DUREZOL 0.05 % EMUL  09/25/14  Yes  Historical Provider, MD  fluticasone (VERAMYST) 27.5 MCG/SPRAY nasal spray PLACE 2 SPRAYS INTO THE NOSE DAILY. 12/04/14  Yes Shelia Media, MD  meloxicam (MOBIC) 15 MG tablet Take 1 tablet (15 mg total) by mouth daily. 08/08/15  Yes Hassan Rowan, MD  Multiple Vitamins-Minerals (WOMENS MULTIVITAMIN PLUS PO) Take by mouth.   Yes Historical Provider, MD  chlorpheniramine-HYDROcodone (TUSSIONEX PENNKINETIC ER) 10-8 MG/5ML SUER Take 5 mLs by mouth every 12 (twelve) hours as needed for cough. 09/17/15   Hassan Rowan, MD  clotrimazole (LOTRIMIN) 1 % cream Apply 1 application topically 2 (two) times daily. 12/04/14   Shelia Media, MD  famotidine (PEPCID) 20 MG tablet Take 20 mg by mouth daily.     Historical Provider, MD  fexofenadine-pseudoephedrine (ALLEGRA-D ALLERGY & CONGESTION) 180-240 MG 24 hr tablet Take 1 tablet by mouth daily. 09/17/15   Hassan Rowan, MD  ibuprofen (ADVIL,MOTRIN) 800 MG tablet Take 1 tablet (800 mg total) by mouth 3 (three) times daily. 09/17/15   Hassan Rowan, MD  montelukast (SINGULAIR) 10 MG tablet Take 1 tablet (10 mg total) by mouth at bedtime. 12/04/14   Shelia Media, MD  nystatin (MYCOSTATIN/NYSTOP) 100000 UNIT/GM POWD Apply to affected area twice daily as directed. 12/04/14   Shelia Media, MD  oseltamivir (TAMIFLU) 75 MG capsule Take 1 capsule (75 mg total) by mouth 2 (two) times daily. 09/17/15   Hassan Rowan, MD  VERAMYST 27.5 MCG/SPRAY nasal spray PLACE 2 SPRAYS INTO THE NOSE DAILY. 12/05/14   Shelia Media, MD  Vitamin D, Ergocalciferol, (DRISDOL) 50000 UNITS CAPS capsule 50,000 Units. 12/21/13   Historical Provider, MD   Meds Ordered and Administered this Visit  Medications - No data to display  BP 135/82 mmHg  Pulse 128  Temp(Src) 100.3 F (37.9 C) (Oral)  Resp 20  Ht 5\' 6"  (1.676 m)  Wt 238 lb (107.956 kg)  BMI 38.43 kg/m2  SpO2 100% No data found.   Physical Exam  Constitutional: She is oriented to person, place, and time. She appears well-developed and well-nourished.  HENT:  Head: Normocephalic and atraumatic.  Right Ear: Hearing, tympanic membrane, external ear and ear canal normal.  Left Ear: Hearing, tympanic membrane, external ear and ear canal normal. No decreased hearing is noted.  Nose: Mucosal edema present.  Mouth/Throat: Oropharynx is clear and moist. Normal dentition. No dental caries.  Eyes: Conjunctivae are normal. Pupils are equal, round, and reactive to light.  Neck: Normal range of motion. Neck supple. No tracheal deviation present.  Cardiovascular: Normal rate, regular rhythm and normal heart sounds.   Pulmonary/Chest: Effort normal and breath sounds normal. No respiratory distress.  Abdominal: Soft.   Musculoskeletal: Normal range of motion. She exhibits no tenderness.  Neurological: She is alert and oriented to person, place, and time.  Skin: Skin is warm and dry.  Psychiatric: She has a normal mood and affect.  Vitals reviewed.   ED Course  Procedures (including critical care time)  Labs Review Labs Reviewed  RAPID INFLUENZA A&B ANTIGENS ([facility] ONLY) - Abnormal; Notable for the following:    Influenza A [facility]) NOT DETECTED (*)    Influenza B ([facility]) NOT DETECTED (*)    All other components within normal limits    Imaging Review No results found.   Visual Acuity Review  Right Eye Distance:   Left Eye Distance:   Bilateral Distance:    Right Eye Near:   Left Eye Near:    Bilateral Near:     Results  for orders placed or performed during the hospital encounter of 09/17/15  Rapid Influenza A&B Antigens ([facility] only)  Result Value Ref Range   Influenza A [facility]) NOT DETECTED (A) NEGATIVE   Influenza B ([facility]) NOT DETECTED (A) NEGATIVE     MDM   1. Flu-like symptoms     Flu test was negative but patient does meet the picture of a flu illness some will treat with Tamiflu since it's only been about 48 hours since symptoms started. We'll place on Allegra-D Motrin for the aches and pain to patient request and Tussionex 1 teaspoon twice a day when necessary for cough.    Work note given for Monday Tuesday and Wednesday with instructions on his PCP if she cannot go back to work on Thursday. Note: This dictation was prepared with Dragon dictation along with smaller phrase technology. Any transcriptional errors that result from this process are unintentional.  Hassan Rowan, MD 09/17/15 [phone]

## 2015-09-18 ENCOUNTER — Telehealth: Payer: Self-pay | Admitting: Internal Medicine

## 2015-09-18 DIAGNOSIS — Z0000 Encounter for general adult medical examination without abnormal findings: Secondary | ICD-10-CM

## 2015-09-18 NOTE — Telephone Encounter (Signed)
Orders placed.

## 2015-09-18 NOTE — Telephone Encounter (Signed)
Pt would like to get lab work before her Physical appt on 12/05/2015. Call pt @ [phone]. Thank you!

## 2015-09-18 NOTE — Telephone Encounter (Signed)
Where does she get her labs done?

## 2015-09-18 NOTE — Telephone Encounter (Signed)
Rx was filled yesterday at [facility], Advised pt not to take ibuprofen and Meloxicam together.

## 2015-09-18 NOTE — Telephone Encounter (Signed)
Pt gets labs done at our office.

## 2015-09-18 NOTE — Telephone Encounter (Signed)
Pt is reuqesting lab orders be placed before her physical. Please advise, thanks

## 2015-09-19 NOTE — Telephone Encounter (Signed)
Pt scheduled for labs on 11/30/15

## 2015-09-21 ENCOUNTER — Telehealth: Payer: Self-pay

## 2015-09-21 NOTE — Telephone Encounter (Signed)
Pt states that she was seen in Urgent Care for flu-like symptoms and given a doctors not for three days off, they stated that they could only write her a not for the three days, she was told to contact her PCP for additional time off. Pt feels dizzy and off balance while taking this  medication and she works in [employer] and [employer] service. She wants to know if we can write a doctors note for additional time off until she is finished taking the course of tamiflu, she currently has two days left of this medication. Pt will be retuning to work on Monday 09/24/15. Please advise if okay to write an additional doctors note.

## 2015-09-21 NOTE — Telephone Encounter (Signed)
Needs to be seen for any work note.

## 2015-09-21 NOTE — Telephone Encounter (Signed)
Pt scheduled to come in on Monday

## 2015-09-24 ENCOUNTER — Encounter: Payer: Self-pay | Admitting: Internal Medicine

## 2015-09-24 ENCOUNTER — Ambulatory Visit (INDEPENDENT_AMBULATORY_CARE_PROVIDER_SITE_OTHER): Payer: Federal, State, Local not specified - PPO | Admitting: Internal Medicine

## 2015-09-24 VITALS — BP 128/83 | HR 73 | Temp 98.2°F | Wt 243.2 lb

## 2015-09-24 DIAGNOSIS — J101 Influenza due to other identified influenza virus with other respiratory manifestations: Secondary | ICD-10-CM

## 2015-09-24 DIAGNOSIS — J111 Influenza due to unidentified influenza virus with other respiratory manifestations: Secondary | ICD-10-CM | POA: Diagnosis not present

## 2015-09-24 HISTORY — DX: Influenza due to other identified influenza virus with other respiratory manifestations: J10.1

## 2015-09-24 NOTE — Progress Notes (Signed)
Pre visit review using our clinic review tool, if applicable. No additional management support is needed unless otherwise documented below in the visit note. 

## 2015-09-24 NOTE — Patient Instructions (Signed)

## 2015-09-24 NOTE — Assessment & Plan Note (Signed)
Influenza A. Treated with Tamiflu. Continues to have productive cough, fatigue, emesis. However, generally, improving. Encouraged rest, adequate fluids. Continue prn Tussionex. Follow up recheck in 1 week and prn. Return precautions given.

## 2015-09-24 NOTE — Progress Notes (Signed)
Subjective:    Patient ID: Hannah Robinson, female    DOB: 12/14/1965, 50 y.o.   MRN: [id number]  HPI  49YO female presents for follow up.  Recently treated for Flu. Last week. Needs work note for [employer], Fri, Sat. Unable to work because of dizziness. Felt nauseous on Tamiflu.  Continues to be fatigued. Had cough with post-tussive emesis twice today. Tolerating food and liquids. Taking Tussionex for cough.   Wt Readings from Last 3 Encounters:  09/24/15 243 lb 4 oz (110.337 kg)  09/17/15 238 lb (107.956 kg)  08/08/15 238 lb (107.956 kg)   BP Readings from Last 3 Encounters:  09/24/15 128/83  09/17/15 135/82  08/08/15 143/84    Past Medical History  Diagnosis Date  . Childhood asthma   . Urinary tract bacterial infections   . Diabetes mellitus     borderline  . Anemia     iron infusions in past  . Iritis     Followed by Dr. Marti Sleigh   Family History  Problem Relation Age of Onset  . Lung cancer Maternal Aunt   . Alcohol abuse Maternal Aunt   . Asthma Son   . Clotting disorder Mother   . Arthritis Mother   . Heart disease Mother   . Bipolar disorder Brother   . Stroke Maternal Grandmother   . Cancer Maternal Aunt     lung   Past Surgical History  Procedure Laterality Date  . Breast surgery  1988    benign  . Vaginal delivery      2   Social History   Social History  . Marital Status: Single    Spouse Name: N/A  . Number of Children: 2  . 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 Smoking status: Never Smoker   . Smokeless tobacco: Never Used  . Alcohol Use: 2.0 oz/week    4 drink(s) per week     Comment: Wine  . Drug Use: No  . Sexual Activity: Not Asked   Other Topics Concern  . None   Social History Narrative   Lives in [place] with sons, from [place].      Work - Runner, broadcasting/film/video   Diet - regular   Exercise - walks with work    Review of Systems  Constitutional: Positive for  fatigue. Negative for fever, chills and unexpected weight change.  HENT: Positive for congestion, postnasal drip, rhinorrhea, sneezing and sore throat. Negative for ear discharge, ear pain, facial swelling, hearing loss, mouth sores, nosebleeds, sinus pressure, tinnitus, trouble swallowing and voice change.   Eyes: Negative for pain, discharge, redness and visual disturbance.  Respiratory: Positive for cough. Negative for chest tightness, shortness of breath, wheezing and stridor.   Cardiovascular: Negative for chest pain, palpitations and leg swelling.  Gastrointestinal: Positive for vomiting. Negative for nausea, abdominal pain, diarrhea, constipation and blood in stool.  Musculoskeletal: Negative for myalgias, arthralgias, neck pain and neck stiffness.  Skin: Negative for color change and rash.  Neurological: Negative for dizziness, weakness, light-headedness and headaches.  Hematological: Negative for adenopathy.       Objective:    BP 128/83 mmHg  Pulse 73  Temp(Src) 98.2 F (36.8 C) (Oral)  Wt 243 lb 4 oz (110.337 kg)  SpO2 99% Physical Exam  Constitutional: She is oriented to person, place, and time. She appears well-developed and well-nourished. No distress.  HENT:  Head: Normocephalic and atraumatic.  Right Ear: External ear normal.  Left Ear: External ear normal.  Nose: Nose normal.  Mouth/Throat: Oropharynx is clear and moist. No oropharyngeal exudate.  Eyes: Conjunctivae are normal. Pupils are equal, round, and reactive to light. Right eye exhibits no discharge. Left eye exhibits no discharge. No scleral icterus.  Neck: Normal range of motion. Neck supple. No tracheal deviation present. No thyromegaly present.  Cardiovascular: Normal rate, regular rhythm, normal heart sounds and intact distal pulses.  Exam reveals no gallop and no friction rub.   No murmur heard. Pulmonary/Chest: Effort normal. No accessory muscle usage. No tachypnea. No respiratory distress. She has no  decreased breath sounds. She has wheezes (rare). She has rhonchi (scattered). She has no rales. She exhibits no tenderness.  Musculoskeletal: Normal range of motion. She exhibits no edema or tenderness.  Lymphadenopathy:    She has no cervical adenopathy.  Neurological: She is alert and oriented to person, place, and time. No cranial nerve deficit. She exhibits normal muscle tone. Coordination normal.  Skin: Skin is warm and dry. No rash noted. She is not diaphoretic. No erythema. No pallor.  Psychiatric: She has a normal mood and affect. Her behavior is normal. Judgment and thought content normal.          Assessment & Plan:   Problem List Items Addressed This Visit      Unprioritized   Influenza A with respiratory manifestations - Primary    Influenza A. Treated with Tamiflu. Continues to have productive cough, fatigue, emesis. However, generally, improving. Encouraged rest, adequate fluids. Continue prn Tussionex. Follow up recheck in 1 week and prn. Return precautions given.          Return in about 1 week (around 10/01/2015), or if symptoms worsen or fail to improve, for Recheck.  Ronna PolioJennifer Walker, MD Internal Medicine [facility]

## 2015-10-02 ENCOUNTER — Ambulatory Visit: Payer: Federal, State, Local not specified - PPO | Admitting: Internal Medicine

## 2015-10-12 ENCOUNTER — Encounter: Payer: Self-pay | Admitting: Family Medicine

## 2015-10-12 ENCOUNTER — Ambulatory Visit (INDEPENDENT_AMBULATORY_CARE_PROVIDER_SITE_OTHER): Payer: Federal, State, Local not specified - PPO | Admitting: Family Medicine

## 2015-10-12 VITALS — BP 112/74 | HR 79 | Temp 98.1°F | Ht 65.5 in

## 2015-10-12 DIAGNOSIS — R300 Dysuria: Secondary | ICD-10-CM | POA: Diagnosis not present

## 2015-10-12 DIAGNOSIS — M25561 Pain in right knee: Secondary | ICD-10-CM

## 2015-10-12 LAB — POCT URINALYSIS DIPSTICK
Bilirubin, UA: NEGATIVE
Blood, UA: NEGATIVE
Glucose, UA: NEGATIVE
Ketones, UA: NEGATIVE
LEUKOCYTES UA: NEGATIVE
Nitrite, UA: NEGATIVE
Spec Grav, UA: >=1.03
UROBILINOGEN UA: NEGATIVE
pH, UA: 6

## 2015-10-12 MED ORDER — PREDNISONE 10 MG (21) PO TBPK: 10.0000 mg | ORAL_TABLET | Freq: Every day | ORAL | Status: DC

## 2015-10-12 NOTE — Progress Notes (Signed)
Pre visit review using our clinic review tool, if applicable. No additional management support is needed unless otherwise documented below in the visit note. 

## 2015-10-12 NOTE — Patient Instructions (Addendum)
Take the prednisone as prescribed.  You can get a knee sleeve or compression stockings as well (try [facility]).   There was no evidence of UTI. We will call with the culture results.  Follow up closely with Dr. Dan Humphreys  Take care  Dr. Adriana Simas    Pes Anserinus Syndrome With Rehab The pes anserine, also known as the goose's foot, is an area of the shinbone (tibia) near the knee joint where the tendons of three of the muscles of the thigh insert into the bone. These muscles are important for bending the knee and bringing the leg across the body. Just underneath the three tendons that attach at the pes anserinus exists a fluid filled sac (bursa) that is meant to reduce the friction between the tendons and the tibia. Pes anserinus syndrome is a condition that is characterized by inflammation of the bursa (bursitis) and/ or tendonitis (inflammation of the tendon) and may cause severe pain in the lower portion of the inner (medial) side of the knee. SYMPTOMS   Pain and inflammation over the lower portion of the medial side of the knee.  Pain that worsens as the duration of an activity increases.  Pain that worsens when bending the knee, especially against resistance.  A crackling sound (crepitation) when the tendon or bursa is moved or touched. CAUSES  Bursitis and tendonitis are usually characterized as overuse injuries. Common mechanisms of injury include:  Stress placed on the knee from a sudden increase in the intensity, frequency, or duration of training.  Direct trauma to the upper leg (less common). RISK INCREASES WITH:  Endurance sports (distance running or triathletes).  Making changes to or beginning a new training program.  Sports that place stress on the muscles that insert at the pes anserinus, such as those that require pivoting, cutting, or jumping.  Improper training.  Poor strength and flexibility  Failure to warm-up properly before activity.  Improper knee  alignment ( knock knees).  Arthritis of the knee. PREVENTION  Warm up and stretch properly before activity.  Allow for adequate recovery between workouts.  Maintain physical fitness:  Strength, flexibility, and endurance.  Cardiovascular fitness.  Learn and use training methods that will reduce the stress placed on the pes anserinus.  Arch supports (orthotics) may be helpful for those with flat feet. PROGNOSIS  If treated properly, then the symptoms of pes anserinus syndrome usually resolve within 6 weeks.  RELATED COMPLICATIONS   Persistent and potentially chronic pain if the condition is not treated properly.  Re-injury if activity is resumed before the injury is allowed to heal completely, or if one resumes improper training habits. TREATMENT Treatment initially involves the use of ice and medication to help reduce pain and inflammation. The use of strengthening and stretching exercises may help reduce pain with activity. These exercises may be performed at home or with a therapist. Individuals who have flat feet may find benefit in wearing arch supports in their shoes. Some individuals find that compression bandages or knee sleeves help reduce symptoms. Your caregiver may recommend a corticosteroid injection to help reduce inflammation. If symptoms persist, despite conservative treatment for greater than 6 months, then surgery may be recommended.  MEDICATION   If pain medication is necessary, then nonsteroidal anti-inflammatory medications, such as aspirin and ibuprofen, or other minor pain relievers, such as acetaminophen, are often recommended.  Do not take pain medication for 7 days before surgery.  Prescription pain relievers may be given if deemed necessary by your caregiver.  Use only as directed and only as much as you need.  Corticosteroid injections may be given by your caregiver. These injections should be reserved for the most serious cases, because they may only be  given a certain number of times. SEEK MEDICAL CARE IF:  Treatment seems to offer no benefit, or the condition worsens.  Any medications produce adverse side effects. EXERCISES  RANGE OF MOTION (ROM) AND STRETCHING EXERCISES - Pes Anserinus Syndrome These exercises may help you when beginning to rehabilitate your injury. Your symptoms may resolve with or without further involvement from your physician, physical therapist or athletic trainer. While completing these exercises, remember:   Restoring tissue flexibility helps normal motion to return to the joints. This allows healthier, less painful movement and activity.  An effective stretch should be held for at least 30 seconds.  A stretch should never be painful. You should only feel a gentle lengthening or release in the stretched tissue. STRETCH - Hamstrings, Supine  Lie on your back. Loop a belt or towel over the ball of your right / left foot.  Straighten your right / left knee and slowly pull on the belt to raise your leg. Do not allow the right / left knee to bend. Keep your opposite leg flat on the floor.  Raise the leg until you feel a gentle stretch behind your right / left knee or thigh. Hold this position for __________ seconds. Repeat __________ times. Complete this stretch __________ times per day.  STRETCH - Hamstrings, Doorway  Lie on your back with your right / left leg extended and resting on the wall and the opposite leg flat on the ground through the door. Initially, position your bottom farther away from the wall than the illustration shows.  Keep your right / left knee straight. If you feel a stretch behind your knee or thigh, hold this position for __________ seconds.  If you do not feel a stretch, scoot your bottom closer to the door, and hold __________ seconds. Repeat __________ times. Complete this stretch __________ times per day.  STRETCH - Hamstrings/Adductors, V-Sit  Sit on the floor with your legs  extended in a large "V," keeping your knees straight.  With your head and chest upright, bend at your waist reaching for your right foot to stretch your left adductors.  You should feel a stretch in your left inner thigh. Hold for __________ seconds.  Return to the upright position to relax your leg muscles.  Continuing to keep your chest upright, bend straight forward at your waist to stretch your hamstrings.  You should feel a stretch behind both of your thighs and/or knees. Hold for __________ seconds.  Return to the upright position to relax your leg muscles.  Repeat steps 2 through 4. Repeat __________ times. Complete this exercise __________ times per day.  STRETCH - Hamstrings, Standing  Stand or sit and extend your right / left leg, placing your foot on a chair or foot stool  Keeping a slight arch in your low back and your hips straight forward.  Lead with your chest and lean forward at the waist until you feel a gentle stretch in the back of your right / left knee or thigh. (When done correctly, this exercise requires leaning only a small distance.)  Hold this position for __________ seconds. Repeat __________ times. Complete this stretch __________ times per day. STRETCH - Adductors, Lunge  While standing, spread your legs  Lean away from your right / left leg by  bending your opposite knee. You may rest your hands on your thigh for balance.  You should feel a stretch in your right / left inner thigh. Hold for __________ seconds. Repeat __________ times. Complete this exercise __________ times per day.  STRETCH - Adductors, Standing  Place your right / left foot on a counter or stable table. Turn away from your leg so both hips line up with your right / left leg.  Keeping your hips facing forward, slowly bend your opposite leg until you feel a gentle stretch on the inside of your right / left thigh.  Hold for __________ seconds. Repeat __________ times. Complete this  exercise __________ times per day.  STRENGTHENING EXERCISES - Pes Anserinus Syndrome  These exercises may help you when beginning to rehabilitate your injury. They may resolve your symptoms with or without further involvement from your physician, physical therapist or athletic trainer. While completing these exercises, remember:   Muscles can gain both the endurance and the strength needed for everyday activities through controlled exercises.  Complete these exercises as instructed by your physician, physical therapist or athletic trainer. Progress the resistance and repetitions only as guided. STRENGTH - Hamstring, Curls  Lay on your stomach with your legs extended. (If you lay on a bed, your feet may hang over the edge.)  Tighten the muscles in the back of your thigh to bend your right / left knee up to 90 degrees. Keep your hips flat on the bed/floor.  Hold this position for __________ seconds.  Slowly lower your leg back to the starting position. Repeat __________ times. Complete this exercise __________ times per day.  OPTIONAL ANKLE WEIGHTS: Begin with ____________________, but DO NOT exceed ____________________. Increase in 1 lb/0.5 kg increments.  STRENGTH - Hip Adductors, Straight Leg Raises  Lie on your side so that your head, shoulders, knee and hip line up. You may place your upper foot in front to help maintain your balance. Your right / left leg should be on the bottom.  Roll your hips slightly forward, so that your hips are stacked directly over each other and your right / left knee is facing forward.  Tense the muscles in your inner thigh and lift your bottom leg 4-6 inches. Hold this position for __________ seconds.  Slowly lower your leg to the starting position. Allow the muscles to fully relax before beginning the next repetition. Repeat __________ times. Complete this exercise __________ times per day.    This information is not intended to replace advice given to  you by your health care provider. Make sure you discuss any questions you have with your health care provider.   Document Released: 07/07/2005 Document Revised: 11/21/2014 Document Reviewed: 10/19/2008 Elsevier Interactive Patient Education [employer]

## 2015-10-12 NOTE — Assessment & Plan Note (Signed)
New problem. Urinalysis unremarkable. Sending culture.

## 2015-10-12 NOTE — Assessment & Plan Note (Signed)
New problem. Recent x-ray was reviewed independently (interpretation: No acute abnormality. No evidence of OA. Normal x-ray). Clinical exam consistent with pes anserine bursitis. Treating with prednisone. Exercises also given w/ handout.

## 2015-10-12 NOTE — Progress Notes (Signed)
Subjective:  Patient ID: Hannah Robinson, female    DOB: 10/21/1965  Age: 50 y.o. MRN: [id number]  CC: Right knee pain, ? UTI  HPI:  50 year old female presents to clinic today with the above complaints.  Right knee pain  Has been going on for the past 2 months. No reported fall, trauma or injury. No change in activity.  [facility]. She received an x-ray which was normal. I personally reviewed the film today. Interpretation: No acute abnormality. No evidence of underlying osteoarthritis. Normal x-ray. She was placed on an anti-inflammatory and instructed follow with her PCP.  She presents today reporting continued right knee pain.  Pain is located medially.  She also reports some associated calf discomfort.  She has taken ibuprofen with little improvement.  She states it is worse with activity. 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  She is requesting something additional for the pain today.  Dysuria  1 week.  She reports slight burning with urination. No frequency, urgency. No fever or flank pain.  No known exacerbating or relieving factors.  Patient is menopausal.  Social Hx   Social History   Social History  . Marital Status: Single    Spouse Name: N/A  . Number of Children: 2  . 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 Smoking status: Never Smoker   . Smokeless tobacco: Never Used  . Alcohol Use: 2.0 oz/week    4 drink(s) per week     Comment: Wine  . Drug Use: No  . Sexual Activity: Not Asked   Other Topics Concern  . None   Social History Narrative   [place] sons, from [place].      Work - Runner, broadcasting/film/videoDelivers mail   Diet - regular   Exercise - walks with work   Review of Systems  Constitutional: Negative.   Musculoskeletal:       Right knee pain.   Objective:  BP 112/74 mmHg  Pulse 79  Temp(Src) 98.1  F (36.7 C) (Oral)  Ht 5' 5.5" (1.664 m)  SpO2 98%  BP/Weight 10/12/2015 09/24/2015 09/17/2015  Systolic BP 112 128 135  Diastolic BP 74 83 82  Wt. (Lbs) - 243.25 238  BMI - 39.28 38.43   Physical Exam  Constitutional: She is oriented to person, place, and time. She appears well-developed. No distress.  Pulmonary/Chest: Effort normal.  Musculoskeletal:  Knee: Right Normal to inspection with no erythema or effusion or obvious bony abnormalities.  Palpation - medial lower portion of the knee tender to palpation (area of the pes anserine bursa). No joint line tenderness. Ligaments with solid consistent endpoints including ACL, PCL, LCL, MCL.  Neurological: She is alert and oriented to person, place, and time.  Psychiatric: She has a normal mood and affect.  Vitals reviewed.  Lab Results  Component Value Date   WBC 3.8* 12/04/2014   HGB 12.9 12/04/2014   HCT 38.8 12/04/2014   PLT 231.0 12/04/2014   GLUCOSE 84 12/04/2014   CHOL 151 12/04/2014   TRIG 34.0 12/04/2014   HDL 68.50 12/04/2014   LDLCALC 76 12/04/2014   ALT 15 12/04/2014   AST 19 12/04/2014   NA 138 12/04/2014   K 4.2 12/04/2014   CL 105 12/04/2014   CREATININE 0.62 12/04/2014   BUN 13 12/04/2014   CO2 29  12/04/2014   TSH 0.86 12/04/2014   HGBA1C 6.0 12/04/2014   MICROALBUR 1.1 12/04/2014    Assessment & Plan:   Problem List Items Addressed This Visit    Right knee pain - Primary    New problem. Recent x-ray was reviewed independently (interpretation: No acute abnormality. No evidence of OA. Normal x-ray). Clinical exam consistent with pes anserine bursitis. Treating with prednisone. Exercises also given w/ handout.      Relevant Orders   POCT Urinalysis Dipstick (Completed)   Dysuria    New problem. Urinalysis unremarkable. Sending culture.      Relevant Orders   Urine Culture      Meds ordered this encounter  Medications  . predniSONE (STERAPRED UNI-PAK 21 TAB) 10 MG (21) TBPK tablet     Sig: Take 1 tablet (10 mg total) by mouth daily. 6 tablets today; then decreased by 1 tablet daily until gone.    Dispense:  21 tablet    Refill:  0   Follow-up: PRN  Everlene Other DO [facility]

## 2015-10-13 LAB — URINE CULTURE
COLONY COUNT: NO GROWTH
ORGANISM ID, BACTERIA: NO GROWTH

## 2015-11-28 DIAGNOSIS — M25561 Pain in right knee: Secondary | ICD-10-CM | POA: Diagnosis not present

## 2015-11-29 ENCOUNTER — Other Ambulatory Visit: Payer: Self-pay | Admitting: Internal Medicine

## 2015-11-29 ENCOUNTER — Other Ambulatory Visit: Payer: Self-pay | Admitting: *Deleted

## 2015-11-29 ENCOUNTER — Telehealth: Payer: Self-pay | Admitting: *Deleted

## 2015-11-29 ENCOUNTER — Other Ambulatory Visit (HOSPITAL_COMMUNITY)
Admission: RE | Admit: 2015-11-29 | Discharge: 2015-11-29 | Disposition: A | Discharge status: Home / Self Care | Payer: Federal, State, Local not specified - PPO | Source: Ambulatory Visit | Attending: Internal Medicine | Admitting: Internal Medicine

## 2015-11-29 ENCOUNTER — Other Ambulatory Visit (INDEPENDENT_AMBULATORY_CARE_PROVIDER_SITE_OTHER): Payer: Federal, State, Local not specified - PPO

## 2015-11-29 DIAGNOSIS — N760 Acute vaginitis: Secondary | ICD-10-CM | POA: Diagnosis not present

## 2015-11-29 DIAGNOSIS — Z113 Encounter for screening for infections with a predominantly sexual mode of transmission: Secondary | ICD-10-CM | POA: Insufficient documentation

## 2015-11-29 DIAGNOSIS — Z0000 Encounter for general adult medical examination without abnormal findings: Secondary | ICD-10-CM

## 2015-11-29 LAB — CBC WITH DIFFERENTIAL/PLATELET
BASOS ABS: 0 10*3/uL (ref 0.0–0.1)
Basophils Relative: 0.6 % (ref 0.0–3.0)
EOS PCT: 10.6 % — AB (ref 0.0–5.0)
Eosinophils Absolute: 0.4 10*3/uL (ref 0.0–0.7)
HEMATOCRIT: 37.6 % (ref 36.0–46.0)
HEMOGLOBIN: 12.2 g/dL (ref 12.0–15.0)
LYMPHS ABS: 1.4 10*3/uL (ref 0.7–4.0)
LYMPHS PCT: 41.9 % (ref 12.0–46.0)
MCHC: 32.6 g/dL (ref 30.0–36.0)
MCV: 81 fl (ref 78.0–100.0)
MONOS PCT: 8.7 % (ref 3.0–12.0)
Monocytes Absolute: 0.3 10*3/uL (ref 0.1–1.0)
Neutro Abs: 1.3 10*3/uL — ABNORMAL LOW (ref 1.4–7.7)
Neutrophils Relative %: 38.2 % — ABNORMAL LOW (ref 43.0–77.0)
Platelets: 257 10*3/uL (ref 150.0–400.0)
RBC: 4.64 Mil/uL (ref 3.87–5.11)
RDW: 16.3 % — AB (ref 11.5–15.5)
WBC: 3.4 10*3/uL — AB (ref 4.0–10.5)

## 2015-11-29 LAB — COMPREHENSIVE METABOLIC PANEL
ALBUMIN: 4 g/dL (ref 3.5–5.2)
ALK PHOS: 69 U/L (ref 39–117)
ALT: 11 U/L (ref 0–35)
AST: 17 U/L (ref 0–37)
BUN: 14 mg/dL (ref 6–23)
CALCIUM: 9.1 mg/dL (ref 8.4–10.5)
CHLORIDE: 106 meq/L (ref 96–112)
CO2: 27 mEq/L (ref 19–32)
CREATININE: 0.65 mg/dL (ref 0.40–1.20)
GFR: 123.78 mL/min (ref >60.00)
Glucose, Bld: 103 mg/dL — ABNORMAL HIGH (ref 70–99)
Potassium: 4.3 mEq/L (ref 3.5–5.1)
Sodium: 142 mEq/L (ref 135–145)
Total Bilirubin: 0.6 mg/dL (ref 0.2–1.2)
Total Protein: 7.1 g/dL (ref 6.0–8.3)

## 2015-11-29 LAB — HEMOGLOBIN A1C: HEMOGLOBIN A1C: 5.9 % (ref 4.6–6.5)

## 2015-11-29 LAB — LIPID PANEL
CHOLESTEROL: 158 mg/dL (ref 0–200)
HDL: 54.8 mg/dL (ref >39.00)
LDL CALC: 94 mg/dL (ref 0–99)
NonHDL: 103.4
TRIGLYCERIDES: 49 mg/dL (ref 0.0–149.0)
Total CHOL/HDL Ratio: 3
VLDL: 9.8 mg/dL (ref 0.0–40.0)

## 2015-11-29 LAB — TSH: TSH: 1.18 u[IU]/mL (ref 0.35–4.50)

## 2015-11-29 LAB — VITAMIN D 25 HYDROXY (VIT D DEFICIENCY, FRACTURES): VITD: 20.11 ng/mL — ABNORMAL LOW (ref 30.00–100.00)

## 2015-11-29 NOTE — Telephone Encounter (Signed)
Does she want blood work for STDs? If yes, add HIV, RPR, Hep C antibody and Hep B antigen. Urine GC/CHL.

## 2015-11-29 NOTE — Telephone Encounter (Signed)
Pt wanted to add a std panel and a urine to check for stds as well- i collected urine

## 2015-11-29 NOTE — Addendum Note (Signed)
Addended by: Montine CircleMALDONADO, Aniza Shor D on: 11/29/2015 10:00 AM   Modules accepted: Orders

## 2015-11-30 ENCOUNTER — Other Ambulatory Visit: Payer: Federal, State, Local not specified - PPO

## 2015-11-30 LAB — URINE CYTOLOGY ANCILLARY ONLY
Chlamydia: NEGATIVE
Neisseria Gonorrhea: NEGATIVE
Trichomonas: NEGATIVE

## 2015-11-30 LAB — STD PANEL
HEP B S AG: NEGATIVE
HIV: NONREACTIVE

## 2015-11-30 NOTE — Telephone Encounter (Signed)
Ferritin is a more accurate way to assess iron stores. Can use iron deficiency anemia

## 2015-11-30 NOTE — Telephone Encounter (Signed)
She stated that her iron is always low....Marland Kitchen.for the ferritin what dx?

## 2015-11-30 NOTE — Telephone Encounter (Signed)
No. We can add a ferritin if she is worried about iron stores.

## 2015-11-30 NOTE — Telephone Encounter (Signed)
Pt would like to add an Iron

## 2015-11-30 NOTE — Telephone Encounter (Signed)
Called [facility] for add on

## 2015-12-03 ENCOUNTER — Telehealth: Payer: Self-pay

## 2015-12-03 ENCOUNTER — Telehealth: Payer: Self-pay | Admitting: Internal Medicine

## 2015-12-03 LAB — FERRITIN

## 2015-12-03 LAB — URINE CYTOLOGY ANCILLARY ONLY: Candida vaginitis: NEGATIVE

## 2015-12-03 MED ORDER — METRONIDAZOLE 500 MG PO TABS: 500.0000 mg | ORAL_TABLET | Freq: Two times a day (BID) | ORAL | Status: DC

## 2015-12-03 NOTE — Telephone Encounter (Signed)
Patient aware of urine culture results.  Metronidazole sent to pharmacy.

## 2015-12-03 NOTE — Telephone Encounter (Signed)
Lab called not enough blood to add on ferritin

## 2015-12-03 NOTE — Telephone Encounter (Signed)
-----   Message from Shelia MediaJennifer A Walker, MD sent at 12/03/2015  2:57 PM EDT ----- Sample showed vaginal infection called bacterial vaginosis. I would recommend starting Metronidazole 500mg  twice daily #14. No refills. Please let her know that she cannot drink alcohol while on this medication.

## 2015-12-05 ENCOUNTER — Encounter: Payer: Federal, State, Local not specified - PPO | Admitting: Internal Medicine

## 2015-12-07 ENCOUNTER — Ambulatory Visit (INDEPENDENT_AMBULATORY_CARE_PROVIDER_SITE_OTHER): Payer: Federal, State, Local not specified - PPO | Admitting: Internal Medicine

## 2015-12-07 ENCOUNTER — Telehealth: Payer: Self-pay | Admitting: Internal Medicine

## 2015-12-07 ENCOUNTER — Encounter: Payer: Self-pay | Admitting: Internal Medicine

## 2015-12-07 VITALS — BP 124/82 | HR 95 | Ht 66.0 in | Wt 245.0 lb

## 2015-12-07 DIAGNOSIS — Z1239 Encounter for other screening for malignant neoplasm of breast: Secondary | ICD-10-CM | POA: Diagnosis not present

## 2015-12-07 DIAGNOSIS — L989 Disorder of the skin and subcutaneous tissue, unspecified: Secondary | ICD-10-CM

## 2015-12-07 DIAGNOSIS — B372 Candidiasis of skin and nail: Secondary | ICD-10-CM | POA: Diagnosis not present

## 2015-12-07 DIAGNOSIS — M25561 Pain in right knee: Secondary | ICD-10-CM

## 2015-12-07 DIAGNOSIS — Z0001 Encounter for general adult medical examination with abnormal findings: Secondary | ICD-10-CM

## 2015-12-07 DIAGNOSIS — Z1211 Encounter for screening for malignant neoplasm of colon: Secondary | ICD-10-CM | POA: Diagnosis not present

## 2015-12-07 DIAGNOSIS — Z0000 Encounter for general adult medical examination without abnormal findings: Secondary | ICD-10-CM

## 2015-12-07 MED ORDER — CLOTRIMAZOLE-BETAMETHASONE 1-0.05 % EX CREA: 1.0000 "application " | TOPICAL_CREAM | Freq: Two times a day (BID) | CUTANEOUS | Status: DC

## 2015-12-07 MED ORDER — BUDESONIDE-FORMOTEROL FUMARATE 80-4.5 MCG/ACT IN AERO: INHALATION_SPRAY | RESPIRATORY_TRACT | Status: DC

## 2015-12-07 MED ORDER — ALBUTEROL SULFATE HFA 108 (90 BASE) MCG/ACT IN AERS: 2.0000 | INHALATION_SPRAY | RESPIRATORY_TRACT | Status: DC | PRN

## 2015-12-07 NOTE — Assessment & Plan Note (Signed)
General medical exam including breast exam normal today except as noted. PAP and pelvic deferred as normal in 2015, HPV neg, plan repeat in 2018. Mammogram ordered. Colonoscopy ordered. Immunizations UTD except flu which was declined. Encouraged healthy diet and exercise.

## 2015-12-07 NOTE — Assessment & Plan Note (Signed)
Hyperpigmented skin lesions on feet. Most consistent with scarring after insect bite or irritation by shoe. Recommended dermatology evaluation if lesions persist.

## 2015-12-07 NOTE — Progress Notes (Signed)
Pre visit review using our clinic review tool, if applicable. No additional management support is needed unless otherwise documented below in the visit note. 

## 2015-12-07 NOTE — Telephone Encounter (Signed)
Left message advising patient to only use topical steroid 1-2 weeks.

## 2015-12-07 NOTE — Assessment & Plan Note (Signed)
Rash under breasts consistent with candidal skin infection. Will start topical Lotrisone given extreme itching. She will call or return to clinic if symptoms are not improving.

## 2015-12-07 NOTE — Patient Instructions (Addendum)
Labs in 4 weeks to recheck white blood cell count.  Health Maintenance, Female Adopting a healthy lifestyle and getting preventive care can go a long way to promote health and wellness. Talk with your health care provider about what schedule of regular examinations is right for you. This is a good chance for you to check in with your provider about disease prevention and staying healthy. In between checkups, there are plenty of things you can do on your own. Experts have done a lot of research about which lifestyle changes and preventive measures are most likely to keep you healthy. Ask your health care provider for more information. WEIGHT AND DIET  Eat a healthy diet  Be sure to include plenty of vegetables, fruits, low-fat dairy products, and lean protein.  Do not eat a lot of foods high in solid fats, added sugars, or salt.  Get regular exercise. This is one of the most important things you can do for your health.  Most adults should exercise for at least 150 minutes each week. The exercise should increase your heart rate and make you sweat (moderate-intensity exercise).  Most adults should also do strengthening exercises at least twice a week. This is in addition to the moderate-intensity exercise.  Maintain a healthy weight  Body mass index (BMI) is a measurement that can be used to identify possible weight problems. It estimates body fat based on height and weight. Your health care provider can help determine your BMI and help you achieve or maintain a healthy weight.  For females 34 years of age and older:   A BMI below 18.5 is considered underweight.  A BMI of 18.5 to 24.9 is normal.  A BMI of 25 to 29.9 is considered overweight.  A BMI of 30 and above is considered obese.  Watch levels of cholesterol and blood lipids  You should start having your blood tested for lipids and cholesterol at 51 years of age, then have this test every 5 years.  You may need to have your  cholesterol levels checked more often if:  Your lipid or cholesterol levels are high.  You are older than 52 years of age.  You are at high risk for heart disease.  CANCER SCREENING   Lung Cancer  Lung cancer screening is recommended for adults 44-69 years old who are at high risk for lung cancer because of a history of smoking.  A yearly low-dose CT scan of the lungs is recommended for people who:  Currently smoke.  Have quit within the past 15 years.  Have at least a 30-pack-year history of smoking. A pack year is smoking an average of one pack of cigarettes a day for 1 year.  Yearly screening should continue until it has been 15 years since you quit.  Yearly screening should stop if you develop a health problem that would prevent you from having lung cancer treatment.  Breast Cancer  Practice breast self-awareness. This means understanding how your breasts normally appear and feel.  It also means doing regular breast self-exams. Let your health care provider know about any changes, no matter how small.  If you are in your 20s or 30s, you should have a clinical breast exam (CBE) by a health care provider every 1-3 years as part of a regular health exam.  If you are 79 or older, have a CBE every year. Also consider having a breast X-ray (mammogram) every year.  If you have a family history of breast cancer, talk  to your health care provider about genetic screening.  If you are at high risk for breast cancer, talk to your health care provider about having an MRI and a mammogram every year.  Breast cancer gene (BRCA) assessment is recommended for women who have family members with BRCA-related cancers. BRCA-related cancers include:  Breast.  Ovarian.  Tubal.  Peritoneal cancers.  Results of the assessment will determine the need for genetic counseling and BRCA1 and BRCA2 testing. Cervical Cancer Your health care provider may recommend that you be screened regularly  for cancer of the pelvic organs (ovaries, uterus, and vagina). This screening involves a pelvic examination, including checking for microscopic changes to the surface of your cervix (Pap test). You may be encouraged to have this screening done every 3 years, beginning at age 61.  For women ages 66-65, health care providers may recommend pelvic exams and Pap testing every 3 years, or they may recommend the Pap and pelvic exam, combined with testing for human papilloma virus (HPV), every 5 years. Some types of HPV increase your risk of cervical cancer. Testing for HPV may also be done on women of any age with unclear Pap test results.  Other health care providers may not recommend any screening for nonpregnant women who are considered low risk for pelvic cancer and who do not have symptoms. Ask your health care provider if a screening pelvic exam is right for you.  If you have had past treatment for cervical cancer or a condition that could lead to cancer, you need Pap tests and screening for cancer for at least 20 years after your treatment. If Pap tests have been discontinued, your risk factors (such as having a new sexual partner) need to be reassessed to determine if screening should resume. Some women have medical problems that increase the chance of getting cervical cancer. In these cases, your health care provider may recommend more frequent screening and Pap tests. Colorectal Cancer  This type of cancer can be detected and often prevented.  Routine colorectal cancer screening usually begins at 51 years of age and continues through 51 years of age.  Your health care provider may recommend screening at an earlier age if you have risk factors for colon cancer.  Your health care provider may also recommend using home test kits to check for hidden blood in the stool.  A small camera at the end of a tube can be used to examine your colon directly (sigmoidoscopy or colonoscopy). This is done to check  for the earliest forms of colorectal cancer.  Routine screening usually begins at age 109.  Direct examination of the colon should be repeated every 5-10 years through 51 years of age. However, you may need to be screened more often if early forms of precancerous polyps or small growths are found. Skin Cancer  Check your skin from head to toe regularly.  Tell your health care provider about any new moles or changes in moles, especially if there is a change in a mole's shape or color.  Also tell your health care provider if you have a mole that is larger than the size of a pencil eraser.  Always use sunscreen. Apply sunscreen liberally and repeatedly throughout the day.  Protect yourself by wearing long sleeves, pants, a wide-brimmed hat, and sunglasses whenever you are outside. HEART DISEASE, DIABETES, AND HIGH BLOOD PRESSURE   High blood pressure causes heart disease and increases the risk of stroke. High blood pressure is more likely to develop  in:  People who have blood pressure in the high end of the normal range (130-139/85-89 mm Hg).  People who are overweight or obese.  People who are African American.  If you are 80-31 years of age, have your blood pressure checked every 3-5 years. If you are 29 years of age or older, have your blood pressure checked every year. You should have your blood pressure measured twice--once when you are at a hospital or clinic, and once when you are not at a hospital or clinic. Record the average of the two measurements. To check your blood pressure when you are not at a hospital or clinic, you can use:  An automated blood pressure machine at a pharmacy.  A home blood pressure monitor.  If you are between 81 years and 53 years old, ask your health care provider if you should take aspirin to prevent strokes.  Have regular diabetes screenings. This involves taking a blood sample to check your fasting blood sugar level.  If you are at a normal  weight and have a low risk for diabetes, have this test once every three years after 51 years of age.  If you are overweight and have a high risk for diabetes, consider being tested at a younger age or more often. PREVENTING INFECTION  Hepatitis B  If you have a higher risk for hepatitis B, you should be screened for this virus. You are considered at high risk for hepatitis B if:  You were born in a country where hepatitis B is common. Ask your health care provider which countries are considered high risk.  Your parents were born in a high-risk country, and you have not been immunized against hepatitis B (hepatitis B vaccine).  You have HIV or AIDS.  You use needles to inject street drugs.  You live with someone who has hepatitis B.  You have had sex with someone who has hepatitis B.  You get hemodialysis treatment.  You take certain medicines for conditions, including cancer, organ transplantation, and autoimmune conditions. Hepatitis C  Blood testing is recommended for:  Everyone born from [birth year] through 1966.  Anyone with known risk factors for hepatitis C. Sexually transmitted infections (STIs)  You should be screened for sexually transmitted infections (STIs) including gonorrhea and chlamydia if:  You are sexually active and are younger than 51 years of age.  You are older than 51 years of age and your health care provider tells you that you are at risk for this type of infection.  Your sexual activity has changed since you were last screened and you are at an increased risk for chlamydia or gonorrhea. Ask your health care provider if you are at risk.  If you do not have HIV, but are at risk, it may be recommended that you take a prescription medicine daily to prevent HIV infection. This is called pre-exposure prophylaxis (PrEP). You are considered at risk if:  You are sexually active and do not regularly use condoms or know the HIV status of your partner(s).  You take  drugs by injection.  You are sexually active with a partner who has HIV. Talk with your health care provider about whether you are at high risk of being infected with HIV. If you choose to begin PrEP, you should first be tested for HIV. You should then be tested every 3 months for as long as you are taking PrEP.  PREGNANCY   If you are premenopausal and you may become pregnant, ask  your health care provider about preconception counseling.  If you may become pregnant, take 400 to 800 micrograms (mcg) of folic acid every day.  If you want to prevent pregnancy, talk to your health care provider about birth control (contraception). OSTEOPOROSIS AND MENOPAUSE   Osteoporosis is a disease in which the bones lose minerals and strength with aging. This can result in serious bone fractures. Your risk for osteoporosis can be identified using a bone density scan.  If you are 35 years of age or older, or if you are at risk for osteoporosis and fractures, ask your health care provider if you should be screened.  Ask your health care provider whether you should take a calcium or vitamin D supplement to lower your risk for osteoporosis.  Menopause may have certain physical symptoms and risks.  Hormone replacement therapy may reduce some of these symptoms and risks. Talk to your health care provider about whether hormone replacement therapy is right for you.  HOME CARE INSTRUCTIONS   Schedule regular health, dental, and eye exams.  Stay current with your immunizations.   Do not use any tobacco products including cigarettes, chewing tobacco, or electronic cigarettes.  If you are pregnant, do not drink alcohol.  If you are breastfeeding, limit how much and how often you drink alcohol.  Limit alcohol intake to no more than 1 drink per day for nonpregnant women. One drink equals 12 ounces of beer, 5 ounces of wine, or 1 ounces of hard liquor.  Do not use street drugs.  Do not share  needles.  Ask your health care provider for help if you need support or information about quitting drugs.  Tell your health care provider if you often feel depressed.  Tell your health care provider if you have ever been abused or do not feel safe at home.   This information is not intended to replace advice given to you by your health care provider. Make sure you discuss any questions you have with your health care provider.   Document Released: 01/20/2011 Document Revised: 07/28/2014 Document Reviewed: 06/08/2013 Elsevier Interactive Patient Education [employer].

## 2015-12-07 NOTE — Telephone Encounter (Signed)
Can you make sure that she knows to limit use of topical steroid to 1-2 weeks, as this may cause lightening of the skin with prolonged use.  Thanks

## 2015-12-07 NOTE — Progress Notes (Signed)
Subjective:    Patient ID: Hannah Robinson, female    DOB: [birth year], 50 y.o.   MRN: [id number]  HPI  50YO female presents for physical exam.  Following with [facility] in [place] for right knee pain. Gets results of MRI today.  Generally feeling well.  Notes some red skin and itching under her breasts at times. Not using anything for this. Present off and on for months. Also notes some skin lesions on her feet. These are dark circles. They are not itching. Not painful. Not sure how long present.    Wt Readings from Last 3 Encounters:  12/07/15 245 lb (111.131 kg)  09/24/15 243 lb 4 oz (110.337 kg)  09/17/15 238 lb (107.956 kg)   BP Readings from Last 3 Encounters:  12/07/15 124/82  10/12/15 112/74  09/24/15 128/83    Past Medical History  Diagnosis Date  . Childhood asthma   . Urinary tract bacterial infections   . Diabetes mellitus     borderline  . Anemia     iron infusions in past  . Iritis     Followed by Dr. Marti Sleigh   Family History  Problem Relation Age of Onset  . Lung cancer Maternal Aunt   . Alcohol abuse Maternal Aunt   . Asthma Son   . Clotting disorder Mother   . Arthritis Mother   . Heart disease Mother   . Bipolar disorder Brother   . Stroke Maternal Grandmother   . Cancer Maternal Aunt     lung   Past Surgical History  Procedure Laterality Date  . Breast surgery  1988    benign  . Vaginal delivery      2   Social History   Social History  . Marital Status: Single    Spouse Name: N/A  . Number of Children: 2  . 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 Smoking status: Never Smoker   . Smokeless tobacco: Never Used  . Alcohol Use: 2.0 oz/week    4 drink(s) per week     Comment: Wine  . Drug Use: No  . Sexual Activity: Not Asked   Other Topics Concern  . None   Social History Narrative   Lives in [place] with sons, from [place].      Work - Runner, broadcasting/film/video   Diet -  regular   Exercise - walks with work    Review of Systems  Constitutional: Negative for fever, chills, appetite change, fatigue and unexpected weight change.  HENT: Negative for congestion, ear pain, postnasal drip, sinus pressure, sneezing, tinnitus and trouble swallowing.   Eyes: Negative for visual disturbance.  Respiratory: Negative for cough and shortness of breath.   Cardiovascular: Negative for chest pain and leg swelling.  Gastrointestinal: Negative for nausea, vomiting, abdominal pain, diarrhea and constipation.  Musculoskeletal: Negative for myalgias and arthralgias.  Skin: Positive for color change and rash.  Neurological: Negative for dizziness, weakness and headaches.  Hematological: Negative for adenopathy. Does not bruise/bleed easily.  Psychiatric/Behavioral: Negative for sleep disturbance and dysphoric mood. The patient is not nervous/anxious.        Objective:    BP 124/82 mmHg  Pulse 95  Ht  (1.676 m)  Wt 245 lb (111.131 kg)  BMI 39.56 kg/m2  SpO2 99% Physical Exam  Constitutional: She is oriented to person, place, and time. She appears well-developed and well-nourished. No distress.  HENT:  Head: Normocephalic and atraumatic.  Right Ear: External ear normal.  Left Ear: External ear normal.  Nose: Nose normal.  Mouth/Throat: Oropharynx is clear and moist. No oropharyngeal exudate.  Eyes: Conjunctivae are normal. Pupils are equal, round, and reactive to light. Right eye exhibits no discharge. Left eye exhibits no discharge. No scleral icterus.  Neck: Normal range of motion. Neck supple. No tracheal deviation present. No thyromegaly present.  Cardiovascular: Normal rate, regular rhythm, normal heart sounds and intact distal pulses.  Exam reveals no gallop and no friction rub.   No murmur heard. Pulmonary/Chest: Effort normal and breath sounds normal. No accessory muscle usage. No tachypnea. No respiratory distress. She has no decreased breath sounds. She  has no wheezes. She has no rales. She exhibits no tenderness. Right breast exhibits no inverted nipple, no mass, no nipple discharge, no skin change and no tenderness. Left breast exhibits no inverted nipple, no mass, no nipple discharge, no skin change and no tenderness. Breasts are symmetrical.  Abdominal: Soft. Bowel sounds are normal. She exhibits no distension and no mass. There is no tenderness. There is no rebound and no guarding.  Musculoskeletal: Normal range of motion. She exhibits no edema or tenderness.  Lymphadenopathy:    She has no cervical adenopathy.  Neurological: She is alert and oriented to person, place, and time. No cranial nerve deficit. She exhibits normal muscle tone. Coordination normal.  Skin: Skin is warm and dry. Rash noted. She is not diaphoretic. No erythema. No pallor.     Psychiatric: She has a normal mood and affect. Her behavior is normal. Judgment and thought content normal.          Assessment & Plan:   Problem List Items Addressed This Visit      Unprioritized   Candidal skin infection    Rash under breasts consistent with candidal skin infection. Will start topical Lotrisone given extreme itching. She will call or return to clinic if symptoms are not improving.      Relevant Medications   clotrimazole-betamethasone (LOTRISONE) cream   Right knee pain    Evaluation with MRI and orthopedics pending.      Routine general medical examination at a health care facility - Primary    General medical exam including breast exam normal today except as noted. PAP and pelvic deferred as normal in 2015, HPV neg, plan repeat in 2018. Mammogram ordered. Colonoscopy ordered. Immunizations UTD except flu which was declined. Encouraged healthy diet and exercise.      Relevant Orders   CBC w/Diff   Skin lesion    Hyperpigmented skin lesions on feet. Most consistent with scarring after insect bite or irritation by shoe. Recommended dermatology evaluation if  lesions persist.       Other Visit Diagnoses    Special screening for malignant neoplasms, colon        Relevant Orders    Ambulatory referral to Gastroenterology    Screening for breast cancer        Relevant Orders    MM Digital Screening        Return in about 6 months (around 06/08/2016) for Recheck.  Ronna PolioJennifer Walker, MD Internal Medicine [facility]

## 2015-12-07 NOTE — Addendum Note (Signed)
Addended by: Judd GaudierLEVENS, SHANNON M on: 12/07/2015 12:57 PM   Modules accepted: Medications, SmartSet

## 2015-12-07 NOTE — Assessment & Plan Note (Signed)
Evaluation with MRI and orthopedics pending.

## 2015-12-25 DIAGNOSIS — M25561 Pain in right knee: Secondary | ICD-10-CM | POA: Diagnosis not present

## 2016-01-02 ENCOUNTER — Other Ambulatory Visit: Payer: Federal, State, Local not specified - PPO

## 2016-01-10 ENCOUNTER — Other Ambulatory Visit: Payer: Federal, State, Local not specified - PPO

## 2016-02-14 DIAGNOSIS — H2013 Chronic iridocyclitis, bilateral: Secondary | ICD-10-CM | POA: Diagnosis not present

## 2016-02-14 DIAGNOSIS — H35351 Cystoid macular degeneration, right eye: Secondary | ICD-10-CM | POA: Diagnosis not present

## 2016-02-14 DIAGNOSIS — H21543 Posterior synechiae (iris), bilateral: Secondary | ICD-10-CM | POA: Diagnosis not present

## 2016-02-14 DIAGNOSIS — H2513 Age-related nuclear cataract, bilateral: Secondary | ICD-10-CM | POA: Diagnosis not present

## 2016-02-21 ENCOUNTER — Encounter: Payer: Self-pay | Admitting: *Deleted

## 2016-02-21 DIAGNOSIS — K080 Exfoliation of teeth due to systemic causes: Secondary | ICD-10-CM | POA: Diagnosis not present

## 2016-02-21 DIAGNOSIS — Z1231 Encounter for screening mammogram for malignant neoplasm of breast: Secondary | ICD-10-CM | POA: Diagnosis not present

## 2016-02-21 LAB — HM MAMMOGRAPHY

## 2016-03-21 DIAGNOSIS — H2513 Age-related nuclear cataract, bilateral: Secondary | ICD-10-CM | POA: Diagnosis not present

## 2016-03-21 DIAGNOSIS — H21543 Posterior synechiae (iris), bilateral: Secondary | ICD-10-CM | POA: Diagnosis not present

## 2016-03-21 DIAGNOSIS — H2013 Chronic iridocyclitis, bilateral: Secondary | ICD-10-CM | POA: Diagnosis not present

## 2016-03-21 DIAGNOSIS — H35351 Cystoid macular degeneration, right eye: Secondary | ICD-10-CM | POA: Diagnosis not present

## 2016-03-26 DIAGNOSIS — H40023 Open angle with borderline findings, high risk, bilateral: Secondary | ICD-10-CM | POA: Diagnosis not present

## 2016-03-26 DIAGNOSIS — H2013 Chronic iridocyclitis, bilateral: Secondary | ICD-10-CM | POA: Diagnosis not present

## 2016-04-11 DIAGNOSIS — H2013 Chronic iridocyclitis, bilateral: Secondary | ICD-10-CM | POA: Diagnosis not present

## 2016-04-11 DIAGNOSIS — H21543 Posterior synechiae (iris), bilateral: Secondary | ICD-10-CM | POA: Diagnosis not present

## 2016-04-11 DIAGNOSIS — H2513 Age-related nuclear cataract, bilateral: Secondary | ICD-10-CM | POA: Diagnosis not present

## 2016-04-11 DIAGNOSIS — H35353 Cystoid macular degeneration, bilateral: Secondary | ICD-10-CM | POA: Diagnosis not present

## 2016-05-29 DIAGNOSIS — H2013 Chronic iridocyclitis, bilateral: Secondary | ICD-10-CM | POA: Diagnosis not present

## 2016-05-29 DIAGNOSIS — H21543 Posterior synechiae (iris), bilateral: Secondary | ICD-10-CM | POA: Diagnosis not present

## 2016-05-29 DIAGNOSIS — H2513 Age-related nuclear cataract, bilateral: Secondary | ICD-10-CM | POA: Diagnosis not present

## 2016-05-29 DIAGNOSIS — H35351 Cystoid macular degeneration, right eye: Secondary | ICD-10-CM | POA: Diagnosis not present

## 2016-06-10 ENCOUNTER — Ambulatory Visit: Payer: Federal, State, Local not specified - PPO | Admitting: Internal Medicine

## 2016-06-18 ENCOUNTER — Ambulatory Visit (INDEPENDENT_AMBULATORY_CARE_PROVIDER_SITE_OTHER): Payer: Federal, State, Local not specified - PPO | Admitting: Family

## 2016-06-18 ENCOUNTER — Encounter: Payer: Self-pay | Admitting: Family

## 2016-06-18 VITALS — BP 110/80 | HR 77 | Temp 98.0°F | Wt 246.1 lb

## 2016-06-18 DIAGNOSIS — Z1211 Encounter for screening for malignant neoplasm of colon: Secondary | ICD-10-CM

## 2016-06-18 DIAGNOSIS — R300 Dysuria: Secondary | ICD-10-CM | POA: Diagnosis not present

## 2016-06-18 DIAGNOSIS — J209 Acute bronchitis, unspecified: Secondary | ICD-10-CM

## 2016-06-18 DIAGNOSIS — J45909 Unspecified asthma, uncomplicated: Secondary | ICD-10-CM

## 2016-06-18 DIAGNOSIS — J301 Allergic rhinitis due to pollen: Secondary | ICD-10-CM | POA: Diagnosis not present

## 2016-06-18 LAB — POCT URINALYSIS DIPSTICK
Bilirubin, UA: NEGATIVE
Glucose, UA: NEGATIVE
Ketones, UA: NEGATIVE
LEUKOCYTES UA: NEGATIVE
NITRITE UA: POSITIVE
PROTEIN UA: NEGATIVE
SPEC GRAV UA: 1.02
UROBILINOGEN UA: 0.2
pH, UA: 5.5

## 2016-06-18 LAB — HEMOGLOBIN A1C: HEMOGLOBIN A1C: 5.8 % (ref 4.6–6.5)

## 2016-06-18 MED ORDER — AZITHROMYCIN 250 MG PO TABS: ORAL_TABLET | ORAL | 0 refills | Status: DC

## 2016-06-18 MED ORDER — FLUTICASONE PROPIONATE 50 MCG/ACT NA SUSP: 2.0000 | Freq: Every day | NASAL | 8 refills | Status: DC

## 2016-06-18 MED ORDER — CETIRIZINE HCL 10 MG PO TABS: 10.0000 mg | ORAL_TABLET | Freq: Every day | ORAL | 11 refills | Status: DC

## 2016-06-18 NOTE — Assessment & Plan Note (Addendum)
Episode of dysuria after sexual intercourse. Symptom resolved. UA positive for nitrites.Will await urine culture prior to treatment.

## 2016-06-18 NOTE — Assessment & Plan Note (Signed)
Patient has refills of albuterol, Symbicort. Advised patient to get back on Symbicort to see if this helps cough, wheezing. Return precautions given.

## 2016-06-18 NOTE — Progress Notes (Signed)
Subjective:    Patient ID: Hannah Robinson, female    DOB: 07/10/1966, 50 y.o.   MRN: [id number]  CC: Hannah Robinson is a 50 y.o. female who presents today for follow up.   HPI: CC: cough and nasal congestion, clear runny x one week, unchanged. No fever, chills.   Asthma- stable. Has run out Symbicort. Endorses wheezing. No SOB. Seasonal allergies are trigger.   Notes urinary 'tinge' after having sex, resolved. No flank pain, frequency, vaginal itching.   Colonoscopy not scheduled. Have reordered today.        HISTORY:  Past Medical History:  Diagnosis Date  . Anemia    iron infusions in past  . Childhood asthma   . Diabetes mellitus    borderline  . Iritis    Followed by Dr. Marti Robinson  . Urinary tract bacterial infections    Past Surgical History:  Procedure Laterality Date  . BREAST SURGERY  1988   benign  . VAGINAL DELIVERY     2   Family History  Problem Relation Age of Onset  . Lung cancer Maternal Aunt   . Alcohol abuse Maternal Aunt   . Asthma Son   . Clotting disorder Mother   . Arthritis Mother   . Heart disease Mother   . Bipolar disorder Brother   . Stroke Maternal Grandmother   . Cancer Maternal Aunt     lung    Allergies: Other and Amoxicillin Current Outpatient Prescriptions on File Prior to Visit  Medication Sig Dispense Refill  . albuterol (PROAIR HFA) 108 (90 Base) MCG/ACT inhaler Inhale 2 puffs into the lungs every 4 (four) hours as needed. 8.5 Inhaler 11  . budesonide-formoterol (SYMBICORT) 80-4.5 MCG/ACT inhaler INHALE 2 PUFFS BY MOUTH 2 (TWO) TIMES DAILY. 10.2 Inhaler 11  . Cholecalciferol (VITAMIN D) 2000 units CAPS Take by mouth daily.    . clotrimazole-betamethasone (LOTRISONE) cream Apply 1 application topically 2 (two) times daily. 30 g 0  . Multiple Vitamins-Minerals (WOMENS MULTIVITAMIN PLUS PO) Take by mouth.    . PrednisoLONE Acetate (PRED FORTE OP) Apply to eye.    . [DISCONTINUED] norethindrone (MICRONOR,CAMILA,ERRIN)  0.35 MG tablet Take 1 tablet by mouth daily.     No current facility-administered medications on file prior to visit.     Social History  Substance Use Topics  . Smoking status: Never Smoker  . Smokeless tobacco: Never Used  . Alcohol use 2.0 oz/week    4 drink(s) per week     Comment: Wine    Review of Systems  Constitutional: Negative for chills and fever.  HENT: Positive for congestion and rhinorrhea. Negative for ear pain, sinus pressure and sore throat.   Respiratory: Positive for cough and wheezing.   Cardiovascular: Negative for chest pain and palpitations.  Gastrointestinal: Negative for nausea and vomiting.  Genitourinary: Positive for dysuria.      Objective:    BP 110/80 (BP Location: Left Arm, Patient Position: Sitting, Cuff Size: Large)   Pulse 77   Temp 98 F (36.7 C) (Oral)   Wt 246 lb 2 oz (111.6 kg)   LMP  (Exact Date)   SpO2 97%   BMI 39.73 kg/m  BP Readings from Last 3 Encounters:  06/18/16 110/80  12/07/15 124/82  10/12/15 112/74   Wt Readings from Last 3 Encounters:  06/18/16 246 lb 2 oz (111.6 kg)  12/07/15 245 lb (111.1 kg)  09/24/15 243 lb 4 oz (110.3 kg)    Physical Exam  Constitutional:  She appears well-developed and well-nourished.  HENT:  Head: Normocephalic and atraumatic.  Right Ear: Hearing, tympanic membrane, external ear and ear canal normal. No drainage, swelling or tenderness. No foreign bodies. Tympanic membrane is not erythematous and not bulging. No middle ear effusion. No decreased hearing is noted.  Left Ear: Hearing, tympanic membrane, external ear and ear canal normal. No drainage, swelling or tenderness. No foreign bodies. Tympanic membrane is not erythematous and not bulging.  No middle ear effusion. No decreased hearing is noted.  Nose: Rhinorrhea present. Right sinus exhibits no maxillary sinus tenderness and no frontal sinus tenderness. Left sinus exhibits no maxillary sinus tenderness and no frontal sinus tenderness.    Mouth/Throat: Uvula is midline, oropharynx is clear and moist and mucous membranes are normal. No oropharyngeal exudate, posterior oropharyngeal edema, posterior oropharyngeal erythema or tonsillar abscesses.  Eyes: Conjunctivae are normal.  Cardiovascular: Normal rate, regular rhythm, normal heart sounds and normal pulses.   Pulmonary/Chest: Effort normal and breath sounds normal. She has no wheezes. She has no rhonchi. She has no rales.  Lymphadenopathy:       Head (right side): No submental, no submandibular, no tonsillar, no preauricular, no posterior auricular and no occipital adenopathy present.       Head (left side): No submental, no submandibular, no tonsillar, no preauricular, no posterior auricular and no occipital adenopathy present.    She has no cervical adenopathy.  Neurological: She is alert.  Skin: Skin is warm and dry.  Psychiatric: She has a normal mood and affect. Her speech is normal and behavior is normal. Thought content normal.  Vitals reviewed.      Assessment & Plan:   Problem List Items Addressed This Visit      Respiratory   Asthma    Patient has refills of albuterol, Symbicort. Advised patient to get back on Symbicort to see if this helps cough, wheezing. Return precautions given.      Rhinitis, allergic    History of seasonal allergies. Advised patient to get back on Zyrtec, and trial Flonase, nasal saline.      Relevant Medications   cetirizine (ZYRTEC) 10 MG tablet   Acute bronchitis    Afebrile. Sa02 97. Working working diagnosis of viral versus allergic bronchitis. Advised patient to treat conservatively the next 1-2 days with antihistamine, Flonase. If not better, patient understands she will fill azithromycin.      Relevant Medications   azithromycin (ZITHROMAX) 250 MG tablet     Other   Dysuria    Episode of dysuria after sexual intercourse. Symptom resolved. Pending UA to ensure no infection.      Relevant Orders   POCT urinalysis  dipstick    Other Visit Diagnoses    Screen for colon cancer    -  Primary   Relevant Orders   Ambulatory referral to Gastroenterology   Hemoglobin A1c       I am having [name] start on fluticasone, azithromycin, and cetirizine. I am also having her maintain her Multiple Vitamins-Minerals (WOMENS MULTIVITAMIN PLUS PO), clotrimazole-betamethasone, budesonide-formoterol, albuterol, Vitamin D, and PrednisoLONE Acetate (PRED FORTE OP).   Meds ordered this encounter  Medications  . fluticasone (FLONASE) 50 MCG/ACT nasal spray    Sig: Place 2 sprays into both nostrils daily.    Dispense:  16 g    Refill:  8    Order Specific Question:   Supervising Provider    Answer:   Duncan Dull L [[id number]]  . azithromycin (ZITHROMAX) 250 MG tablet  Sig: Tale 500 mg PO on day 1, then 250 mg PO q24h x 4 days.    Dispense:  6 tablet    Refill:  0    Order Specific Question:   Supervising Provider    Answer:   Darrick HuntsmanULLO, TERESA L [[id number]]  . cetirizine (ZYRTEC) 10 MG tablet    Sig: Take 1 tablet (10 mg total) by mouth daily.    Dispense:  30 tablet    Refill:  11    Order Specific Question:   Supervising Provider    Answer:   Sherlene ShamsULLO, TERESA L [[id number]]    Return precautions given.   Risks, benefits, and alternatives of the medications and treatment plan prescribed today were discussed, and patient expressed understanding.   Education regarding symptom management and diagnosis given to patient on AVS.  Continue to follow with Rennie PlowmanMargaret Yussuf Sawyers, FNP for routine health maintenance.   Hannah Robinson and I agreed with plan.   Rennie PlowmanMargaret Hally Colella, FNP

## 2016-06-18 NOTE — Assessment & Plan Note (Signed)
History of seasonal allergies. Advised patient to get back on Zyrtec, and trial Flonase, nasal saline.

## 2016-06-18 NOTE — Patient Instructions (Addendum)
CPE for 11/2016. Needs pap.   Colonoscopy referral  Labs today.  Nasal saline spray, zyrtec, flonase; if not better, may start azithromycin.  Pleasure meeting you!!!

## 2016-06-18 NOTE — Assessment & Plan Note (Addendum)
Afebrile. Sa02 97. Working working diagnosis of viral versus allergic bronchitis. Advised patient to treat conservatively the next 1-2 days with antihistamine, Flonase. If not better, patient understands she will fill azithromycin.

## 2016-06-22 LAB — CULTURE, URINE COMPREHENSIVE

## 2016-06-26 ENCOUNTER — Telehealth: Payer: Self-pay | Admitting: Family

## 2016-06-26 ENCOUNTER — Other Ambulatory Visit: Payer: Self-pay

## 2016-06-26 DIAGNOSIS — B379 Candidiasis, unspecified: Secondary | ICD-10-CM

## 2016-06-26 DIAGNOSIS — T3695XA Adverse effect of unspecified systemic antibiotic, initial encounter: Principal | ICD-10-CM

## 2016-06-26 MED ORDER — FLUCONAZOLE 150 MG PO TABS: 150.0000 mg | ORAL_TABLET | Freq: Once | ORAL | 1 refills | Status: AC

## 2016-06-26 NOTE — Telephone Encounter (Signed)
Sent in rx for yeast infection.   Let me know if not better

## 2016-06-26 NOTE — Telephone Encounter (Signed)
Patient request refill on medication was getting prescribed through orthopaedic

## 2016-06-29 MED ORDER — MELOXICAM 15 MG PO TABS: 15.0000 mg | ORAL_TABLET | Freq: Every day | ORAL | 0 refills | Status: DC

## 2016-06-29 NOTE — Telephone Encounter (Signed)
Refilled medication however pt needs to be on OTC prilosec to protect stomach from GI upset and/or bleed/ ulcer.   Please ensure she takes mobic with FOOD.

## 2016-06-30 MED ORDER — FLUCONAZOLE 150 MG PO TABS: 150.0000 mg | ORAL_TABLET | Freq: Once | ORAL | 1 refills | Status: AC

## 2016-06-30 NOTE — Telephone Encounter (Signed)
Ordered again to [facility]. Tried calling as well.  Please call pt and ask which pharm she would like Please confirm phone number.

## 2016-06-30 NOTE — Telephone Encounter (Signed)
Patient advised and verbalized an understanding  

## 2016-06-30 NOTE — Telephone Encounter (Signed)
Patient states she went to get script for yeast and it wasn't at pharmacy please advise. Thanks.

## 2016-07-01 NOTE — Telephone Encounter (Signed)
Spoke with patient and confirmed . She did receive script for fluconazole  She was questioning fluticasone script she will call pharmacy and confirm with them today.

## 2016-07-16 ENCOUNTER — Other Ambulatory Visit (INDEPENDENT_AMBULATORY_CARE_PROVIDER_SITE_OTHER): Payer: Federal, State, Local not specified - PPO

## 2016-07-16 ENCOUNTER — Telehealth: Payer: Self-pay | Admitting: Family

## 2016-07-16 ENCOUNTER — Other Ambulatory Visit: Payer: Self-pay | Admitting: Family

## 2016-07-16 DIAGNOSIS — R300 Dysuria: Secondary | ICD-10-CM

## 2016-07-16 LAB — POCT URINALYSIS DIPSTICK
Bilirubin, UA: NEGATIVE
Glucose, UA: NEGATIVE
KETONES UA: NEGATIVE
Nitrite, UA: NEGATIVE
PH UA: 7
SPEC GRAV UA: 1.02
Urobilinogen, UA: 1

## 2016-07-16 NOTE — Telephone Encounter (Signed)
Pt c/o frequent urination and slight burning. Pt would like to have Cipro called in. Please advise, thank you!  Call pt @ [phone]

## 2016-07-16 NOTE — Telephone Encounter (Signed)
Last seen on 06/18/16 , she wants to come in and leave urine specimen , states she is having dysuria upon emptying bladder, low back pain, frequent urination.  Please advise.  Declines to schedule appointment as urged to due so.   Symptoms present since last appointment . Please advise.

## 2016-07-16 NOTE — Telephone Encounter (Signed)
Left message to call.

## 2016-07-16 NOTE — Telephone Encounter (Signed)
Call pt-  If symptoms present for a month, even more concerning as she did not have an infection at that time. She also asked for medication for yeast infection last time. She needs pelvic exam.   She did not have infection last time so she needs to be evaluated.   Treating her symptoms without a visit  is NOT safe medicine. She also thought she had a yeast infection, ALL of these things need an exam.   Sorry- if she is adamants about not being seen. I will for this ONE time allow her to drop off urine. UTIs require A VISIT.

## 2016-07-16 NOTE — Telephone Encounter (Signed)
Patient advised of below and verbalized an understanding she will come today and leave urine specimen.

## 2016-07-19 LAB — CULTURE, URINE COMPREHENSIVE

## 2016-07-22 ENCOUNTER — Telehealth: Payer: Self-pay | Admitting: Family

## 2016-07-22 ENCOUNTER — Encounter: Payer: Self-pay | Admitting: Family

## 2016-07-22 DIAGNOSIS — N3000 Acute cystitis without hematuria: Secondary | ICD-10-CM

## 2016-07-22 MED ORDER — CIPROFLOXACIN HCL 250 MG PO TABS: 250.0000 mg | ORAL_TABLET | Freq: Two times a day (BID) | ORAL | 0 refills | Status: DC

## 2016-07-22 NOTE — Telephone Encounter (Signed)
PCN allergic Patient preference cipro No sensitivity report on culture

## 2016-07-29 DIAGNOSIS — Z79899 Other long term (current) drug therapy: Secondary | ICD-10-CM | POA: Insufficient documentation

## 2016-07-30 DIAGNOSIS — H209 Unspecified iridocyclitis: Secondary | ICD-10-CM | POA: Diagnosis not present

## 2016-07-30 DIAGNOSIS — Z79899 Other long term (current) drug therapy: Secondary | ICD-10-CM | POA: Diagnosis not present

## 2016-07-30 DIAGNOSIS — H2000 Unspecified acute and subacute iridocyclitis: Secondary | ICD-10-CM | POA: Diagnosis not present

## 2016-07-31 DIAGNOSIS — H35351 Cystoid macular degeneration, right eye: Secondary | ICD-10-CM | POA: Diagnosis not present

## 2016-07-31 DIAGNOSIS — H21543 Posterior synechiae (iris), bilateral: Secondary | ICD-10-CM | POA: Diagnosis not present

## 2016-07-31 DIAGNOSIS — H2013 Chronic iridocyclitis, bilateral: Secondary | ICD-10-CM | POA: Diagnosis not present

## 2016-07-31 DIAGNOSIS — H2513 Age-related nuclear cataract, bilateral: Secondary | ICD-10-CM | POA: Diagnosis not present

## 2016-08-21 DIAGNOSIS — Z91018 Allergy to other foods: Secondary | ICD-10-CM | POA: Diagnosis not present

## 2016-08-21 DIAGNOSIS — Z23 Encounter for immunization: Secondary | ICD-10-CM | POA: Diagnosis not present

## 2016-08-21 DIAGNOSIS — K080 Exfoliation of teeth due to systemic causes: Secondary | ICD-10-CM | POA: Diagnosis not present

## 2016-08-21 DIAGNOSIS — J4540 Moderate persistent asthma, uncomplicated: Secondary | ICD-10-CM | POA: Diagnosis not present

## 2016-09-10 DIAGNOSIS — H2513 Age-related nuclear cataract, bilateral: Secondary | ICD-10-CM | POA: Diagnosis not present

## 2016-09-10 DIAGNOSIS — H21543 Posterior synechiae (iris), bilateral: Secondary | ICD-10-CM | POA: Diagnosis not present

## 2016-09-10 DIAGNOSIS — H2013 Chronic iridocyclitis, bilateral: Secondary | ICD-10-CM | POA: Diagnosis not present

## 2016-09-18 DIAGNOSIS — H21543 Posterior synechiae (iris), bilateral: Secondary | ICD-10-CM | POA: Diagnosis not present

## 2016-09-18 DIAGNOSIS — H2013 Chronic iridocyclitis, bilateral: Secondary | ICD-10-CM | POA: Diagnosis not present

## 2016-09-18 DIAGNOSIS — H35351 Cystoid macular degeneration, right eye: Secondary | ICD-10-CM | POA: Diagnosis not present

## 2016-09-18 DIAGNOSIS — H2513 Age-related nuclear cataract, bilateral: Secondary | ICD-10-CM | POA: Diagnosis not present

## 2016-09-30 DIAGNOSIS — J301 Allergic rhinitis due to pollen: Secondary | ICD-10-CM | POA: Diagnosis not present

## 2016-09-30 DIAGNOSIS — Z91018 Allergy to other foods: Secondary | ICD-10-CM | POA: Diagnosis not present

## 2016-09-30 DIAGNOSIS — J4540 Moderate persistent asthma, uncomplicated: Secondary | ICD-10-CM | POA: Diagnosis not present

## 2016-10-13 DIAGNOSIS — K080 Exfoliation of teeth due to systemic causes: Secondary | ICD-10-CM | POA: Diagnosis not present

## 2016-10-17 DIAGNOSIS — H21542 Posterior synechiae (iris), left eye: Secondary | ICD-10-CM | POA: Diagnosis not present

## 2016-10-17 DIAGNOSIS — H209 Unspecified iridocyclitis: Secondary | ICD-10-CM | POA: Diagnosis not present

## 2016-10-17 DIAGNOSIS — H2512 Age-related nuclear cataract, left eye: Secondary | ICD-10-CM | POA: Diagnosis not present

## 2016-10-17 DIAGNOSIS — H5703 Miosis: Secondary | ICD-10-CM | POA: Diagnosis not present

## 2016-10-30 DIAGNOSIS — H2013 Chronic iridocyclitis, bilateral: Secondary | ICD-10-CM | POA: Diagnosis not present

## 2016-10-30 DIAGNOSIS — H40023 Open angle with borderline findings, high risk, bilateral: Secondary | ICD-10-CM | POA: Diagnosis not present

## 2016-10-30 DIAGNOSIS — Z9842 Cataract extraction status, left eye: Secondary | ICD-10-CM | POA: Diagnosis not present

## 2016-10-30 DIAGNOSIS — H35353 Cystoid macular degeneration, bilateral: Secondary | ICD-10-CM | POA: Diagnosis not present

## 2016-11-05 DIAGNOSIS — K080 Exfoliation of teeth due to systemic causes: Secondary | ICD-10-CM | POA: Diagnosis not present

## 2016-11-07 DIAGNOSIS — K080 Exfoliation of teeth due to systemic causes: Secondary | ICD-10-CM | POA: Diagnosis not present

## 2016-12-10 ENCOUNTER — Encounter: Payer: Federal, State, Local not specified - PPO | Admitting: Family

## 2016-12-11 ENCOUNTER — Encounter: Payer: Self-pay | Admitting: Family

## 2016-12-11 ENCOUNTER — Other Ambulatory Visit (HOSPITAL_COMMUNITY)
Admission: RE | Admit: 2016-12-11 | Discharge: 2016-12-11 | Disposition: A | Discharge status: Home / Self Care | Payer: Federal, State, Local not specified - PPO | Source: Ambulatory Visit | Attending: Family | Admitting: Family

## 2016-12-11 ENCOUNTER — Ambulatory Visit (INDEPENDENT_AMBULATORY_CARE_PROVIDER_SITE_OTHER): Payer: Federal, State, Local not specified - PPO | Admitting: Family

## 2016-12-11 VITALS — BP 104/68 | HR 75 | Temp 98.2°F | Resp 17 | Ht 66.0 in | Wt 249.4 lb

## 2016-12-11 DIAGNOSIS — Z0001 Encounter for general adult medical examination with abnormal findings: Secondary | ICD-10-CM

## 2016-12-11 DIAGNOSIS — H35353 Cystoid macular degeneration, bilateral: Secondary | ICD-10-CM | POA: Diagnosis not present

## 2016-12-11 DIAGNOSIS — R1013 Epigastric pain: Secondary | ICD-10-CM | POA: Diagnosis not present

## 2016-12-11 DIAGNOSIS — H2013 Chronic iridocyclitis, bilateral: Secondary | ICD-10-CM | POA: Diagnosis not present

## 2016-12-11 DIAGNOSIS — H21543 Posterior synechiae (iris), bilateral: Secondary | ICD-10-CM | POA: Diagnosis not present

## 2016-12-11 DIAGNOSIS — Z961 Presence of intraocular lens: Secondary | ICD-10-CM | POA: Diagnosis not present

## 2016-12-11 DIAGNOSIS — Z0000 Encounter for general adult medical examination without abnormal findings: Secondary | ICD-10-CM

## 2016-12-11 LAB — HM PAP SMEAR: HM PAP: NEGATIVE

## 2016-12-11 MED ORDER — OMEPRAZOLE 20 MG PO CPDR: 20.0000 mg | DELAYED_RELEASE_CAPSULE | Freq: Every day | ORAL | 3 refills | Status: DC

## 2016-12-11 NOTE — Assessment & Plan Note (Signed)
Again have place order for colonoscopy. Order mammogram. Pap, cbe  performed today. Screening  Labs ordered. Encouraged exercise. Referral to podiatry for callus removal.

## 2016-12-11 NOTE — Assessment & Plan Note (Addendum)
Reassured by normal EKG. sinus rhythm. No acute ischemia. When compared to prior EKG 2006, no significant changes. Pain is nonspecific and reassured the patient is also not having the pain today during her visit. Pain is intermittent and no identified triggers. Discussed trial of omeprazole in the context of her history of acid reflux. Advised patient to remain hypervigilant, notify me of any new symptoms. Follow-up in 2 months

## 2016-12-11 NOTE — Patient Instructions (Addendum)
Labs when fasting  Trial of prilosec.   Colonscopy referall  We placed a referral. Mammogram this year. I asked that you call one the below locations and schedule this when it is convenient for you.   If you have dense breasts, you may ask for 3D mammogram over the traditional 2D mammogram as new evidence suggest 3D is superior. Please note that NOT all insurance companies cover 3D and you may have to pay a higher copay. You may call your insurance company to further clarify your benefits.   Options for [place]  [place], [place]  * [facility] [place] [place] * Note if you ask for 3D mammogram at this location, you must request [place], [place] location*      Health Maintenance, Female Adopting a healthy lifestyle and getting preventive care can go a long way to promote health and wellness. Talk with your health care provider about what schedule of regular examinations is right for you. This is a good chance for you to check in with your provider about disease prevention and staying healthy. In between checkups, there are plenty of things you can do on your own. Experts have done a lot of research about which lifestyle changes and preventive measures are most likely to keep you healthy. Ask your health care provider for more information. Weight and diet Eat a healthy diet  Be sure to include plenty of vegetables, fruits, low-fat dairy products, and lean protein.  Do not eat a lot of foods high in solid fats, added sugars, or salt.  Get regular exercise. This is one of the most important things you can do for your health.  Most adults should exercise for at least 150 minutes each week. The exercise should increase your heart rate and make you sweat (moderate-intensity exercise).  Most adults should also do strengthening  exercises at least twice a week. This is in addition to the moderate-intensity exercise. Maintain a healthy weight  Body mass index (BMI) is a measurement that can be used to identify possible weight problems. It estimates body fat based on height and weight. Your health care provider can help determine your BMI and help you achieve or maintain a healthy weight.  For females 75 years of age and older:  A BMI below 18.5 is considered underweight.  A BMI of 18.5 to 24.9 is normal.  A BMI of 25 to 29.9 is considered overweight.  A BMI of 30 and above is considered obese. Watch levels of cholesterol and blood lipids  You should start having your blood tested for lipids and cholesterol at 52 years of age, then have this test every 5 years.  You may need to have your cholesterol levels checked more often if:  Your lipid or cholesterol levels are high.  You are older than 52 years of age.  You are at high risk for heart disease. Cancer screening Lung Cancer  Lung cancer screening is recommended for adults 45-10 years old who are at high risk for lung cancer because of a history of smoking.  A yearly low-dose CT scan of the lungs is recommended for people who:  Currently smoke.  Have quit within the past 15 years.  Have at least a 30-pack-year history of smoking. A pack year is smoking an average of one pack of cigarettes a day for 1  year.  Yearly screening should continue until it has been 15 years since you quit.  Yearly screening should stop if you develop a health problem that would prevent you from having lung cancer treatment. Breast Cancer  Practice breast self-awareness. This means understanding how your breasts normally appear and feel.  It also means doing regular breast self-exams. Let your health care provider know about any changes, no matter how small.  If you are in your 20s or 30s, you should have a clinical breast exam (CBE) by a health care provider every 1-3  years as part of a regular health exam.  If you are 66 or older, have a CBE every year. Also consider having a breast X-ray (mammogram) every year.  If you have a family history of breast cancer, talk to your health care provider about genetic screening.  If you are at high risk for breast cancer, talk to your health care provider about having an MRI and a mammogram every year.  Breast cancer gene (BRCA) assessment is recommended for women who have family members with BRCA-related cancers. BRCA-related cancers include:  Breast.  Ovarian.  Tubal.  Peritoneal cancers.  Results of the assessment will determine the need for genetic counseling and BRCA1 and BRCA2 testing. Cervical Cancer  Your health care provider may recommend that you be screened regularly for cancer of the pelvic organs (ovaries, uterus, and vagina). This screening involves a pelvic examination, including checking for microscopic changes to the surface of your cervix (Pap test). You may be encouraged to have this screening done every 3 years, beginning at age 8.  For women ages 21-65, health care providers may recommend pelvic exams and Pap testing every 3 years, or they may recommend the Pap and pelvic exam, combined with testing for human papilloma virus (HPV), every 5 years. Some types of HPV increase your risk of cervical cancer. Testing for HPV may also be done on women of any age with unclear Pap test results.  Other health care providers may not recommend any screening for nonpregnant women who are considered low risk for pelvic cancer and who do not have symptoms. Ask your health care provider if a screening pelvic exam is right for you.  If you have had past treatment for cervical cancer or a condition that could lead to cancer, you need Pap tests and screening for cancer for at least 20 years after your treatment. If Pap tests have been discontinued, your risk factors (such as having a new sexual partner) need to  be reassessed to determine if screening should resume. Some women have medical problems that increase the chance of getting cervical cancer. In these cases, your health care provider may recommend more frequent screening and Pap tests. Colorectal Cancer  This type of cancer can be detected and often prevented.  Routine colorectal cancer screening usually begins at 52 years of age and continues through 52 years of age.  Your health care provider may recommend screening at an earlier age if you have risk factors for colon cancer.  Your health care provider may also recommend using home test kits to check for hidden blood in the stool.  A small camera at the end of a tube can be used to examine your colon directly (sigmoidoscopy or colonoscopy). This is done to check for the earliest forms of colorectal cancer.  Routine screening usually begins at age 29.  Direct examination of the colon should be repeated every 5-10 years through 52 years of age.  However, you may need to be screened more often if early forms of precancerous polyps or small growths are found. Skin Cancer  Check your skin from head to toe regularly.  Tell your health care provider about any new moles or changes in moles, especially if there is a change in a mole's shape or color.  Also tell your health care provider if you have a mole that is larger than the size of a pencil eraser.  Always use sunscreen. Apply sunscreen liberally and repeatedly throughout the day.  Protect yourself by wearing long sleeves, pants, a wide-brimmed hat, and sunglasses whenever you are outside. Heart disease, diabetes, and high blood pressure  High blood pressure causes heart disease and increases the risk of stroke. High blood pressure is more likely to develop in:  People who have blood pressure in the high end of the normal range (130-139/85-89 mm Hg).  People who are overweight or obese.  People who are African American.  If you are  77-50 years of age, have your blood pressure checked every 3-5 years. If you are 42 years of age or older, have your blood pressure checked every year. You should have your blood pressure measured twice-once when you are at a hospital or clinic, and once when you are not at a hospital or clinic. Record the average of the two measurements. To check your blood pressure when you are not at a hospital or clinic, you can use:  An automated blood pressure machine at a pharmacy.  A home blood pressure monitor.  If you are between 18 years and 65 years old, ask your health care provider if you should take aspirin to prevent strokes.  Have regular diabetes screenings. This involves taking a blood sample to check your fasting blood sugar level.  If you are at a normal weight and have a low risk for diabetes, have this test once every three years after 52 years of age.  If you are overweight and have a high risk for diabetes, consider being tested at a younger age or more often. Preventing infection Hepatitis B  If you have a higher risk for hepatitis B, you should be screened for this virus. You are considered at high risk for hepatitis B if:  You were born in a country where hepatitis B is common. Ask your health care provider which countries are considered high risk.  Your parents were born in a high-risk country, and you have not been immunized against hepatitis B (hepatitis B vaccine).  You have HIV or AIDS.  You use needles to inject street drugs.  You live with someone who has hepatitis B.  You have had sex with someone who has hepatitis B.  You get hemodialysis treatment.  You take certain medicines for conditions, including cancer, organ transplantation, and autoimmune conditions. Hepatitis C  Blood testing is recommended for:  Everyone born from [birth year] through 1966.  Anyone with known risk factors for hepatitis C. Sexually transmitted infections (STIs)  You should be screened  for sexually transmitted infections (STIs) including gonorrhea and chlamydia if:  You are sexually active and are younger than 52 years of age.  You are older than 52 years of age and your health care provider tells you that you are at risk for this type of infection.  Your sexual activity has changed since you were last screened and you are at an increased risk for chlamydia or gonorrhea. Ask your health care provider if you are at risk.  If you do not have HIV, but are at risk, it may be recommended that you take a prescription medicine daily to prevent HIV infection. This is called pre-exposure prophylaxis (PrEP). You are considered at risk if:  You are sexually active and do not regularly use condoms or know the HIV status of your partner(s).  You take drugs by injection.  You are sexually active with a partner who has HIV. Talk with your health care provider about whether you are at high risk of being infected with HIV. If you choose to begin PrEP, you should first be tested for HIV. You should then be tested every 3 months for as long as you are taking PrEP. Pregnancy  If you are premenopausal and you may become pregnant, ask your health care provider about preconception counseling.  If you may become pregnant, take 400 to 800 micrograms (mcg) of folic acid every day.  If you want to prevent pregnancy, talk to your health care provider about birth control (contraception). Osteoporosis and menopause  Osteoporosis is a disease in which the bones lose minerals and strength with aging. This can result in serious bone fractures. Your risk for osteoporosis can be identified using a bone density scan.  If you are 32 years of age or older, or if you are at risk for osteoporosis and fractures, ask your health care provider if you should be screened.  Ask your health care provider whether you should take a calcium or vitamin D supplement to lower your risk for osteoporosis.  Menopause may  have certain physical symptoms and risks.  Hormone replacement therapy may reduce some of these symptoms and risks. Talk to your health care provider about whether hormone replacement therapy is right for you. Follow these instructions at home:  Schedule regular health, dental, and eye exams.  Stay current with your immunizations.  Do not use any tobacco products including cigarettes, chewing tobacco, or electronic cigarettes.  If you are pregnant, do not drink alcohol.  If you are breastfeeding, limit how much and how often you drink alcohol.  Limit alcohol intake to no more than 1 drink per day for nonpregnant women. One drink equals 12 ounces of beer, 5 ounces of wine, or 1 ounces of hard liquor.  Do not use street drugs.  Do not share needles.  Ask your health care provider for help if you need support or information about quitting drugs.  Tell your health care provider if you often feel depressed.  Tell your health care provider if you have ever been abused or do not feel safe at home. This information is not intended to replace advice given to you by your health care provider. Make sure you discuss any questions you have with your health care provider. Document Released: 01/20/2011 Document Revised: 12/13/2015 Document Reviewed: 04/10/2015 Elsevier Interactive Patient Education  2017 [employer].

## 2016-12-11 NOTE — Progress Notes (Signed)
Subjective:    Patient ID: Hannah Robinson, female    DOB: 09/16/1965, 51 y.o.   MRN: [id number]  CC: Hannah Robinson is a 51 y.o. female who presents today for physical exam.    HPI: Epigastric pain. Intermittent. Over past couple of weeks. Unchanged. Acid reflux in the past.  Lasts a couple of minutes and then goes. Hasn't tried any medication. 'Wanders if anxiety'. Doesn't occur with exercise or food.   Denies  numbness or tingling radiating to left arm or jaw, palpitations, dizziness, frequent headaches, changes in vision, or shortness of breath during episode.  Would like referral to podiatry for left second toe callus removal.      Colorectal Cancer Screening: Ordered 05/2016;  Still not done; reemphasized importance to patient of scheduling.  Breast Cancer Screening: Mammogram UTD, due 02/2017 Cervical Cancer Screening: due Bone Health screening/DEXA for 65+: No increased fracture risk. Defer screening at this time. Lung Cancer Screening: Doesn't have 30 year pack year history and age > 55 years.       Tetanus - utd         Labs: Screening labs today. Exercise: Gets regular exercise.  Alcohol use: occasional Smoking/tobacco use: Nonsmoker.  Regular dental exams: UTD Wears seat belt: Yes.   HISTORY:  Past Medical History:  Diagnosis Date  . Anemia    iron infusions in past  . Childhood asthma   . Diabetes mellitus    borderline  . Iritis    Followed by Dr. Marti Sleigh  . Urinary tract bacterial infections     Past Surgical History:  Procedure Laterality Date  . BREAST SURGERY  1988   benign  . VAGINAL DELIVERY     2   Family History  Problem Relation Age of Onset  . Lung cancer Maternal Aunt   . Alcohol abuse Maternal Aunt   . Asthma Son   . Clotting disorder Mother   . Arthritis Mother   . Heart disease Mother   . Bipolar disorder Brother   . Stroke Maternal Grandmother   . Cancer Maternal Aunt        lung      ALLERGIES: Other and  Amoxicillin  Current Outpatient Prescriptions on File Prior to Visit  Medication Sig Dispense Refill  . albuterol (PROAIR HFA) 108 (90 Base) MCG/ACT inhaler Inhale 2 puffs into the lungs every 4 (four) hours as needed. 8.5 Inhaler 11  . fluticasone (FLONASE) 50 MCG/ACT nasal spray Place 2 sprays into both nostrils daily. 16 g 8  . meloxicam (MOBIC) 15 MG tablet Take 1 tablet (15 mg total) by mouth daily. 90 tablet 0  . cetirizine (ZYRTEC) 10 MG tablet Take 1 tablet (10 mg total) by mouth daily. (Patient not taking: Reported on 12/11/2016) 30 tablet 11  . [DISCONTINUED] norethindrone (MICRONOR,CAMILA,ERRIN) 0.35 MG tablet Take 1 tablet by mouth daily.     No current facility-administered medications on file prior to visit.     Social History  Substance Use Topics  . Smoking status: Never Smoker  . Smokeless tobacco: Never Used  . Alcohol use 2.0 oz/week    4 drink(s) per week     Comment: Wine    Review of Systems  Constitutional: Negative for chills, fever and unexpected weight change.  HENT: Negative for congestion.   Respiratory: Negative for cough and shortness of breath.   Cardiovascular: Positive for chest pain. Negative for palpitations and leg swelling.  Gastrointestinal: Negative for abdominal pain, nausea and vomiting.  Musculoskeletal: Negative for arthralgias and myalgias.  Skin: Negative for rash.  Neurological: Negative for headaches.  Hematological: Negative for adenopathy.  Psychiatric/Behavioral: Negative for confusion.      Objective:    BP 104/68 (BP Location: Left Arm, Patient Position: Sitting, Cuff Size: Large)   Pulse 75   Temp 98.2 F (36.8 C) (Oral)   Resp 17   Ht 5\' 6"  (1.676 m)   Wt 249 lb 6.4 oz (113.1 kg)   SpO2 99%   BMI 40.25 kg/m   BP Readings from Last 3 Encounters:  12/11/16 104/68  06/18/16 110/80  12/07/15 124/82   Wt Readings from Last 3 Encounters:  12/11/16 249 lb 6.4 oz (113.1 kg)  06/18/16 246 lb 2 oz (111.6 kg)  12/07/15  245 lb (111.1 kg)    Physical Exam  Constitutional: She appears well-developed and well-nourished.  Eyes: Conjunctivae are normal.  Neck: No thyroid mass and no thyromegaly present.  Cardiovascular: Normal rate, regular rhythm, normal heart sounds and normal pulses.   Pulmonary/Chest: Effort normal and breath sounds normal. She has no wheezes. She has no rhonchi. She has no rales. Right breast exhibits no inverted nipple, no mass, no nipple discharge, no skin change and no tenderness. Left breast exhibits no inverted nipple, no mass, no nipple discharge, no skin change and no tenderness. Breasts are symmetrical.  No masses or asymmetry appreciated during CBE.  Genitourinary: Uterus is not enlarged, not fixed and not tender. Cervix exhibits no motion tenderness, no discharge and no friability. Right adnexum displays no mass, no tenderness and no fullness. Left adnexum displays no mass, no tenderness and no fullness.  Genitourinary Comments: Pap performed. No CMT. Unable to appreciated ovaries.  Lymphadenopathy:       Head (right side): No submental, no submandibular, no tonsillar, no preauricular, no posterior auricular and no occipital adenopathy present.       Head (left side): No submental, no submandibular, no tonsillar, no preauricular, no posterior auricular and no occipital adenopathy present.       Right cervical: No superficial cervical, no deep cervical and no posterior cervical adenopathy present.      Left cervical: No superficial cervical, no deep cervical and no posterior cervical adenopathy present.    She has no axillary adenopathy.       Right axillary: No pectoral and no lateral adenopathy present.       Left axillary: No pectoral and no lateral adenopathy present. Neurological: She is alert.  Skin: Skin is warm and dry.  Psychiatric: She has a normal mood and affect. Her speech is normal and behavior is normal. Thought content normal.  Vitals reviewed.      Assessment &  Plan:   Problem List Items Addressed This Visit      Other   Routine general medical examination at a health care facility - Primary    Again have place order for colonoscopy. Order mammogram. Pap, cbe  performed today. Screening  Labs ordered. Encouraged exercise. Referral to podiatry for callus removal.       Relevant Orders   Ambulatory referral to Podiatry   CBC with Differential/Platelet   Comprehensive metabolic panel   Hemoglobin A1c   Lipid panel   TSH   VITAMIN D 25 Hydroxy (Vit-D Deficiency, Fractures)   MM SCREENING BREAST TOMO BILATERAL   Ferritin   IBC panel   B12 and Folate Panel   Ambulatory referral to Gastroenterology   Epigastric pain    Reassured by normal EKG.  Pain is nonspecific and reassured the patient is also not having the pain today during her visit. Pain is intermittent and no identified triggers. Discussed trial of omeprazole in the context of her history of acid reflux. Advised patient to remain hypervigilant, notify me of any new symptoms. Follow-up in 2 months      Relevant Orders   EKG 12-Lead   Cytology - PAP       I have discontinued Ms. Pam's Multiple Vitamins-Minerals (WOMENS MULTIVITAMIN PLUS PO), clotrimazole-betamethasone, budesonide-formoterol, Vitamin D, PrednisoLONE Acetate (PRED FORTE OP), azithromycin, and ciprofloxacin. I am also having her maintain her albuterol, fluticasone, cetirizine, meloxicam, brimonidine, DUREZOL, ketorolac, and fluticasone furoate-vilanterol.   Meds ordered this encounter  Medications  . brimonidine (ALPHAGAN) 0.2 % ophthalmic solution    Sig: Apply to eye.  . DUREZOL 0.05 % EMUL    Sig: PLACE 1 DROP INTO THE RIGHT EYE ONCE DAILY.    Refill:  6  . ketorolac (ACULAR) 0.5 % ophthalmic solution    Sig: PLACE 1 DROP INTO THE RIGHT EYE 4 (FOUR) TIMES DAILY.    Refill:  2  . fluticasone furoate-vilanterol (BREO ELLIPTA) 100-25 MCG/INH AEPB    Sig: Inhale 1 puff into the lungs daily.    Return  precautions given.   Risks, benefits, and alternatives of the medications and treatment plan prescribed today were discussed, and patient expressed understanding.   Education regarding symptom management and diagnosis given to patient on AVS.   Continue to follow with Allegra GranaArnett, Margaret G, FNP for routine health maintenance.   Beverlyn RouxYvette C Granados and I agreed with plan.   Rennie PlowmanMargaret Arnett, FNP

## 2016-12-12 ENCOUNTER — Other Ambulatory Visit: Payer: Self-pay

## 2016-12-12 ENCOUNTER — Telehealth: Payer: Self-pay

## 2016-12-12 DIAGNOSIS — Z1211 Encounter for screening for malignant neoplasm of colon: Secondary | ICD-10-CM

## 2016-12-12 NOTE — Telephone Encounter (Signed)
Gastroenterology Pre-Procedure Review  Request Date: 12/29/16 Requesting Physician: Dr. Servando Snarewohl  PATIENT REVIEW QUESTIONS: The patient responded to the following health history questions as indicated:    1. Are you having any GI issues? yes (reflux) 2. Do you have a personal history of Polyps? no 3. Do you have a family history of Colon Cancer or Polyps? no 4. Diabetes Mellitus? no 5. Joint replacements in the past 12 months?10/17/16 cataract surgery 6. Major health problems in the past 3 months?no 7. Any artificial heart valves, MVP, or defibrillator?no    MEDICATIONS & ALLERGIES:    Patient reports the following regarding taking any anticoagulation/antiplatelet therapy:   Plavix, Coumadin, Eliquis, Xarelto, Lovenox, Pradaxa, Brilinta, or Effient? no Aspirin? no  Patient confirms/reports the following medications:  Current Outpatient Prescriptions  Medication Sig Dispense Refill  . albuterol (PROAIR HFA) 108 (90 Base) MCG/ACT inhaler Inhale 2 puffs into the lungs every 4 (four) hours as needed. 8.5 Inhaler 11  . brimonidine (ALPHAGAN) 0.2 % ophthalmic solution Apply to eye.    . cetirizine (ZYRTEC) 10 MG tablet Take 1 tablet (10 mg total) by mouth daily. (Patient not taking: Reported on 12/11/2016) 30 tablet 11  . DUREZOL 0.05 % EMUL PLACE 1 DROP INTO THE RIGHT EYE ONCE DAILY.  6  . fluticasone (FLONASE) 50 MCG/ACT nasal spray Place 2 sprays into both nostrils daily. 16 g 8  . fluticasone furoate-vilanterol (BREO ELLIPTA) 100-25 MCG/INH AEPB Inhale 1 puff into the lungs daily.    Marland Kitchen. ketorolac (ACULAR) 0.5 % ophthalmic solution PLACE 1 DROP INTO THE RIGHT EYE 4 (FOUR) TIMES DAILY.  2  . meloxicam (MOBIC) 15 MG tablet Take 1 tablet (15 mg total) by mouth daily. 90 tablet 0  . omeprazole (PRILOSEC) 20 MG capsule Take 1 capsule (20 mg total) by mouth daily. 30 capsule 3   No current facility-administered medications for this visit.     Patient confirms/reports the following allergies:   Allergies  Allergen Reactions  . Other Hives    Allergic to tree nuts, but not to peanuts  . Amoxicillin     rash    No orders of the defined types were placed in this encounter.   AUTHORIZATION INFORMATION Primary Insurance: 1D#: Group #:  Secondary Insurance: 1D#: Group #:  SCHEDULE INFORMATION: Date: 12/29/16 Time: Location:[facility]

## 2016-12-16 LAB — CYTOLOGY - PAP
Diagnosis: NEGATIVE
HPV (WINDOPATH): NOT DETECTED

## 2016-12-29 ENCOUNTER — Other Ambulatory Visit: Payer: Federal, State, Local not specified - PPO

## 2017-01-01 ENCOUNTER — Ambulatory Visit: Payer: Self-pay | Admitting: Podiatry

## 2017-01-06 ENCOUNTER — Other Ambulatory Visit (INDEPENDENT_AMBULATORY_CARE_PROVIDER_SITE_OTHER): Payer: Federal, State, Local not specified - PPO

## 2017-01-06 DIAGNOSIS — Z0000 Encounter for general adult medical examination without abnormal findings: Secondary | ICD-10-CM

## 2017-01-06 DIAGNOSIS — K080 Exfoliation of teeth due to systemic causes: Secondary | ICD-10-CM | POA: Diagnosis not present

## 2017-01-06 LAB — CBC WITH DIFFERENTIAL/PLATELET
Basophils Absolute: 0 10*3/uL (ref 0.0–0.1)
Basophils Relative: 0.7 % (ref 0.0–3.0)
EOS ABS: 0.2 10*3/uL (ref 0.0–0.7)
EOS PCT: 4.9 % (ref 0.0–5.0)
HCT: 38.2 % (ref 36.0–46.0)
HEMOGLOBIN: 12.4 g/dL (ref 12.0–15.0)
LYMPHS PCT: 43.4 % (ref 12.0–46.0)
Lymphs Abs: 1.5 10*3/uL (ref 0.7–4.0)
MCHC: 32.5 g/dL (ref 30.0–36.0)
MCV: 81.5 fl (ref 78.0–100.0)
MONOS PCT: 11.2 % (ref 3.0–12.0)
Monocytes Absolute: 0.4 10*3/uL (ref 0.1–1.0)
NEUTROS ABS: 1.4 10*3/uL (ref 1.4–7.7)
Neutrophils Relative %: 39.8 % — ABNORMAL LOW (ref 43.0–77.0)
PLATELETS: 243 10*3/uL (ref 150.0–400.0)
RBC: 4.69 Mil/uL (ref 3.87–5.11)
RDW: 15.6 % — AB (ref 11.5–15.5)
WBC: 3.6 10*3/uL — AB (ref 4.0–10.5)

## 2017-01-06 LAB — COMPREHENSIVE METABOLIC PANEL
ALBUMIN: 3.9 g/dL (ref 3.5–5.2)
ALT: 12 U/L (ref 0–35)
AST: 15 U/L (ref 0–37)
Alkaline Phosphatase: 78 U/L (ref 39–117)
BUN: 19 mg/dL (ref 6–23)
CALCIUM: 9 mg/dL (ref 8.4–10.5)
CHLORIDE: 107 meq/L (ref 96–112)
CO2: 27 meq/L (ref 19–32)
CREATININE: 0.68 mg/dL (ref 0.40–1.20)
GFR: 116.99 mL/min (ref >60.00)
Glucose, Bld: 95 mg/dL (ref 70–99)
POTASSIUM: 4.3 meq/L (ref 3.5–5.1)
Sodium: 142 mEq/L (ref 135–145)
Total Bilirubin: 0.5 mg/dL (ref 0.2–1.2)
Total Protein: 6.7 g/dL (ref 6.0–8.3)

## 2017-01-06 LAB — HEMOGLOBIN A1C: Hgb A1c MFr Bld: 6.2 % (ref 4.6–6.5)

## 2017-01-06 LAB — LIPID PANEL
CHOL/HDL RATIO: 3
Cholesterol: 152 mg/dL (ref 0–200)
HDL: 59.2 mg/dL (ref >39.00)
LDL CALC: 84 mg/dL (ref 0–99)
NONHDL: 92.62
TRIGLYCERIDES: 42 mg/dL (ref 0.0–149.0)
VLDL: 8.4 mg/dL (ref 0.0–40.0)

## 2017-01-06 LAB — TSH: TSH: 1.43 u[IU]/mL (ref 0.35–4.50)

## 2017-01-06 LAB — IBC PANEL
Iron: 40 ug/dL — ABNORMAL LOW (ref 42–145)
Saturation Ratios: 14.7 % — ABNORMAL LOW (ref 20.0–50.0)
Transferrin: 195 mg/dL — ABNORMAL LOW (ref 212.0–360.0)

## 2017-01-06 LAB — B12 AND FOLATE PANEL
Folate: 8.1 ng/mL (ref >5.9)
Vitamin B-12: 300 pg/mL (ref 211–911)

## 2017-01-06 LAB — FERRITIN: FERRITIN: 68 ng/mL (ref 10.0–291.0)

## 2017-01-06 LAB — VITAMIN D 25 HYDROXY (VIT D DEFICIENCY, FRACTURES): VITD: 19.61 ng/mL — AB (ref 30.00–100.00)

## 2017-01-07 ENCOUNTER — Telehealth: Payer: Self-pay

## 2017-01-07 NOTE — Telephone Encounter (Signed)
Pt has been contacted to  Reschedule colonoscopy from  01/30/17 to another date because Dr. Servando SnareWohl will not be in town. She said she will call me back once she looks at her schedule.

## 2017-01-20 ENCOUNTER — Encounter: Payer: Self-pay | Admitting: Podiatry

## 2017-01-20 ENCOUNTER — Ambulatory Visit (INDEPENDENT_AMBULATORY_CARE_PROVIDER_SITE_OTHER): Payer: Federal, State, Local not specified - PPO | Admitting: Podiatry

## 2017-01-20 DIAGNOSIS — B353 Tinea pedis: Secondary | ICD-10-CM

## 2017-01-20 MED ORDER — TERBINAFINE HCL 250 MG PO TABS: 250.0000 mg | ORAL_TABLET | Freq: Every day | ORAL | 0 refills | Status: DC

## 2017-01-22 DIAGNOSIS — H2013 Chronic iridocyclitis, bilateral: Secondary | ICD-10-CM | POA: Diagnosis not present

## 2017-01-22 DIAGNOSIS — H21543 Posterior synechiae (iris), bilateral: Secondary | ICD-10-CM | POA: Diagnosis not present

## 2017-01-23 NOTE — Progress Notes (Signed)
   HPI: Pt is a 51 year old female presenting today with a complaint of painful calluses to bilateral plantar forefeet that has been present for the past several months. She also reports a non painful skin lesion to the left second toe. She has not done anything to treat the symptoms. She is here for further evaluation and treatment.     Physical Exam: General: The patient is alert and oriented x3 in no acute distress.  Dermatology: Pruritus to the interdigital areas to bilateral feet with hyperkeratosis. Skin is warm, dry and supple bilateral lower extremities. Negative for open lesions or macerations.  Vascular: Palpable pedal pulses bilaterally. No edema or erythema noted. Capillary refill within normal limits.  Neurological: Epicritic and protective threshold grossly intact bilaterally.   Musculoskeletal Exam: Range of motion within normal limits to all pedal and ankle joints bilateral. Muscle strength 5/5 in all groups bilateral.   Assessment: 1. Tinea pedis bilaterally   Plan of Care:  1. Patient was evaluated. 2. Prescription for Lamisil 250 mg #28 given to patient. 3. Return to clinic in 4 weeks.   Felecia ShellingBrent M. Marcelus Dubberly, DPM [facility]  Dr. Felecia ShellingBrent M. Robynne Roat, DPM    [address]                                        [place], [place] [address]                Office [phone]  Fax [phone]

## 2017-02-16 ENCOUNTER — Telehealth: Payer: Self-pay | Admitting: *Deleted

## 2017-02-16 NOTE — Telephone Encounter (Signed)
Pt states she was given a prescription for foot fungus, read the warning concerning sun exposure and she is in the sun all day every day, she drives a postal route, pt request an alternative medication.

## 2017-02-17 ENCOUNTER — Ambulatory Visit: Payer: Federal, State, Local not specified - PPO | Admitting: Podiatry

## 2017-02-17 NOTE — Telephone Encounter (Signed)
Patient has been notified of side effects.

## 2017-02-17 NOTE — Telephone Encounter (Signed)
All antifungal medications have that possible side effect. Thanks, Dr. Logan BoresEvans

## 2017-02-24 ENCOUNTER — Encounter: Payer: Self-pay | Admitting: Gastroenterology

## 2017-02-24 ENCOUNTER — Encounter: Payer: Self-pay | Admitting: *Deleted

## 2017-02-24 ENCOUNTER — Telehealth: Payer: Self-pay | Admitting: Family

## 2017-02-24 NOTE — Telephone Encounter (Signed)
Pt called and wanted to speak with in regards to her chart saying that she has diabetes. Pt was scheduling her colonoscopy and they asked if she was diabetic and she said no she was borderline and they told her that in her chart we show her as a diabetic. Pt would like this taken off her records. Please advise, thank you!  Call pt @ [phone]

## 2017-02-25 DIAGNOSIS — Z1231 Encounter for screening mammogram for malignant neoplasm of breast: Secondary | ICD-10-CM | POA: Diagnosis not present

## 2017-02-25 NOTE — Telephone Encounter (Signed)
Pt called back returning your call. Please advise, thank you! °

## 2017-02-25 NOTE — Telephone Encounter (Signed)
Left message for patient to return call back.  

## 2017-02-25 NOTE — Telephone Encounter (Signed)
Error

## 2017-02-25 NOTE — Telephone Encounter (Signed)
Patient has been informed that she does not have diabeties and that she is borderline. (Prediabetic)

## 2017-03-02 NOTE — Discharge Instructions (Signed)
General Anesthesia, Adult, Care After °These instructions provide you with information about caring for yourself after your procedure. Your health care provider may also give you more specific instructions. Your treatment has been planned according to current medical practices, but problems sometimes occur. Call your health care provider if you have any problems or questions after your procedure. °What can I expect after the procedure? °After the procedure, it is common to have: °· Vomiting. °· A sore throat. °· Mental slowness. ° °It is common to feel: °· Nauseous. °· Cold or shivery. °· Sleepy. °· Tired. °· Sore or achy, even in parts of your body where you did not have surgery. ° °Follow these instructions at home: °For at least 24 hours after the procedure: °· Do not: °? Participate in activities where you could fall or become injured. °? Drive. °? Use heavy machinery. °? Drink alcohol. °? Take sleeping pills or medicines that cause drowsiness. °? Make important decisions or sign legal documents. °? Take care of children on your own. °· Rest. °Eating and drinking °· If you vomit, drink water, juice, or soup when you can drink without vomiting. °· Drink enough fluid to keep your urine clear or pale yellow. °· Make sure you have little or no nausea before eating solid foods. °· Follow the diet recommended by your health care provider. °General instructions °· Have a responsible adult stay with you until you are awake and alert. °· Return to your normal activities as told by your health care provider. Ask your health care provider what activities are safe for you. °· Take over-the-counter and prescription medicines only as told by your health care provider. °· If you smoke, do not smoke without supervision. °· Keep all follow-up visits as told by your health care provider. This is important. °Contact a health care provider if: °· You continue to have nausea or vomiting at home, and medicines are not helpful. °· You  cannot drink fluids or start eating again. °· You cannot urinate after 8-12 hours. °· You develop a skin rash. °· You have fever. °· You have increasing redness at the site of your procedure. °Get help right away if: °· You have difficulty breathing. °· You have chest pain. °· You have unexpected bleeding. °· You feel that you are having a life-threatening or urgent problem. °This information is not intended to replace advice given to you by your health care provider. Make sure you discuss any questions you have with your health care provider. °Document Released: 10/13/2000 Document Revised: 12/10/2015 Document Reviewed: 06/21/2015 °Elsevier Interactive Patient Education © 2018 [employer]vier Inc. ° °

## 2017-03-04 ENCOUNTER — Telehealth: Payer: Self-pay | Admitting: *Deleted

## 2017-03-04 DIAGNOSIS — L301 Dyshidrosis [pompholyx]: Secondary | ICD-10-CM

## 2017-03-04 NOTE — Telephone Encounter (Signed)
Patient would like to receive medication. OTC medication does not work. Please advise.

## 2017-03-04 NOTE — Telephone Encounter (Signed)
Pt has requested to have a cream for her eczema Pharmacy [facility] in [place]  Pt contact  [phone]

## 2017-03-05 ENCOUNTER — Ambulatory Visit
Admission: RE | Admit: 2017-03-05 | Discharge: 2017-03-05 | Disposition: A | Discharge status: Home / Self Care | Payer: Federal, State, Local not specified - PPO | Source: Ambulatory Visit | Attending: Gastroenterology | Admitting: Gastroenterology

## 2017-03-05 ENCOUNTER — Ambulatory Visit: Payer: Federal, State, Local not specified - PPO | Admitting: Anesthesiology

## 2017-03-05 ENCOUNTER — Encounter
Admission: RE | Disposition: A | Discharge status: Home / Self Care | Payer: Self-pay | Source: Ambulatory Visit | Attending: Gastroenterology

## 2017-03-05 DIAGNOSIS — Z791 Long term (current) use of non-steroidal anti-inflammatories (NSAID): Secondary | ICD-10-CM | POA: Diagnosis not present

## 2017-03-05 DIAGNOSIS — Z79899 Other long term (current) drug therapy: Secondary | ICD-10-CM | POA: Insufficient documentation

## 2017-03-05 DIAGNOSIS — D649 Anemia, unspecified: Secondary | ICD-10-CM | POA: Insufficient documentation

## 2017-03-05 DIAGNOSIS — J45909 Unspecified asthma, uncomplicated: Secondary | ICD-10-CM | POA: Insufficient documentation

## 2017-03-05 DIAGNOSIS — Z880 Allergy status to penicillin: Secondary | ICD-10-CM | POA: Diagnosis not present

## 2017-03-05 DIAGNOSIS — Z1211 Encounter for screening for malignant neoplasm of colon: Secondary | ICD-10-CM | POA: Diagnosis not present

## 2017-03-05 DIAGNOSIS — M1711 Unilateral primary osteoarthritis, right knee: Secondary | ICD-10-CM | POA: Insufficient documentation

## 2017-03-05 HISTORY — PX: COLONOSCOPY WITH PROPOFOL: SHX5780

## 2017-03-05 HISTORY — DX: Motion sickness, initial encounter: T75.3XXA

## 2017-03-05 HISTORY — DX: Dermatitis, unspecified: L30.9

## 2017-03-05 HISTORY — DX: Unspecified osteoarthritis, unspecified site: M19.90

## 2017-03-05 HISTORY — DX: Prediabetes: R73.03

## 2017-03-05 SURGERY — COLONOSCOPY WITH PROPOFOL
Anesthesia: General | Wound class: Contaminated

## 2017-03-05 MED ORDER — TRIAMCINOLONE ACETONIDE 0.025 % EX CREA: 1.0000 "application " | TOPICAL_CREAM | Freq: Two times a day (BID) | CUTANEOUS | 1 refills | Status: DC

## 2017-03-05 MED ORDER — LACTATED RINGERS IV SOLN
INTRAVENOUS | Status: DC
  Administered 2017-03-05: 08:00:00 via INTRAVENOUS

## 2017-03-05 MED ORDER — LIDOCAINE HCL (CARDIAC) 20 MG/ML IV SOLN
INTRAVENOUS | Status: DC | PRN
  Administered 2017-03-05: 50 mg via INTRAVENOUS

## 2017-03-05 MED ORDER — ACETAMINOPHEN 325 MG PO TABS: 325.0000 mg | ORAL_TABLET | ORAL | Status: DC | PRN

## 2017-03-05 MED ORDER — PROPOFOL 10 MG/ML IV BOLUS
INTRAVENOUS | Status: DC | PRN
  Administered 2017-03-05 (×6): 50 mg via INTRAVENOUS

## 2017-03-05 MED ORDER — ACETAMINOPHEN 160 MG/5ML PO SOLN: 325.0000 mg | ORAL | Status: DC | PRN

## 2017-03-05 MED ORDER — STERILE WATER FOR IRRIGATION IR SOLN
Status: DC | PRN
  Administered 2017-03-05: 09:00:00

## 2017-03-05 SURGICAL SUPPLY — 23 items
CANISTER SUCT 1200ML W/VALVE (MISCELLANEOUS) ×2 IMPLANT
CLIP HMST 235XBRD CATH ROT (MISCELLANEOUS) IMPLANT
CLIP RESOLUTION 360 11X235 (MISCELLANEOUS)
FCP ESCP3.2XJMB 240X2.8X (MISCELLANEOUS)
FORCEPS BIOP RAD 4 LRG CAP 4 (CUTTING FORCEPS) IMPLANT
FORCEPS BIOP RJ4 240 W/NDL (MISCELLANEOUS)
FORCEPS ESCP3.2XJMB 240X2.8X (MISCELLANEOUS) IMPLANT
GOWN CVR UNV OPN BCK APRN NK (MISCELLANEOUS) ×2 IMPLANT
GOWN ISOL THUMB LOOP REG UNIV (MISCELLANEOUS) ×4
INJECTOR VARIJECT VIN23 (MISCELLANEOUS) IMPLANT
KIT DEFENDO VALVE AND CONN (KITS) IMPLANT
KIT ENDO PROCEDURE OLY (KITS) ×2 IMPLANT
MARKER SPOT ENDO TATTOO 5ML (MISCELLANEOUS) IMPLANT
PAD GROUND ADULT SPLIT (MISCELLANEOUS) IMPLANT
PROBE APC STR FIRE (PROBE) IMPLANT
RETRIEVER NET ROTH 2.5X230 LF (MISCELLANEOUS) IMPLANT
SNARE SHORT THROW 13M SML OVAL (MISCELLANEOUS) IMPLANT
SNARE SHORT THROW 30M LRG OVAL (MISCELLANEOUS) IMPLANT
SNARE SNG USE RND 15MM (INSTRUMENTS) IMPLANT
SPOT EX ENDOSCOPIC TATTOO (MISCELLANEOUS)
TRAP ETRAP POLY (MISCELLANEOUS) IMPLANT
VARIJECT INJECTOR VIN23 (MISCELLANEOUS)
WATER STERILE IRR 250ML POUR (IV SOLUTION) ×2 IMPLANT

## 2017-03-05 NOTE — Telephone Encounter (Signed)
Sent in -  Advise caution with frequent use as can cause discoloration of the skin. Specifically important since patient is African-American. Also advise caution when using any steroids on the face;  especially worried by discoloration and even thinning of the skin which tends to show more veins in skin

## 2017-03-05 NOTE — Anesthesia Procedure Notes (Signed)
Performed by: Labrenda Lasky Pre-anesthesia Checklist: Patient identified, Emergency Drugs available, Suction available, Timeout performed and Patient being monitored Patient Re-evaluated:Patient Re-evaluated prior to induction Oxygen Delivery Method: Nasal cannula Placement Confirmation: positive ETCO2       

## 2017-03-05 NOTE — Transfer of Care (Signed)
Immediate Anesthesia Transfer of Care Note  Patient: Hannah Robinson  Procedure(s) Performed: Procedure(s): COLONOSCOPY WITH PROPOFOL (N/A)  Patient Location: PACU  Anesthesia Type: General  Level of Consciousness: awake, alert  and patient cooperative  Airway and Oxygen Therapy: Patient Spontanous Breathing and Patient connected to supplemental oxygen  Post-op Assessment: Post-op Vital signs reviewed, Patient's Cardiovascular Status Stable, Respiratory Function Stable, Patent Airway and No signs of Nausea or vomiting  Post-op Vital Signs: Reviewed and stable  Complications: No apparent anesthesia complications

## 2017-03-05 NOTE — Anesthesia Postprocedure Evaluation (Signed)
Anesthesia Post Note  Patient: Hannah Robinson  Procedure(s) Performed: Procedure(s) (LRB): COLONOSCOPY WITH PROPOFOL (N/A)  Patient location during evaluation: PACU Anesthesia Type: General Level of consciousness: awake and alert and oriented Pain management: satisfactory to patient Vital Signs Assessment: post-procedure vital signs reviewed and stable Respiratory status: spontaneous breathing, nonlabored ventilation and respiratory function stable Cardiovascular status: blood pressure returned to baseline and stable Postop Assessment: Adequate PO intake and No signs of nausea or vomiting Anesthetic complications: no    Cherly BeachStella, Alese Furniss J

## 2017-03-05 NOTE — Anesthesia Preprocedure Evaluation (Signed)
Anesthesia Evaluation  Patient identified by MRN, date of birth, ID band Patient awake    Reviewed: Allergy & Precautions, H&P , NPO status , Patient's Chart, lab work & pertinent test results  Airway Mallampati: II  TM Distance: >3 FB Neck ROM: full    Dental no notable dental hx.    Pulmonary asthma ,    Pulmonary exam normal breath sounds clear to auscultation       Cardiovascular Normal cardiovascular exam Rhythm:regular Rate:Normal     Neuro/Psych    GI/Hepatic   Endo/Other    Renal/GU      Musculoskeletal   Abdominal   Peds  Hematology   Anesthesia Other Findings   Reproductive/Obstetrics                             Anesthesia Physical Anesthesia Plan  ASA: II  Anesthesia Plan: General   Post-op Pain Management:    Induction:   PONV Risk Score and Plan: 3 and Propofol infusion  Airway Management Planned:   Additional Equipment:   Intra-op Plan:   Post-operative Plan:   Informed Consent: I have reviewed the patients History and Physical, chart, labs and discussed the procedure including the risks, benefits and alternatives for the proposed anesthesia with the patient or authorized representative who has indicated his/her understanding and acceptance.     Plan Discussed with: CRNA  Anesthesia Plan Comments:         Anesthesia Quick Evaluation

## 2017-03-05 NOTE — H&P (Signed)
Midge Miniumarren Genevive Printup, MD [facility] [address] [place], [place] [address] Phone: [phone] Fax : [phone]  Primary Care Physician:  Allegra GranaArnett, Margaret G, FNP Primary Gastroenterologist:  Dr. Servando SnareWohl  Pre-Procedure History & Physical: HPI:  Hannah Robinson is a 51 y.o. female is here for a screening colonoscopy.   Past Medical History:  Diagnosis Date  . Anemia    iron infusions in past  . Arthritis    right knee (also torn meniscus)  . Childhood asthma   . Eczema   . Iritis    Followed by Dr. Marti Sleighhurman  . Motion sickness    back seat cars  . Prediabetes   . Urinary tract bacterial infections     Past Surgical History:  Procedure Laterality Date  . BREAST SURGERY  1988   benign  . CATARACT EXTRACTION W/ INTRAOCULAR LENS IMPLANT Left   . VAGINAL DELIVERY     2    Prior to Admission medications   Medication Sig Start Date End Date Taking? Authorizing Provider  albuterol (PROAIR HFA) 108 (90 Base) MCG/ACT inhaler Inhale 2 puffs into the lungs every 4 (four) hours as needed. 12/07/15  Yes Shelia MediaWalker, Jennifer A, MD  Ascorbic Acid (VITAMIN C PO) Take by mouth.   Yes [provider]  brimonidine (ALPHAGAN) 0.2 % ophthalmic solution Apply to eye. 10/23/16 10/23/17 Yes [provider]  cetirizine (ZYRTEC) 10 MG tablet Take 1 tablet (10 mg total) by mouth daily. 06/18/16  Yes Allegra GranaArnett, Margaret G, FNP  Cholecalciferol (VITAMIN D PO) Take by mouth.   Yes [provider]  DUREZOL 0.05 % EMUL PLACE 1 DROP INTO THE RIGHT EYE ONCE DAILY. 10/31/16  Yes [provider]  fluticasone (FLONASE) 50 MCG/ACT nasal spray Place 2 sprays into both nostrils daily. 06/18/16  Yes Arnett, Lyn RecordsMargaret G, FNP  fluticasone furoate-vilanterol (BREO ELLIPTA) 100-25 MCG/INH AEPB Inhale 1 puff into the lungs daily.   Yes [provider]  IRON PO Take by mouth.   Yes [provider]  ketorolac (ACULAR) 0.5 % ophthalmic solution PLACE 1 DROP INTO THE RIGHT EYE 4  (FOUR) TIMES DAILY. 11/20/16  Yes [provider]  meloxicam (MOBIC) 15 MG tablet Take 1 tablet (15 mg total) by mouth daily. 06/29/16  Yes Allegra GranaArnett, Margaret G, FNP  omeprazole (PRILOSEC) 20 MG capsule Take 1 capsule (20 mg total) by mouth daily. 12/11/16  Yes Allegra GranaArnett, Margaret G, FNP  terbinafine (LAMISIL) 250 MG tablet Take 1 tablet (250 mg total) by mouth daily. 01/20/17  Yes Felecia ShellingEvans, Brent M, DPM    Allergies as of 12/12/2016 - Review Complete 12/11/2016  Allergen Reaction Noted  . Other Hives 09/17/2015  . Amoxicillin  08/25/2011    Family History  Problem Relation Age of Onset  . Lung cancer Maternal Aunt   . Alcohol abuse Maternal Aunt   . Clotting disorder Mother   . Arthritis Mother   . Heart disease Mother   . Bipolar disorder Brother   . Stroke Maternal Grandmother   . Cancer Maternal Aunt        lung  . Asthma Son     Social History   Social History  . Marital status: Single    Spouse name: N/A  . Number of children: 2  . 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 Smoking status: Never Smoker  . Smokeless tobacco: Never Used  . Alcohol use 6.6 oz/week  4 Standard drinks or equivalent, 7 Glasses of wine per week     Comment: Wine  . Drug use: No  . Sexual activity: Not on file   Other Topics Concern  . Not on file   Social History Narrative   Lives in [place] with sons, from [place].      Work - Runner, broadcasting/film/video, Forensic scientist   Diet - regular   Exercise - walks with work    Review of Systems: See HPI, otherwise negative ROS  Physical Exam: Ht 5\' 6"  (1.676 m)   Wt 249 lb (112.9 kg)   BMI 40.19 kg/m  General:   Alert,  pleasant and cooperative in NAD Head:  Normocephalic and atraumatic. Neck:  Supple; no masses or thyromegaly. Lungs:  Clear throughout to auscultation.    Heart:  Regular rate and rhythm. Abdomen:  Soft, nontender and nondistended. Normal bowel sounds, without guarding, and  without rebound.   Neurologic:  Alert and  oriented x4;  grossly normal neurologically.  Impression/Plan: Hannah Robinson is now here to undergo a screening colonoscopy.  Risks, benefits, and alternatives regarding colonoscopy have been reviewed with the patient.  Questions have been answered.  All parties agreeable.

## 2017-03-05 NOTE — Telephone Encounter (Signed)
Patient has been informed.

## 2017-03-05 NOTE — Op Note (Signed)
[facility] Gastroenterology Patient Name: Hannah Robinson Procedure Date: 03/05/2017 8:11 AM MRN: [id number] Account #: [id number] Date of Birth: 1966/06/11 Admit Type: Outpatient Age: 51 Room: [facility] OR ROOM 01 Gender: Female Note Status: Finalized Procedure:            Colonoscopy Indications:          Screening for colorectal malignant neoplasm Providers:            Midge Minium MD, MD Referring MD:         Lyn Records. Arnett (Referring MD) Medicines:            Propofol per Anesthesia Complications:        No immediate complications. Procedure:            Pre-Anesthesia Assessment:                       - Prior to the procedure, a History and Physical was                        performed, and patient medications and allergies were                        reviewed. The patient's tolerance of previous                        anesthesia was also reviewed. The risks and benefits of                        the procedure and the sedation options and risks were                        discussed with the patient. All questions were                        answered, and informed consent was obtained. Prior                        Anticoagulants: The patient has taken no previous                        anticoagulant or antiplatelet agents. ASA Grade                        Assessment: II - A patient with mild systemic disease.                        After reviewing the risks and benefits, the patient was                        deemed in satisfactory condition to undergo the                        procedure.                       After obtaining informed consent, the colonoscope was                        passed under direct vision. Throughout the procedure,  the patient's blood pressure, pulse, and oxygen                        saturations were monitored continuously. The Olympus                        190 Colonoscope [id number]) was introduced through the                         anus and advanced to the the cecum, identified by                        appendiceal orifice and ileocecal valve. The                        colonoscopy was performed without difficulty. The                        patient tolerated the procedure well. The quality of                        the bowel preparation was fair. Findings:      The perianal and digital rectal examinations were normal.      A small amount of stool was found in the entire colon.      Non-bleeding internal hemorrhoids were found during retroflexion. The       hemorrhoids were Grade I (internal hemorrhoids that do not prolapse). Impression:           - Preparation of the colon was fair.                       - Stool in the entire examined colon.                       - Non-bleeding internal hemorrhoids.                       - No specimens collected. Recommendation:       - Discharge patient to home.                       - Resume previous diet.                       - Continue present medications.                       - Repeat colonoscopy in 10 years for screening unless                        any change in family history or lower GI problems. Procedure Code(s):    --- Professional ---                       [id number], Colonoscopy, flexible; diagnostic, including                        collection of specimen(s) by brushing or washing, when                        performed (separate procedure) Diagnosis Code(s):    --- Professional ---  Z12.11, Encounter for screening for malignant neoplasm                        of colon CPT copyright 2016 American Medical Association. All rights reserved. The codes documented in this report are preliminary and upon coder review may  be revised to meet current compliance requirements. Midge Miniumarren Devanny Palecek MD, MD 03/05/2017 8:37:45 AM This report has been signed electronically. Number of Addenda: 0 Note Initiated On: 03/05/2017 8:11 AM Scope  Withdrawal Time: 0 hours 6 minutes 29 seconds  Total Procedure Duration: 0 hours 10 minutes 52 seconds       [facility]

## 2017-03-06 ENCOUNTER — Encounter: Payer: Self-pay | Admitting: Gastroenterology

## 2017-03-31 ENCOUNTER — Ambulatory Visit: Payer: Federal, State, Local not specified - PPO | Admitting: Podiatry

## 2017-04-02 DIAGNOSIS — H35353 Cystoid macular degeneration, bilateral: Secondary | ICD-10-CM | POA: Diagnosis not present

## 2017-04-02 DIAGNOSIS — H21543 Posterior synechiae (iris), bilateral: Secondary | ICD-10-CM | POA: Diagnosis not present

## 2017-04-02 DIAGNOSIS — H2013 Chronic iridocyclitis, bilateral: Secondary | ICD-10-CM | POA: Diagnosis not present

## 2017-04-18 ENCOUNTER — Other Ambulatory Visit: Payer: Self-pay | Admitting: Family

## 2017-04-18 DIAGNOSIS — R1013 Epigastric pain: Secondary | ICD-10-CM

## 2017-05-07 ENCOUNTER — Ambulatory Visit
Admission: EM | Admit: 2017-05-07 | Discharge: 2017-05-07 | Disposition: A | Discharge status: Home / Self Care | Payer: Federal, State, Local not specified - PPO | Attending: Family Medicine | Admitting: Family Medicine

## 2017-05-07 ENCOUNTER — Encounter: Payer: Self-pay | Admitting: Emergency Medicine

## 2017-05-07 DIAGNOSIS — N3001 Acute cystitis with hematuria: Secondary | ICD-10-CM

## 2017-05-07 DIAGNOSIS — B379 Candidiasis, unspecified: Secondary | ICD-10-CM | POA: Diagnosis not present

## 2017-05-07 LAB — URINALYSIS, COMPLETE (UACMP) WITH MICROSCOPIC
Bilirubin Urine: NEGATIVE
GLUCOSE, UA: NEGATIVE mg/dL
KETONES UR: NEGATIVE mg/dL
NITRITE: NEGATIVE
PH: 7 (ref 5.0–8.0)
Protein, ur: 30 mg/dL — AB
SPECIFIC GRAVITY, URINE: 1.025 (ref 1.005–1.030)

## 2017-05-07 MED ORDER — NITROFURANTOIN MONOHYD MACRO 100 MG PO CAPS: 100.0000 mg | ORAL_CAPSULE | Freq: Two times a day (BID) | ORAL | 0 refills | Status: DC

## 2017-05-07 MED ORDER — FLUCONAZOLE 150 MG PO TABS
150.0000 mg | ORAL_TABLET | Freq: Once | ORAL | 0 refills | Status: AC
Start: 2017-05-07 — End: 2017-05-07

## 2017-05-07 NOTE — Discharge Instructions (Signed)
Medications as prescribed. ° °Take care ° °Dr. Tildon Silveria  °

## 2017-05-07 NOTE — ED Provider Notes (Signed)
[facility]    CSN: [id number] Arrival date & time: 05/07/17  1842 History   Chief Complaint Chief Complaint  Patient presents with  . Dysuria  . Urinary Frequency   HPI  51 year old female presents with the above complaints.  Symptoms started last week. Mild in severity. She's had dysuria and urinary frequency. Some urgency. She's had some blood with wiping. No fevers or chills. No flank pain. No abdominal pain. No medications or interventions tried. No known exacerbating or relieving factors. No other associated symptoms. No other complaints at this time.   Past Medical History:  Diagnosis Date  . Anemia    iron infusions in past  . Arthritis    right knee (also torn meniscus)  . Childhood asthma   . Eczema   . Iritis    Followed by Dr. Marti Sleigh  . Motion sickness    back seat cars  . Prediabetes   . Urinary tract bacterial infections     Patient Active Problem List   Diagnosis Date Noted  . Special screening for malignant neoplasms, colon   . Epigastric pain 12/11/2016  . Acute bronchitis 06/18/2016  . Skin lesion 12/07/2015  . Right knee pain 10/12/2015  . BMI 30.0-30.9,adult 12/04/2014  . Candidal skin infection 12/04/2014  . Routine general medical examination at a health care facility 09/02/2013  . Dyshidrotic eczema 09/02/2013  . Dysuria 08/09/2012  . Vitamin D deficiency 08/09/2012  . Iron deficiency anemia 03/01/2012  . Screening for skin cancer 03/01/2012  . Iritis 03/01/2012  . Asthma 08/25/2011  . Rhinitis, allergic 08/25/2011  . Reflux 08/25/2011    Past Surgical History:  Procedure Laterality Date  . BREAST SURGERY  1988   benign  . CATARACT EXTRACTION W/ INTRAOCULAR LENS IMPLANT Left   . COLONOSCOPY WITH PROPOFOL N/A 03/05/2017   Procedure: COLONOSCOPY WITH PROPOFOL;  Surgeon: Midge Minium, MD;  Location: [facility];  Service: Endoscopy;  Laterality: N/A;  . VAGINAL DELIVERY     2    OB History    No data available        Home Medications    Prior to Admission medications   Medication Sig Start Date End Date Taking? Authorizing Provider  albuterol (PROAIR HFA) 108 (90 Base) MCG/ACT inhaler Inhale 2 puffs into the lungs every 4 (four) hours as needed. 12/07/15  Yes Shelia Media, MD  brimonidine (ALPHAGAN) 0.2 % ophthalmic solution Apply to eye. 10/23/16 10/23/17 Yes [provider]  DUREZOL 0.05 % EMUL PLACE 1 DROP INTO THE RIGHT EYE ONCE DAILY. 10/31/16  Yes [provider]  fluticasone (FLONASE) 50 MCG/ACT nasal spray Place 2 sprays into both nostrils daily. 06/18/16  Yes Arnett, Lyn Records, FNP  fluticasone furoate-vilanterol (BREO ELLIPTA) 100-25 MCG/INH AEPB Inhale 1 puff into the lungs daily.   Yes [provider]  ketorolac (ACULAR) 0.5 % ophthalmic solution PLACE 1 DROP INTO THE RIGHT EYE 4 (FOUR) TIMES DAILY. 11/20/16  Yes [provider]  meloxicam (MOBIC) 15 MG tablet Take 1 tablet (15 mg total) by mouth daily. 06/29/16  Yes Allegra Grana, FNP  triamcinolone (KENALOG) 0.025 % cream Apply 1 application topically 2 (two) times daily. 03/05/17  Yes Allegra Grana, FNP  Ascorbic Acid (VITAMIN C PO) Take by mouth.    [provider]  cetirizine (ZYRTEC) 10 MG tablet Take 1 tablet (10 mg total) by mouth daily. 06/18/16   Allegra Grana, FNP  Cholecalciferol (VITAMIN D PO) Take by mouth.  [provider]  fluconazole (DIFLUCAN) 150 MG tablet Take 1 tablet (150 mg total) by mouth once. Additional dose in 72 hours. 05/07/17 05/07/17  Everlene Other G, DO  IRON PO Take by mouth.    [provider]  nitrofurantoin, macrocrystal-monohydrate, (MACROBID) 100 MG capsule Take 1 capsule (100 mg total) by mouth 2 (two) times daily. 05/07/17   Tommie Sams, DO  omeprazole (PRILOSEC) 20 MG capsule TAKE 1 CAPSULE BY MOUTH EVERY DAY 04/20/17   Allegra Grana, FNP  terbinafine (LAMISIL) 250 MG tablet Take 1 tablet (250 mg total) by mouth  daily. 01/20/17   Felecia Shelling, DPM    Family History Family History  Problem Relation Age of Onset  . Lung cancer Maternal Aunt   . Alcohol abuse Maternal Aunt   . Clotting disorder Mother   . Arthritis Mother   . Heart disease Mother   . Bipolar disorder Brother   . Stroke Maternal Grandmother   . Cancer Maternal Aunt        lung  . Asthma Son     Social History Social History  Substance Use Topics  . Smoking status: Never Smoker  . Smokeless tobacco: Never Used  . Alcohol use 6.6 oz/week    4 Standard drinks or equivalent, 7 Glasses of wine per week     Comment: Wine     Allergies   Other; Penicillins; and Amoxicillin   Review of Systems Review of Systems  Constitutional: Negative.   Gastrointestinal: Negative.   Genitourinary: Positive for dysuria, frequency, hematuria and urgency.   Physical Exam Triage Vital Signs ED Triage Vitals [05/07/17 1901]  Enc Vitals Group     BP 135/80     Pulse Rate 77     Resp 16     Temp 98 F (36.7 C)     Temp Source Oral     SpO2 100 %     Weight 250 lb 6.4 oz (113.6 kg)     Height 5\' 6"  (1.676 m)     Head Circumference      Peak Flow      Pain Score 5     Pain Loc      Pain Edu?      Excl. in GC?    Updated Vital Signs BP 135/80 (BP Location: Left Arm)   Pulse 77   Temp 98 F (36.7 C) (Oral)   Resp 16   Ht 5\' 6"  (1.676 m)   Wt 250 lb 6.4 oz (113.6 kg)   SpO2 100%   BMI 40.42 kg/m   Physical Exam  Constitutional: She is oriented to person, place, and time. She appears well-developed. No distress.  Cardiovascular: Normal rate and regular rhythm.   No murmur heard. Pulmonary/Chest: Effort normal and breath sounds normal. She has no wheezes. She has no rales.  Abdominal: Soft. She exhibits no distension and no mass. There is no tenderness. There is no rebound.  Neurological: She is alert and oriented to person, place, and time.  Psychiatric: She has a normal mood and affect.  Vitals reviewed.   UC  Treatments / Results  Labs (all labs ordered are listed, but only abnormal results are displayed) Labs Reviewed  URINALYSIS, COMPLETE (UACMP) WITH MICROSCOPIC - Abnormal; Notable for the following:       Result Value   APPearance CLOUDY (*)    Hgb urine dipstick LARGE (*)    Protein, ur 30 (*)    Leukocytes, UA SMALL (*)  Squamous Epithelial / LPF 6-30 (*)    Bacteria, UA MANY (*)    All other components within normal limits  URINE CULTURE    EKG  EKG Interpretation None       Radiology No results found.  Procedures Procedures (including critical care time)  Medications Ordered in UC Medications - No data to display   Initial Impression / Assessment and Plan / UC Course  I have reviewed the triage vital signs and the nursing notes.  Pertinent labs & imaging results that were available during my care of the patient were reviewed by me and considered in my medical decision making (see chart for details).    51 year old female presents with symptoms of UTI. Urinalysis with large hemoglobin, small leukocytes. Given history and urinalysis, I suspect UTI. Treating empirically with Macrobid. Waiting for culture. Additionally, urine revealed yeast. Treating with Diflucan. Final Clinical Impressions(s) / UC Diagnoses   Final diagnoses:  Acute cystitis with hematuria  Yeast infection    New Prescriptions Discharge Medication List as of 05/07/2017  7:45 PM    START taking these medications   Details  fluconazole (DIFLUCAN) 150 MG tablet Take 1 tablet (150 mg total) by mouth once. Additional dose in 72 hours., Starting Thu 05/07/2017, Normal    nitrofurantoin, macrocrystal-monohydrate, (MACROBID) 100 MG capsule Take 1 capsule (100 mg total) by mouth 2 (two) times daily., Starting Thu 05/07/2017, Normal       Controlled Substance Prescriptions [place] Controlled Substance Registry consulted? Not Applicable   Tommie SamsCook, Correen Bubolz G, DO 05/07/17 2052

## 2017-05-07 NOTE — ED Triage Notes (Signed)
Patient in tonight c/o dysuria and urinary frequency since last week. Patient denies fever.

## 2017-05-09 LAB — URINE CULTURE

## 2017-05-12 ENCOUNTER — Ambulatory Visit: Payer: Federal, State, Local not specified - PPO | Admitting: Family

## 2017-05-28 DIAGNOSIS — H21541 Posterior synechiae (iris), right eye: Secondary | ICD-10-CM | POA: Diagnosis not present

## 2017-05-28 DIAGNOSIS — H2013 Chronic iridocyclitis, bilateral: Secondary | ICD-10-CM | POA: Diagnosis not present

## 2017-05-28 DIAGNOSIS — H25811 Combined forms of age-related cataract, right eye: Secondary | ICD-10-CM | POA: Diagnosis not present

## 2017-05-28 DIAGNOSIS — H40023 Open angle with borderline findings, high risk, bilateral: Secondary | ICD-10-CM | POA: Diagnosis not present

## 2017-06-23 DIAGNOSIS — H5703 Miosis: Secondary | ICD-10-CM | POA: Diagnosis not present

## 2017-06-23 DIAGNOSIS — H2511 Age-related nuclear cataract, right eye: Secondary | ICD-10-CM | POA: Diagnosis not present

## 2017-06-23 DIAGNOSIS — H209 Unspecified iridocyclitis: Secondary | ICD-10-CM | POA: Diagnosis not present

## 2017-06-23 DIAGNOSIS — H21541 Posterior synechiae (iris), right eye: Secondary | ICD-10-CM | POA: Diagnosis not present

## 2017-06-23 DIAGNOSIS — Z880 Allergy status to penicillin: Secondary | ICD-10-CM | POA: Diagnosis not present

## 2017-06-23 DIAGNOSIS — T888 Other specified complications of surgical and medical care, not elsewhere classified: Secondary | ICD-10-CM | POA: Diagnosis not present

## 2017-06-23 DIAGNOSIS — Z6841 Body Mass Index (BMI) 40.0 and over, adult: Secondary | ICD-10-CM | POA: Diagnosis not present

## 2017-06-25 DIAGNOSIS — H2013 Chronic iridocyclitis, bilateral: Secondary | ICD-10-CM | POA: Diagnosis not present

## 2017-07-29 ENCOUNTER — Telehealth: Payer: Self-pay | Admitting: Family

## 2017-07-29 NOTE — Telephone Encounter (Signed)
Copied from CRM [id number]. Topic: Quick Communication - See Telephone Encounter >> Jul 29, 2017 12:24 PM Windy KalataMichael, Kaliyan Osbourn L, NT wrote: CRM for notification. See Telephone encounter for:  07/29/17.  Patient is requesting a RX for Breo she states when she was [facility]a t [facility] they gave her samples to try and she liked them but never returned to the clinic. Patient is requesting a RX.  [facility] - on file

## 2017-07-29 NOTE — Telephone Encounter (Signed)
We have never prescribed the Breo inhalers for her. Please advise.

## 2017-07-30 MED ORDER — FLUTICASONE FUROATE-VILANTEROL 100-25 MCG/INH IN AEPB: 1.0000 | INHALATION_SPRAY | Freq: Every day | RESPIRATORY_TRACT | 2 refills | Status: DC

## 2017-07-30 NOTE — Telephone Encounter (Signed)
Pt aware.

## 2017-07-30 NOTE — Telephone Encounter (Signed)
Ok to refill,  Sent.

## 2017-08-20 DIAGNOSIS — H35353 Cystoid macular degeneration, bilateral: Secondary | ICD-10-CM | POA: Diagnosis not present

## 2017-08-20 DIAGNOSIS — H2013 Chronic iridocyclitis, bilateral: Secondary | ICD-10-CM | POA: Diagnosis not present

## 2017-08-20 DIAGNOSIS — H35373 Puckering of macula, bilateral: Secondary | ICD-10-CM | POA: Diagnosis not present

## 2017-09-02 ENCOUNTER — Other Ambulatory Visit: Payer: Self-pay | Admitting: Family

## 2017-09-02 DIAGNOSIS — R1013 Epigastric pain: Secondary | ICD-10-CM

## 2017-10-15 DIAGNOSIS — H2013 Chronic iridocyclitis, bilateral: Secondary | ICD-10-CM | POA: Diagnosis not present

## 2017-10-15 DIAGNOSIS — H35353 Cystoid macular degeneration, bilateral: Secondary | ICD-10-CM | POA: Diagnosis not present

## 2017-10-15 DIAGNOSIS — H35373 Puckering of macula, bilateral: Secondary | ICD-10-CM | POA: Diagnosis not present

## 2017-10-16 ENCOUNTER — Telehealth: Payer: Self-pay

## 2017-10-16 DIAGNOSIS — Z0000 Encounter for general adult medical examination without abnormal findings: Secondary | ICD-10-CM

## 2017-10-16 NOTE — Telephone Encounter (Signed)
Copied from CRM [phone]#[id number]. Topic: Inquiry >> Oct 15, 2017  4:51 PM Hannah BergeronBarksdale, Hannah B wrote: Reason for CRM: pt has a cpe in May and wants fasting lab orders created, call pt to advise

## 2017-10-19 NOTE — Telephone Encounter (Signed)
Lab appointment scheduled 

## 2017-10-19 NOTE — Telephone Encounter (Signed)
Ordered Please have pt make fasting lab appt

## 2017-10-19 NOTE — Addendum Note (Signed)
Addended by: Allegra GranaARNETT, Nikita Humble G on: 10/19/2017 09:57 AM   Modules accepted: Orders

## 2017-11-06 ENCOUNTER — Other Ambulatory Visit: Payer: Self-pay | Admitting: Internal Medicine

## 2017-11-13 ENCOUNTER — Ambulatory Visit: Payer: Federal, State, Local not specified - PPO | Admitting: Podiatry

## 2017-11-13 ENCOUNTER — Other Ambulatory Visit: Payer: Self-pay

## 2017-11-13 ENCOUNTER — Ambulatory Visit
Admission: EM | Admit: 2017-11-13 | Discharge: 2017-11-13 | Disposition: A | Discharge status: Home / Self Care | Payer: Federal, State, Local not specified - PPO | Attending: Family Medicine | Admitting: Family Medicine

## 2017-11-13 ENCOUNTER — Encounter: Payer: Self-pay | Admitting: Podiatry

## 2017-11-13 ENCOUNTER — Encounter: Payer: Self-pay | Admitting: Emergency Medicine

## 2017-11-13 ENCOUNTER — Ambulatory Visit (INDEPENDENT_AMBULATORY_CARE_PROVIDER_SITE_OTHER): Payer: Federal, State, Local not specified - PPO

## 2017-11-13 DIAGNOSIS — S92512A Displaced fracture of proximal phalanx of left lesser toe(s), initial encounter for closed fracture: Secondary | ICD-10-CM | POA: Diagnosis not present

## 2017-11-13 DIAGNOSIS — W231XXA Caught, crushed, jammed, or pinched between stationary objects, initial encounter: Secondary | ICD-10-CM | POA: Diagnosis not present

## 2017-11-13 DIAGNOSIS — S92514A Nondisplaced fracture of proximal phalanx of right lesser toe(s), initial encounter for closed fracture: Secondary | ICD-10-CM | POA: Diagnosis not present

## 2017-11-13 DIAGNOSIS — S92511A Displaced fracture of proximal phalanx of right lesser toe(s), initial encounter for closed fracture: Secondary | ICD-10-CM | POA: Diagnosis not present

## 2017-11-13 MED ORDER — IBUPROFEN 800 MG PO TABS: 800.0000 mg | ORAL_TABLET | Freq: Three times a day (TID) | ORAL | 0 refills | Status: DC

## 2017-11-13 MED ORDER — MELOXICAM 15 MG PO TABS: 15.0000 mg | ORAL_TABLET | Freq: Every day | ORAL | 1 refills | Status: AC

## 2017-11-13 NOTE — ED Provider Notes (Signed)
[facility]    CSN: [id number] Arrival date & time: 11/13/17  0806     History   Chief Complaint Chief Complaint  Patient presents with  . Toe Injury    HPI Hannah Robinson is a 52 y.o. female.   HPI  52 year old female presents with injury to her right fourth toe.  Last night at home she stubbed it on the door frame.  Since that time she has had pain and swelling in the toe and has found that is comfortable to bear weight on the foot.  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          Past Medical History:  Diagnosis Date  . Anemia    iron infusions in past  . Arthritis    right knee (also torn meniscus)  . Childhood asthma   . Eczema   . Iritis    Followed by Dr. Marti Sleigh  . Motion sickness    back seat cars  . Prediabetes   . Urinary tract bacterial infections     Patient Active Problem List   Diagnosis Date Noted  . Special screening for malignant neoplasms, colon   . Epigastric pain 12/11/2016  . Acute bronchitis 06/18/2016  . Skin lesion 12/07/2015  . Right knee pain 10/12/2015  . BMI 30.0-30.9,adult 12/04/2014  . Candidal skin infection 12/04/2014  . Routine general medical examination at a health care facility 09/02/2013  . Dyshidrotic eczema 09/02/2013  . Dysuria 08/09/2012  . Vitamin D deficiency 08/09/2012  . Iron deficiency anemia 03/01/2012  . Screening for skin cancer 03/01/2012  . Iritis 03/01/2012  . Asthma 08/25/2011  . Rhinitis, allergic 08/25/2011  . Reflux 08/25/2011    Past Surgical History:  Procedure Laterality Date  . BREAST SURGERY  1988   benign  . CATARACT EXTRACTION W/ INTRAOCULAR LENS IMPLANT Left   . COLONOSCOPY WITH PROPOFOL N/A 03/05/2017   Procedure: COLONOSCOPY WITH PROPOFOL;  Surgeon: Midge Minium, MD;  Location: [facility];  Service: Endoscopy;  Laterality: N/A;  . VAGINAL DELIVERY     2    OB History   None      Home Medications     Prior to Admission medications   Medication Sig Start Date End Date Taking? Authorizing Provider  albuterol (PROAIR HFA) 108 (90 Base) MCG/ACT inhaler Inhale 2 puffs into the lungs every 4 (four) hours as needed. 12/07/15  Yes Shelia Media, MD  DUREZOL 0.05 % EMUL PLACE 1 DROP INTO THE RIGHT EYE ONCE DAILY. 10/31/16  Yes [provider]  meloxicam (MOBIC) 15 MG tablet Take 1 tablet (15 mg total) by mouth daily. 06/29/16  Yes Allegra Grana, FNP  prednisoLONE 5 MG TABS tablet Take 2.5 mg by mouth.   Yes [provider]  BREO ELLIPTA 100-25 MCG/INH AEPB TAKE 1 PUFF BY MOUTH EVERY DAY 11/09/17   Allegra Grana, FNP  cetirizine (ZYRTEC) 10 MG tablet Take 1 tablet (10 mg total) by mouth daily. 06/18/16   Allegra Grana, FNP  Cholecalciferol (VITAMIN D PO) Take by mouth.    [provider]  fluticasone (FLONASE) 50 MCG/ACT nasal spray Place 2 sprays into both nostrils daily. 06/18/16   Allegra Grana, FNP  ibuprofen (ADVIL,MOTRIN) 800 MG tablet Take 1 tablet (800 mg total) by mouth 3 (three) times daily. 11/13/17   Lutricia Feil, PA-C  ketorolac (ACULAR) 0.5 % ophthalmic solution PLACE 1 DROP INTO THE  RIGHT EYE 4 (FOUR) TIMES DAILY. 11/20/16   [provider]  omeprazole (PRILOSEC) 20 MG capsule TAKE 1 CAPSULE BY MOUTH EVERY DAY 09/08/17   Sherlene Shamsullo, Teresa L, MD  norethindrone (MICRONOR,CAMILA,ERRIN) 0.35 MG tablet Take 1 tablet by mouth daily.  10/08/11  [provider]    Family History Family History  Problem Relation Age of Onset  . Lung cancer Maternal Aunt   . Alcohol abuse Maternal Aunt   . Clotting disorder Mother   . Arthritis Mother   . Heart disease Mother   . Bipolar disorder Brother   . Stroke Maternal Grandmother   . Cancer Maternal Aunt        lung  . Asthma Son     Social History Social History   Tobacco Use  . Smoking status: Never Smoker  . Smokeless tobacco: Never Used  Substance Use Topics  . Alcohol  use: Yes    Alcohol/week: 6.6 oz    Types: 4 Standard drinks or equivalent, 7 Glasses of wine per week    Comment: Wine  . Drug use: No     Allergies   Other; Penicillins; and Amoxicillin   Review of Systems Review of Systems  Constitutional: Positive for activity change. Negative for chills, fatigue and fever.  Musculoskeletal: Positive for joint swelling.  All other systems reviewed and are negative.    Physical Exam Triage Vital Signs ED Triage Vitals  Enc Vitals Group     BP 11/13/17 0823 125/76     Pulse Rate 11/13/17 0823 66     Resp 11/13/17 0823 18     Temp 11/13/17 0823 97.9 F (36.6 C)     Temp Source 11/13/17 0823 Oral     SpO2 11/13/17 0823 100 %     Weight 11/13/17 0827 270 lb (122.5 kg)     Height 11/13/17 0827 5\' 6"  (1.676 m)     Head Circumference --      Peak Flow --      Pain Score 11/13/17 0826 9     Pain Loc --      Pain Edu? --      Excl. in GC? --    No data found.  Updated Vital Signs BP 125/76 (BP Location: Left Arm)   Pulse 66   Temp 97.9 F (36.6 C) (Oral)   Resp 18   Ht 5\' 6"  (1.676 m)   Wt 270 lb (122.5 kg)   SpO2 100%   BMI 43.58 kg/m   Visual Acuity Right Eye Distance:   Left Eye Distance:   Bilateral Distance:    Right Eye Near:   Left Eye Near:    Bilateral Near:     Physical Exam  Constitutional: She is oriented to person, place, and time. She appears well-developed and well-nourished. No distress.  HENT:  Head: Normocephalic.  Eyes: Pupils are equal, round, and reactive to light. Right eye exhibits no discharge. Left eye exhibits no discharge.  Neck: Normal range of motion.  Musculoskeletal: She exhibits edema and tenderness.  Examination of the right fourth toe shows mild swelling and mild ecchymosis.  Maximal tenderness is localized over the proximal phalanx.  It is not involve the MTP or PIP.  No noticeable deformity.  Metatarsals are without tenderness.  Neurological: She is alert and oriented to person,  place, and time.  Skin: Skin is warm and dry. She is not diaphoretic.  Psychiatric: She has a normal mood and affect. Her behavior is normal. Judgment and  thought content normal.  Nursing note and vitals reviewed.    UC Treatments / Results  Labs (all labs ordered are listed, but only abnormal results are displayed) Labs Reviewed - No data to display  EKG None Radiology Dg Foot Complete Right  Result Date: 11/13/2017 CLINICAL DATA:  Foot injury after hitting a door last night. EXAM: RIGHT FOOT COMPLETE - 3+ VIEW COMPARISON:  None. FINDINGS: There is a minimally displaced oblique fracture through the fourth proximal phalanx shaft. Mild osteoarthritis of the first MTP joint and midfoot. Large plantar enthesophyte. Bone mineralization is normal. Soft tissues are unremarkable. IMPRESSION: Minimally displaced oblique fracture through the fourth proximal phalanx. Electronically Signed   By: Obie Dredge M.D.   On: 11/13/2017 09:20    Procedures Procedures (including critical care time)  Medications Ordered in UC Medications - No data to display   Initial Impression / Assessment and Plan / UC Course  I have reviewed the triage vital signs and the nursing notes.  Pertinent labs & imaging results that were available during my care of the patient were reviewed by me and considered in my medical decision making (see chart for details).     Plan: 1. Test/x-ray results and diagnosis reviewed with patient 2. rx as per orders; risks, benefits, potential side effects reviewed with patient 3. Recommend supportive treatment with ice and elevation as necessary to control swelling and pain.  Need to follow-up with the podiatrist for further care and treatment. 4. F/u prn if symptoms worsen or don't improve   Final Clinical Impressions(s) / UC Diagnoses   Final diagnoses:  Closed displaced fracture of proximal phalanx of lesser toe of left foot, initial encounter    ED Discharge Orders         Ordered    ibuprofen (ADVIL,MOTRIN) 800 MG tablet  3 times daily     11/13/17 0933       Controlled Substance Prescriptions  Controlled Substance Registry consulted? Not Applicable   Lutricia Feil, PA-C 11/13/17 0945

## 2017-11-13 NOTE — Discharge Instructions (Signed)
Elevate foot above your heart control swelling and pain.Apply ice 20 minutes out of every 2 hours 4-5 times daily for comfort.  Follow up with a podiatrist in the next several days

## 2017-11-13 NOTE — ED Triage Notes (Signed)
Patient states she stubbed her toe on the doorframe last night

## 2017-11-18 NOTE — Progress Notes (Signed)
   HPI: 52 year old female presents the office today for evaluation of an injury which occurred 1 day prior to presentation today.  Patient stubbed her toe on a door frame last night.  She went to the urgent care today and had x-rays taken.  She was treated with a immobilization boot and prescription for ibuprofen 800 mg.  She presents today for follow-up treatment evaluation  Past Medical History:  Diagnosis Date  . Anemia    iron infusions in past  . Arthritis    right knee (also torn meniscus)  . Childhood asthma   . Eczema   . Iritis    Followed by Dr. Marti Sleigh  . Motion sickness    back seat cars  . Prediabetes   . Urinary tract bacterial infections      Physical Exam: General: The patient is alert and oriented x3 in no acute distress.  Dermatology: Skin is warm, dry and supple bilateral lower extremities. Negative for open lesions or macerations.  Vascular: Palpable pedal pulses bilaterally. No edema or erythema noted. Capillary refill within normal limits.  Neurological: Epicritic and protective threshold grossly intact bilaterally.   Musculoskeletal Exam: Pain on palpation noted to the fourth digit right foot.  Range of motion within normal limits to all pedal and ankle joints bilateral. Muscle strength 5/5 in all groups bilateral.   Radiographic Exam:  X-rays taken at the urgent care today are consistent with a nondisplaced fracture proximal phalanx fourth toe right foot.  Assessment: 1.  Fracture fourth toe right foot proximal phalanx, nondisplaced, initial encounter   Plan of Care:  1. Patient evaluated. X-Rays reviewed.  2.  Continue weightbearing in the immobilization cam boot. 3.  Continue ibuprofen 800 mg as needed 4.  Return to clinic in 6 weeks for follow-up x-rays      Felecia Shelling, DPM [facility]  Dr. Felecia Shelling, DPM    [address] [place], [place] [address]                Office [phone]  Fax [phone]

## 2017-11-23 DIAGNOSIS — M722 Plantar fascial fibromatosis: Secondary | ICD-10-CM

## 2017-11-23 DIAGNOSIS — S92309A Fracture of unspecified metatarsal bone(s), unspecified foot, initial encounter for closed fracture: Secondary | ICD-10-CM | POA: Diagnosis not present

## 2017-12-08 ENCOUNTER — Other Ambulatory Visit (INDEPENDENT_AMBULATORY_CARE_PROVIDER_SITE_OTHER): Payer: Federal, State, Local not specified - PPO

## 2017-12-08 DIAGNOSIS — Z0000 Encounter for general adult medical examination without abnormal findings: Secondary | ICD-10-CM

## 2017-12-08 LAB — LIPID PANEL
Cholesterol: 161 mg/dL (ref 0–200)
HDL: 68.4 mg/dL (ref >39.00)
LDL Cholesterol: 84 mg/dL (ref 0–99)
NONHDL: 93.04
Total CHOL/HDL Ratio: 2
Triglycerides: 43 mg/dL (ref 0.0–149.0)
VLDL: 8.6 mg/dL (ref 0.0–40.0)

## 2017-12-08 LAB — COMPREHENSIVE METABOLIC PANEL
ALT: 13 U/L (ref 0–35)
AST: 15 U/L (ref 0–37)
Albumin: 3.9 g/dL (ref 3.5–5.2)
Alkaline Phosphatase: 74 U/L (ref 39–117)
BILIRUBIN TOTAL: 0.6 mg/dL (ref 0.2–1.2)
BUN: 18 mg/dL (ref 6–23)
CALCIUM: 9.2 mg/dL (ref 8.4–10.5)
CHLORIDE: 106 meq/L (ref 96–112)
CO2: 27 meq/L (ref 19–32)
CREATININE: 0.62 mg/dL (ref 0.40–1.20)
GFR: 129.68 mL/min (ref >60.00)
GLUCOSE: 101 mg/dL — AB (ref 70–99)
Potassium: 4 mEq/L (ref 3.5–5.1)
SODIUM: 141 meq/L (ref 135–145)
Total Protein: 7.3 g/dL (ref 6.0–8.3)

## 2017-12-08 LAB — CBC WITH DIFFERENTIAL/PLATELET
Basophils Absolute: 0 10*3/uL (ref 0.0–0.1)
Basophils Relative: 0.3 % (ref 0.0–3.0)
EOS ABS: 0.1 10*3/uL (ref 0.0–0.7)
Eosinophils Relative: 4 % (ref 0.0–5.0)
HEMATOCRIT: 38.2 % (ref 36.0–46.0)
Hemoglobin: 12.5 g/dL (ref 12.0–15.0)
LYMPHS ABS: 1.6 10*3/uL (ref 0.7–4.0)
LYMPHS PCT: 46 % (ref 12.0–46.0)
MCHC: 32.7 g/dL (ref 30.0–36.0)
MCV: 82.1 fl (ref 78.0–100.0)
MONOS PCT: 8.1 % (ref 3.0–12.0)
Monocytes Absolute: 0.3 10*3/uL (ref 0.1–1.0)
NEUTROS ABS: 1.4 10*3/uL (ref 1.4–7.7)
NEUTROS PCT: 41.6 % — AB (ref 43.0–77.0)
PLATELETS: 241 10*3/uL (ref 150.0–400.0)
RBC: 4.65 Mil/uL (ref 3.87–5.11)
RDW: 14.7 % (ref 11.5–15.5)
WBC: 3.4 10*3/uL — ABNORMAL LOW (ref 4.0–10.5)

## 2017-12-08 LAB — IBC PANEL
Iron: 49 ug/dL (ref 42–145)
Saturation Ratios: 17.1 % — ABNORMAL LOW (ref 20.0–50.0)
Transferrin: 205 mg/dL — ABNORMAL LOW (ref 212.0–360.0)

## 2017-12-08 LAB — FERRITIN: Ferritin: 91.5 ng/mL (ref 10.0–291.0)

## 2017-12-08 LAB — TSH: TSH: 1.84 u[IU]/mL (ref 0.35–4.50)

## 2017-12-08 LAB — VITAMIN D 25 HYDROXY (VIT D DEFICIENCY, FRACTURES): VITD: 19.37 ng/mL — AB (ref 30.00–100.00)

## 2017-12-08 LAB — HEMOGLOBIN A1C: HEMOGLOBIN A1C: 6 % (ref 4.6–6.5)

## 2017-12-11 ENCOUNTER — Ambulatory Visit (INDEPENDENT_AMBULATORY_CARE_PROVIDER_SITE_OTHER): Payer: Federal, State, Local not specified - PPO

## 2017-12-11 ENCOUNTER — Encounter: Payer: Self-pay | Admitting: Podiatry

## 2017-12-11 ENCOUNTER — Other Ambulatory Visit: Payer: Self-pay | Admitting: Internal Medicine

## 2017-12-11 ENCOUNTER — Ambulatory Visit: Payer: Federal, State, Local not specified - PPO | Admitting: Podiatry

## 2017-12-11 DIAGNOSIS — S92514A Nondisplaced fracture of proximal phalanx of right lesser toe(s), initial encounter for closed fracture: Secondary | ICD-10-CM | POA: Diagnosis not present

## 2017-12-11 DIAGNOSIS — S92514D Nondisplaced fracture of proximal phalanx of right lesser toe(s), subsequent encounter for fracture with routine healing: Secondary | ICD-10-CM

## 2017-12-11 DIAGNOSIS — E559 Vitamin D deficiency, unspecified: Secondary | ICD-10-CM

## 2017-12-11 MED ORDER — CHOLECALCIFEROL 1.25 MG (50000 UT) PO CAPS: 50000.0000 [IU] | ORAL_CAPSULE | ORAL | 1 refills | Status: DC

## 2017-12-14 NOTE — Progress Notes (Signed)
   HPI: 52 year old female presents the office today for follow up evaluation of a fractured right fourth toe. She states she is doing well. She reports mild soreness. She denies any new complaints. Patient is here for further evaluation and treatment.   Past Medical History:  Diagnosis Date  . Anemia    iron infusions in past  . Arthritis    right knee (also torn meniscus)  . Childhood asthma   . Eczema   . Iritis    Followed by Dr. Marti Sleigh  . Motion sickness    back seat cars  . Prediabetes   . Urinary tract bacterial infections      Physical Exam: General: The patient is alert and oriented x3 in no acute distress.  Dermatology: Skin is warm, dry and supple bilateral lower extremities. Negative for open lesions or macerations.  Vascular: Palpable pedal pulses bilaterally. No edema or erythema noted. Capillary refill within normal limits.  Neurological: Epicritic and protective threshold grossly intact bilaterally.   Musculoskeletal Exam: Pain on palpation noted to the fourth digit right foot.  Range of motion within normal limits to all pedal and ankle joints bilateral. Muscle strength 5/5 in all groups bilateral.   Radiographic Exam:  Nondisplaced fracture proximal phalanx fourth toe right foot with routine healing.   Assessment: 1.  Fracture fourth toe right foot proximal phalanx, nondisplaced with routine healing   Plan of Care:  1. Patient evaluated. X-Rays reviewed.  2. Resume wearing good shoe gear.  3. Continue taking Meloxicam as needed.  4. May resume full activity with no restriction beginning June 1st.  5. Return to clinic as needed.       Hannah Robinson, DPM [facility]  Dr. Felecia Robinson, DPM    [address] [place], [place] [address]                Office [phone]  Fax [phone]

## 2017-12-15 ENCOUNTER — Encounter: Payer: Federal, State, Local not specified - PPO | Admitting: Family

## 2017-12-16 ENCOUNTER — Encounter: Payer: Federal, State, Local not specified - PPO | Admitting: Family

## 2017-12-16 ENCOUNTER — Encounter: Payer: Federal, State, Local not specified - PPO | Admitting: Internal Medicine

## 2017-12-17 ENCOUNTER — Other Ambulatory Visit: Payer: Self-pay | Admitting: Internal Medicine

## 2017-12-17 DIAGNOSIS — D72819 Decreased white blood cell count, unspecified: Secondary | ICD-10-CM

## 2017-12-24 DIAGNOSIS — H02402 Unspecified ptosis of left eyelid: Secondary | ICD-10-CM | POA: Diagnosis not present

## 2017-12-24 DIAGNOSIS — H35353 Cystoid macular degeneration, bilateral: Secondary | ICD-10-CM | POA: Diagnosis not present

## 2017-12-24 DIAGNOSIS — H35351 Cystoid macular degeneration, right eye: Secondary | ICD-10-CM | POA: Diagnosis not present

## 2017-12-24 DIAGNOSIS — H2013 Chronic iridocyclitis, bilateral: Secondary | ICD-10-CM | POA: Diagnosis not present

## 2017-12-24 DIAGNOSIS — Z961 Presence of intraocular lens: Secondary | ICD-10-CM | POA: Diagnosis not present

## 2017-12-24 DIAGNOSIS — H4043X1 Glaucoma secondary to eye inflammation, bilateral, mild stage: Secondary | ICD-10-CM | POA: Diagnosis not present

## 2017-12-24 DIAGNOSIS — H35373 Puckering of macula, bilateral: Secondary | ICD-10-CM | POA: Diagnosis not present

## 2017-12-27 DIAGNOSIS — D72819 Decreased white blood cell count, unspecified: Secondary | ICD-10-CM | POA: Insufficient documentation

## 2017-12-27 NOTE — Progress Notes (Deleted)
[facility]  [phone] [phone]  ID: Hannah Robinson OB: 1966/07/06  MR#: [id number]  AOZ#:[id number]  Patient Care Team: McLean-Scocuzza, Pasty Spillers, MD as PCP - General (Internal Medicine)  CHIEF COMPLAINT: Leukopenia  INTERVAL HISTORY: ***  REVIEW OF SYSTEMS:   ROS  As per HPI. Otherwise, a complete review of systems is negative.  PAST MEDICAL HISTORY: Past Medical History:  Diagnosis Date  . Anemia    iron infusions in past  . Arthritis    right knee (also torn meniscus)  . Childhood asthma   . Eczema   . Iritis    Followed by Dr. Marti Sleigh  . Motion sickness    back seat cars  . Prediabetes   . Urinary tract bacterial infections     PAST SURGICAL HISTORY: Past Surgical History:  Procedure Laterality Date  . BREAST SURGERY  1988   benign  . CATARACT EXTRACTION W/ INTRAOCULAR LENS IMPLANT Left   . COLONOSCOPY WITH PROPOFOL N/A 03/05/2017   Procedure: COLONOSCOPY WITH PROPOFOL;  Surgeon: Midge Minium, MD;  Location: [facility];  Service: Endoscopy;  Laterality: N/A;  . VAGINAL DELIVERY     2    FAMILY HISTORY: Family History  Problem Relation Age of Onset  . Lung cancer Maternal Aunt   . Alcohol abuse Maternal Aunt   . Clotting disorder Mother   . Arthritis Mother   . Heart disease Mother   . Bipolar disorder Brother   . Stroke Maternal Grandmother   . Cancer Maternal Aunt        lung  . Asthma Son     ADVANCED DIRECTIVES (Y/N):  N  HEALTH MAINTENANCE: Social History   Tobacco Use  . Smoking status: Never Smoker  . Smokeless tobacco: Never Used  Substance Use Topics  . Alcohol use: Yes    Alcohol/week: 6.6 oz    Types: 4 Standard drinks or equivalent, 7 Glasses of wine per week    Comment: Wine  . Drug use: No     Colonoscopy:  PAP:  Bone density:  Lipid panel:  Allergies  Allergen Reactions  . Other Hives    Allergic to tree nuts, but not to peanuts  . Penicillins Hives  .  Amoxicillin Rash    Current Outpatient Medications  Medication Sig Dispense Refill  . albuterol (PROAIR HFA) 108 (90 Base) MCG/ACT inhaler Inhale 2 puffs into the lungs every 4 (four) hours as needed. 8.5 Inhaler 11  . BREO ELLIPTA 100-25 MCG/INH AEPB TAKE 1 PUFF BY MOUTH EVERY DAY 60 each 2  . cetirizine (ZYRTEC) 10 MG tablet Take 1 tablet (10 mg total) by mouth daily. 30 tablet 11  . Cholecalciferol (VITAMIN D PO) Take by mouth.    . Cholecalciferol 50000 units capsule Take 1 capsule (50,000 Units total) by mouth once a week. 13 capsule 1  . DUREZOL 0.05 % EMUL PLACE 1 DROP INTO THE RIGHT EYE ONCE DAILY.  6  . fluticasone (FLONASE) 50 MCG/ACT nasal spray Place 2 sprays into both nostrils daily. 16 g 8  . ibuprofen (ADVIL,MOTRIN) 800 MG tablet Take 1 tablet (800 mg total) by mouth 3 (three) times daily. 90 tablet 0  . ketorolac (ACULAR) 0.5 % ophthalmic solution PLACE 1 DROP INTO THE RIGHT EYE 4 (FOUR) TIMES DAILY.  2  . omeprazole (PRILOSEC) 20 MG capsule TAKE 1 CAPSULE BY MOUTH EVERY DAY 90 capsule 1  . prednisoLONE 5 MG TABS tablet Take 2.5 mg by mouth.  No current facility-administered medications for this visit.     OBJECTIVE: There were no vitals filed for this visit.   There is no height or weight on file to calculate BMI.    ECOG FS:{CHL ONC [id number]}  General: Well-developed, well-nourished, no acute distress. Eyes: Pink conjunctiva, anicteric sclera. HEENT: Normocephalic, moist mucous membranes, clear oropharnyx. Lungs: Clear to auscultation bilaterally. Heart: Regular rate and rhythm. No rubs, murmurs, or gallops. Abdomen: Soft, nontender, nondistended. No organomegaly noted, normoactive bowel sounds. Musculoskeletal: No edema, cyanosis, or clubbing. Neuro: Alert, answering all questions appropriately. Cranial nerves grossly intact. Skin: No rashes or petechiae noted. Psych: Normal affect. Lymphatics: No cervical, calvicular, axillary or inguinal LAD.   LAB  RESULTS:  Lab Results  Component Value Date   NA 141 12/08/2017   K 4.0 12/08/2017   CL 106 12/08/2017   CO2 27 12/08/2017   GLUCOSE 101 (H) 12/08/2017   BUN 18 12/08/2017   CREATININE 0.62 12/08/2017   CALCIUM 9.2 12/08/2017   PROT 7.3 12/08/2017   ALBUMIN 3.9 12/08/2017   AST 15 12/08/2017   ALT 13 12/08/2017   ALKPHOS 74 12/08/2017   BILITOT 0.6 12/08/2017    Lab Results  Component Value Date   WBC 3.4 (L) 12/08/2017   NEUTROABS 1.4 12/08/2017   HGB 12.5 12/08/2017   HCT 38.2 12/08/2017   MCV 82.1 12/08/2017   PLT 241.0 12/08/2017     STUDIES: Dg Foot Complete Right  Result Date: 12/11/2017 Please see detailed radiograph report in office note.   ASSESSMENT: Leukopenia  PLAN:    1. Leukopenia:  Patient expressed understanding and was in agreement with this plan. She also understands that She can call clinic at any time with any questions, concerns, or complaints.   Cancer Staging No matching staging information was found for the patient.  Jeralyn Ruthsimothy J Legna Mausolf, MD   12/27/2017 11:56 PM

## 2018-01-01 ENCOUNTER — Encounter: Payer: Federal, State, Local not specified - PPO | Admitting: Oncology

## 2018-01-14 ENCOUNTER — Other Ambulatory Visit: Payer: Self-pay | Admitting: Family

## 2018-01-14 DIAGNOSIS — L301 Dyshidrosis [pompholyx]: Secondary | ICD-10-CM

## 2018-01-15 NOTE — Telephone Encounter (Signed)
Last filled 07/06/17 Nov sched 02/04/18

## 2018-01-18 ENCOUNTER — Other Ambulatory Visit: Payer: Self-pay | Admitting: Family

## 2018-01-19 ENCOUNTER — Telehealth: Payer: Self-pay | Admitting: Internal Medicine

## 2018-01-19 NOTE — Telephone Encounter (Signed)
I dont see this medication on her med list. Please advise

## 2018-01-19 NOTE — Telephone Encounter (Signed)
Copied from CRM [id number]. Topic: Quick Communication - Rx Refill/Question >> Jan 19, 2018  1:08 PM Leafy Roobinson, Norma J wrote: Medication: nystop powder Has the patient contacted their pharmacy? Yes pharm is calling Preferred Pharmacy (with phone number or street name):[facility] [place] Agent: Please be advised that RX refills may take up to 3 business days. We ask that you follow-up with your pharmacy.

## 2018-01-20 ENCOUNTER — Other Ambulatory Visit: Payer: Self-pay

## 2018-01-20 ENCOUNTER — Other Ambulatory Visit: Payer: Self-pay | Admitting: Internal Medicine

## 2018-01-20 DIAGNOSIS — L304 Erythema intertrigo: Secondary | ICD-10-CM

## 2018-01-20 MED ORDER — ALBUTEROL SULFATE HFA 108 (90 BASE) MCG/ACT IN AERS: 2.0000 | INHALATION_SPRAY | RESPIRATORY_TRACT | 11 refills | Status: DC | PRN

## 2018-01-20 MED ORDER — NYSTATIN 100000 UNIT/GM EX POWD: Freq: Three times a day (TID) | CUTANEOUS | 11 refills | Status: DC

## 2018-01-20 NOTE — Telephone Encounter (Signed)
Please advise 

## 2018-01-20 NOTE — Telephone Encounter (Signed)
Patient has been informed.

## 2018-01-20 NOTE — Telephone Encounter (Signed)
She may also want to try Zeasorb AF Powder over the counter instead if this does not help   TMS

## 2018-02-04 ENCOUNTER — Ambulatory Visit (INDEPENDENT_AMBULATORY_CARE_PROVIDER_SITE_OTHER): Payer: Federal, State, Local not specified - PPO | Admitting: Internal Medicine

## 2018-02-04 ENCOUNTER — Encounter: Payer: Self-pay | Admitting: Internal Medicine

## 2018-02-04 ENCOUNTER — Other Ambulatory Visit (HOSPITAL_COMMUNITY)
Admission: RE | Admit: 2018-02-04 | Discharge: 2018-02-04 | Disposition: A | Discharge status: Home / Self Care | Payer: Federal, State, Local not specified - PPO | Source: Ambulatory Visit | Attending: Internal Medicine | Admitting: Internal Medicine

## 2018-02-04 VITALS — BP 138/70 | HR 71 | Temp 98.6°F | Ht 66.0 in | Wt 255.2 lb

## 2018-02-04 DIAGNOSIS — R011 Cardiac murmur, unspecified: Secondary | ICD-10-CM

## 2018-02-04 DIAGNOSIS — L304 Erythema intertrigo: Secondary | ICD-10-CM

## 2018-02-04 DIAGNOSIS — Z113 Encounter for screening for infections with a predominantly sexual mode of transmission: Secondary | ICD-10-CM | POA: Diagnosis not present

## 2018-02-04 DIAGNOSIS — R300 Dysuria: Secondary | ICD-10-CM

## 2018-02-04 DIAGNOSIS — Z1159 Encounter for screening for other viral diseases: Secondary | ICD-10-CM | POA: Insufficient documentation

## 2018-02-04 DIAGNOSIS — Z1231 Encounter for screening mammogram for malignant neoplasm of breast: Secondary | ICD-10-CM

## 2018-02-04 DIAGNOSIS — Z0001 Encounter for general adult medical examination with abnormal findings: Secondary | ICD-10-CM

## 2018-02-04 DIAGNOSIS — Z0184 Encounter for antibody response examination: Secondary | ICD-10-CM

## 2018-02-04 DIAGNOSIS — E559 Vitamin D deficiency, unspecified: Secondary | ICD-10-CM | POA: Diagnosis not present

## 2018-02-04 DIAGNOSIS — D72819 Decreased white blood cell count, unspecified: Secondary | ICD-10-CM

## 2018-02-04 DIAGNOSIS — Z13818 Encounter for screening for other digestive system disorders: Secondary | ICD-10-CM | POA: Diagnosis not present

## 2018-02-04 MED ORDER — CLOTRIMAZOLE 1 % EX CREA: 1.0000 "application " | TOPICAL_CREAM | Freq: Two times a day (BID) | CUTANEOUS | 11 refills | Status: DC

## 2018-02-04 NOTE — Patient Instructions (Addendum)
Zevia soda try  D3 take weekly x 6 months then daily 5000 IU D3 over the counter starting month 7  Follow up with [facility] blood specialist Dr. Orlie Dakin or [place] [phone]  Try Clotrimazole 1% 2x per day to skin folds and hydrocortisone 1% over the counter 2x per day  Try Zeasorb AF Powder in skin folds instead of Nystatin powder  Use Dove bar soap unscented or white Use Cetaphil lotion or cream for moisturizing  All Free and Clear laundry detergent for sensitive skin  We will schedule echo of your heart  F/u in 3-4 months    Heart Murmur A heart murmur is an extra sound that is caused by chaotic blood flow. The murmur can be heard as a "hum" or "whoosh" sound when blood flows through the heart. The heart has four areas called chambers. Valves separate the upper and lower chambers from each other (tricuspid valve and mitral valve) and separate the lower chambers of the heart from pathways that lead away from the heart (aortic valve and pulmonary valve). Normally, the valves open to let blood flow through or out of your heart, and then they shut to keep the blood from flowing backward. There are two types of heart murmurs:  Innocent murmurs. Most people with this type of heart murmur do not have a heart problem. Many children have innocent heart murmurs. Your health care provider may suggest some basic testing to find out whether your murmur is an innocent murmur. If an innocent heart murmur is found, there is no need for further tests or treatment and no need to restrict activities or stop playing sports.  Abnormal murmurs. These types of murmurs can occur in children and adults. Abnormal murmurs may be a sign of a more serious heart condition, such as a heart defect present at birth (congenital defect) or heart valve disease.  What are the causes? This condition is caused by heart valves that are not working properly. In children, abnormal heart murmurs are typically caused by congenital  defects. In adults, abnormal murmurs are usually from heart valve problems caused by disease, infection, or aging. Three types of heart valve defects can cause a murmur:  Regurgitation. This is when blood leaks back through the valve in the wrong direction.  Mitral valve prolapse. This is when the mitral valve of the heart has a loose flap and does not close tightly.  Stenosis. This is when a valve does not open enough and blocks blood flow.  This condition may also be caused by:  Pregnancy.  Fever.  Overactive thyroid gland.  Anemia.  Exercise.  Rapid growth spurts (in children).  What are the signs or symptoms? Innocent murmurs do not cause symptoms, and many people with abnormal murmurs may or may not have symptoms. If symptoms do develop, they may include:  Shortness of breath.  Blue coloring of the skin, especially on the fingertips.  Chest pain.  Palpitations, or feeling a fluttering or skipped heartbeat.  Fainting.  Persistent cough.  Getting tired much faster than expected.  Swelling in the abdomen, feet, or ankles.  How is this diagnosed? This condition may be diagnosed during a routine physical or other exam. If your health care provider hears a murmur with a stethoscope, he or she will listen for:  Where the murmur is located in your heart.  How long the murmur lasts (duration).  When the murmur is heard during the heartbeat.  How loud the murmur is. This may help  the health care provider figure out what is causing the murmur.  You may be referred to a heart specialist (cardiologist). You may also have other tests, including:  Electrocardiogram (ECG or EKG). This test measures the electrical activity of your heart.  Echocardiogram. This test uses high frequency sound waves to make pictures of your heart.  MRI or chest X-ray.  Cardiac catheterization. This test looks at blood flow through the heart.  For children and adults who have an abnormal  heart murmur and want to stay active, it is important to complete testing, review test results, and receive recommendations from your health care provider. If heart disease is present, it may not be safe to play or be active. How is this treated? Heart murmurs themselves do not need treatment. In some cases, a heart murmur may go away on its own. If an underlying problem or disease is causing the murmur, you may need treatment. If treatment is needed, it will depend on the type and severity of the disease or heart problem causing the murmur. Treatment may include:  Medicine.  Surgery.  Dietary and lifestyle changes.  Follow these instructions at home:  Talk with your health care provider before participating in sports or other activities that require a lot of effort and energy (are strenuous).  Learn as much as possible about your condition and any related diseases. Ask your health care provider if you may at risk for any medical emergencies.  Talk with your health care provider about what symptoms you should look out for.  It is up to you to get your test results. Ask your health care provider, or the department that is doing the test, when your results will be ready.  Keep all follow-up visits as told by your health care provider. This is important. Contact a health care provider if:  You feel light-headed.  You are frequently short of breath.  You feel more tired than usual.  You are having a hard time keeping up with normal activities or fitness routines.  You have swelling in your ankles or feet.  You have chest pain.  You notice that your heart often beats irregularly.  You develop any new symptoms. Get help right away if:  You develop severe chest pain.  You are having trouble breathing.  You have fainting spells.  Your symptoms suddenly get worse. These symptoms may represent a serious problem that is an emergency. Do not wait to see if the symptoms will go  away. Get medical help right away. Call your local emergency services (911 in the [place] Do not drive yourself to the hospital. Summary  Normally, the heart valves open to let blood flow through or out of your heart, and then they shut to keep the blood from flowing backward.  Heart murmur is caused by heart valves that are not working properly.  You may need treatment if an underlying problem or disease is causing the heart murmur. Treatment may include medicine, surgery, or dietary and lifestyle changes.  Talk with your health care provider before participating in sports or other activities that require a lot of effort and energy (are strenuous).  Talk with your health care provider about what symptoms you should watch out for. This information is not intended to replace advice given to you by your health care provider. Make sure you discuss any questions you have with your health care provider. Document Released: 08/14/2004 Document Revised: 06/25/2016 Document Reviewed: 06/25/2016 Elsevier Interactive Patient Education  2018  [employer] Inc[employer]  Intertrigo Intertrigo is skin irritation or inflammation (dermatitis) that occurs when folds of skin rub together. The irritation can cause a rash and make skin raw and itchy. This condition most commonly occurs in the skin folds of these areas:  Toes.  Armpits.  Groin.  Belly.  Breasts.  Buttocks.  Intertrigo is not passed from person to person (is not contagious). What are the causes? This condition is caused by heat, moisture, friction, and lack of air circulation. The condition can be made worse by:  Sweat.  Bacteria or a fungus, such as yeast.  What increases the risk? This condition is more likely to occur if you have moisture in your skin folds. It is also more likely to develop in people who:  Have diabetes.  Are overweight.  Are on bed rest.  Live in a warm and moist climate.  Wear splints, braces, or other medical  devices.  Are not able to control their bowels or bladder (have incontinence).  What are the signs or symptoms? Symptoms of this condition include:  A pink or red skin rash.  Brown patches on the skin.  Raw or scaly skin.  Itchiness.  A burning feeling.  Bleeding.  Leaking fluid.  A bad smell.  How is this diagnosed? This condition is diagnosed with a medical history and physical exam. You may also have a skin swab to test for bacteria or a fungus, such as yeast. How is this treated? Treatment may include:  Cleaning and drying your skin.  An oral antibiotic medicine or antibiotic skin cream for a bacterial infection.  Antifungal cream or pills for an infection that was caused by a fungus, such as yeast.  Steroid ointment to relieve itchiness and irritation.  Follow these instructions at home:  Keep the affected area clean and dry.  Do not scratch your skin.  Stay in a cool environment as much as possible. Use an air conditioner or fan, if available.  Apply over-the-counter and prescription medicines only as told by your health care provider.  If you were prescribed an antibiotic medicine, use it as told by your health care provider. Do not stop using the antibiotic even if your condition improves.  Keep all follow-up visits as told by your health care provider. This is important. How is this prevented?  Maintain a healthy weight.  Take care of your feet, especially if you have diabetes. Foot care includes: ? Wearing shoes that fit well. ? Keeping your feet dry. ? Wearing clean, breathable socks.  Protect the skin around your groin and buttocks, especially if you have incontinence. Skin protection includes: ? Following a regular cleaning routine. ? Using moisturizers and skin protectants. ? Changing protection pads frequently.  Do not wear tight clothes. Wear clothes that are loose and absorbent. Wear clothes that are made of cotton.  Wear a bra that  gives good support, if needed.  Shower and dry yourself thoroughly after activity. Use a hair dryer on a cool setting to dry between skin folds, especially after you bathe.  If you have diabetes, keep your blood sugar under control. Contact a health care provider if:  Your symptoms do not improve with treatment.  Your symptoms get worse or they spread.  You notice increased redness and warmth.  You have a fever. This information is not intended to replace advice given to you by your health care provider. Make sure you discuss any questions you have with your health care provider. Document Released:  07/07/2005 Document Revised: 12/13/2015 Document Reviewed: 01/08/2015 Elsevier Interactive Patient Education  2018 [employer]   Mediterranean Diet A Mediterranean diet refers to food and lifestyle choices that are based on the traditions of countries located on the [place]. This way of eating has been shown to help prevent certain conditions and improve outcomes for people who have chronic diseases, like kidney disease and heart disease. What are tips for following this plan? Lifestyle  Cook and eat meals together with your family, when possible.  Drink enough fluid to keep your urine clear or pale yellow.  Be physically active every day. This includes: ? Aerobic exercise like running or swimming. ? Leisure activities like gardening, walking, or housework.  Get 7-8 hours of sleep each night.  If recommended by your health care provider, drink red wine in moderation. This means 1 glass a day for nonpregnant women and 2 glasses a day for men. A glass of wine equals 5 oz (150 mL). Reading food labels  Check the serving size of packaged foods. For foods such as rice and pasta, the serving size refers to the amount of cooked product, not dry.  Check the total fat in packaged foods. Avoid foods that have saturated fat or trans fats.  Check the ingredients list for added  sugars, such as corn syrup. Shopping  At the grocery store, buy most of your food from the areas near the walls of the store. This includes: ? Fresh fruits and vegetables (produce). ? Grains, beans, nuts, and seeds. Some of these may be available in unpackaged forms or large amounts (in bulk). ? Fresh seafood. ? Poultry and eggs. ? Low-fat dairy products.  Buy whole ingredients instead of prepackaged foods.  Buy fresh fruits and vegetables in-season from local farmers markets.  Buy frozen fruits and vegetables in resealable bags.  If you do not have access to quality fresh seafood, buy precooked frozen shrimp or canned fish, such as tuna, salmon, or sardines.  Buy small amounts of raw or cooked vegetables, salads, or olives from the deli or salad bar at your store.  Stock your pantry so you always have certain foods on hand, such as olive oil, canned tuna, canned tomatoes, rice, pasta, and beans. Cooking  Cook foods with extra-virgin olive oil instead of using butter or other vegetable oils.  Have meat as a side dish, and have vegetables or grains as your main dish. This means having meat in small portions or adding small amounts of meat to foods like pasta or stew.  Use beans or vegetables instead of meat in common dishes like chili or lasagna.  Experiment with different cooking methods. Try roasting or broiling vegetables instead of steaming or sauteing them.  Add frozen vegetables to soups, stews, pasta, or rice.  Add nuts or seeds for added healthy fat at each meal. You can add these to yogurt, salads, or vegetable dishes.  Marinate fish or vegetables using olive oil, lemon juice, garlic, and fresh herbs. Meal planning  Plan to eat 1 vegetarian meal one day each week. Try to work up to 2 vegetarian meals, if possible.  Eat seafood 2 or more times a week.  Have healthy snacks readily available, such as: ? Vegetable sticks with hummus. ? [place] yogurt. ? Fruit and nut  trail mix.  Eat balanced meals throughout the week. This includes: ? Fruit: 2-3 servings a day ? Vegetables: 4-5 servings a day ? Low-fat dairy: 2 servings a day ? Fish, poultry, or  lean meat: 1 serving a day ? Beans and legumes: 2 or more servings a week ? Nuts and seeds: 1-2 servings a day ? Whole grains: 6-8 servings a day ? Extra-virgin olive oil: 3-4 servings a day  Limit red meat and sweets to only a few servings a month What are my food choices?  Mediterranean diet ? Recommended ? Grains: Whole-grain pasta. Brown rice. Bulgar wheat. Polenta. Couscous. Whole-wheat bread. Orpah Cobb. ? Vegetables: Artichokes. Beets. Broccoli. Cabbage. Carrots. Eggplant. Green beans. Chard. Kale. Spinach. Onions. Leeks. Peas. Squash. Tomatoes. Peppers. Radishes. ? Fruits: Apples. Apricots. Avocado. Berries. Bananas. Cherries. Dates. Figs. Grapes. Lemons. Melon. Oranges. Peaches. Plums. Pomegranate. ? Meats and other protein foods: Beans. Almonds. Sunflower seeds. Pine nuts. Peanuts. Cod. Salmon. Scallops. Shrimp. Tuna. Tilapia. Clams. Oysters. Eggs. ? Dairy: Low-fat milk. Cheese. Greek yogurt. ? Beverages: Water. Red wine. Herbal tea. ? Fats and oils: Extra virgin olive oil. Avocado oil. Grape seed oil. ? Sweets and desserts: [place] yogurt with honey. Baked apples. Poached pears. Trail mix. ? Seasoning and other foods: Basil. Cilantro. Coriander. Cumin. Mint. Parsley. Sage. Rosemary. Tarragon. Garlic. Oregano. Thyme. Pepper. Balsalmic vinegar. Tahini. Hummus. Tomato sauce. Olives. Mushrooms. ? Limit these ? Grains: Prepackaged pasta or rice dishes. Prepackaged cereal with added sugar. ? Vegetables: Deep fried potatoes (french fries). ? Fruits: Fruit canned in syrup. ? Meats and other protein foods: Beef. Pork. Lamb. Poultry with skin. Hot dogs. Tomasa Blase. ? Dairy: Ice cream. Sour cream. Whole milk. ? Beverages: Juice. Sugar-sweetened soft drinks. Beer. Liquor and spirits. ? Fats and oils: Butter.  Canola oil. Vegetable oil. Beef fat (tallow). Lard. ? Sweets and desserts: Cookies. Cakes. Pies. Candy. ? Seasoning and other foods: Mayonnaise. Premade sauces and marinades. ? The items listed may not be a complete list. Talk with your dietitian about what dietary choices are right for you. Summary  The Mediterranean diet includes both food and lifestyle choices.  Eat a variety of fresh fruits and vegetables, beans, nuts, seeds, and whole grains.  Limit the amount of red meat and sweets that you eat.  Talk with your health care provider about whether it is safe for you to drink red wine in moderation. This means 1 glass a day for nonpregnant women and 2 glasses a day for men. A glass of wine equals 5 oz (150 mL). This information is not intended to replace advice given to you by your health care provider. Make sure you discuss any questions you have with your health care provider. Document Released: 02/28/2016 Document Revised: 04/01/2016 Document Reviewed: 02/28/2016 Elsevier Interactive Patient Education  2018 [employer].    Exercising to [employer] Exercising can help you to lose weight. In order to lose weight through exercise, you need to do vigorous-intensity exercise. You can tell that you are exercising with vigorous intensity if you are breathing very hard and fast and cannot hold a conversation while exercising. Moderate-intensity exercise helps to maintain your current weight. You can tell that you are exercising at a moderate level if you have a higher heart rate and faster breathing, but you are still able to hold a conversation. How often should I exercise? Choose an activity that you enjoy and set realistic goals. Your health care provider can help you to make an activity plan that works for you. Exercise regularly as directed by your health care provider. This may include:  Doing resistance training twice each week, such as: ? Push-ups. ? Sit-ups. ? Lifting  weights. ? Using resistance bands.  Doing a given intensity of exercise for a given amount of time. Choose from these options: ? 150 minutes of moderate-intensity exercise every week. ? 75 minutes of vigorous-intensity exercise every week. ? A mix of moderate-intensity and vigorous-intensity exercise every week.  Children, pregnant women, people who are out of shape, people who are overweight, and older adults may need to consult a health care provider for individual recommendations. If you have any sort of medical condition, be sure to consult your health care provider before starting a new exercise program. What are some activities that can help me to lose weight?  Walking at a rate of at least 4.5 miles an hour.  Jogging or running at a rate of 5 miles per hour.  Biking at a rate of at least 10 miles per hour.  Lap swimming.  Roller-skating or in-line skating.  Cross-country skiing.  Vigorous competitive sports, such as football, basketball, and soccer.  Jumping rope.  Aerobic dancing. How can I be more active in my day-to-day activities?  Use the stairs instead of the elevator.  Take a walk during your lunch break.  If you drive, park your car farther away from work or school.  If you take public transportation, get off one stop early and walk the rest of the way.  Make all of your phone calls while standing up and walking around.  Get up, stretch, and walk around every 30 minutes throughout the day. What guidelines should I follow while exercising?  Do not exercise so much that you hurt yourself, feel dizzy, or get very short of breath.  Consult your health care provider prior to starting a new exercise program.  Wear comfortable clothes and shoes with good support.  Drink plenty of water while you exercise to prevent dehydration or heat stroke. Body water is lost during exercise and must be replaced.  Work out until you breathe faster and your heart beats  faster. This information is not intended to replace advice given to you by your health care provider. Make sure you discuss any questions you have with your health care provider. Document Released: 08/09/2010 Document Revised: 12/13/2015 Document Reviewed: 12/08/2013 Elsevier Interactive Patient Education  [employer].

## 2018-02-04 NOTE — Progress Notes (Signed)
Pre visit review using our clinic review tool, if applicable. No additional management support is needed unless otherwise documented below in the visit note. 

## 2018-02-05 ENCOUNTER — Encounter: Payer: Self-pay | Admitting: Internal Medicine

## 2018-02-05 ENCOUNTER — Telehealth: Payer: Self-pay | Admitting: Internal Medicine

## 2018-02-05 DIAGNOSIS — L304 Erythema intertrigo: Secondary | ICD-10-CM | POA: Insufficient documentation

## 2018-02-05 DIAGNOSIS — Z0000 Encounter for general adult medical examination without abnormal findings: Secondary | ICD-10-CM | POA: Insufficient documentation

## 2018-02-05 DIAGNOSIS — Z0001 Encounter for general adult medical examination with abnormal findings: Principal | ICD-10-CM

## 2018-02-05 LAB — HEPATITIS B SURFACE ANTIBODY, QUANTITATIVE: Hepatitis B-Post: <5 m[IU]/mL — ABNORMAL LOW (ref >=10)

## 2018-02-05 LAB — HIV ANTIBODY (ROUTINE TESTING W REFLEX): HIV: NONREACTIVE

## 2018-02-05 LAB — URINALYSIS, ROUTINE W REFLEX MICROSCOPIC
BILIRUBIN UA: NEGATIVE
GLUCOSE, UA: NEGATIVE
Ketones, UA: NEGATIVE
Leukocytes, UA: NEGATIVE
NITRITE UA: NEGATIVE
PH UA: 5.5 (ref 5.0–7.5)
PROTEIN UA: NEGATIVE
RBC UA: NEGATIVE
Specific Gravity, UA: 1.026 (ref 1.005–1.030)
UUROB: 0.2 mg/dL (ref 0.2–1.0)

## 2018-02-05 LAB — MEASLES/MUMPS/RUBELLA IMMUNITY
RUBELLA: 8.81 {index}
Rubeola IgG: >300 AU/mL

## 2018-02-05 LAB — HSV 1 ANTIBODY, IGG: HSV 1 GLYCOPROTEIN G AB, IGG: 10 {index} — AB

## 2018-02-05 LAB — HEPATITIS C ANTIBODY
Hepatitis C Ab: NONREACTIVE
SIGNAL TO CUT-OFF: 0.02 (ref <1.00)

## 2018-02-05 LAB — HSV 2 ANTIBODY, IGG: HSV 2 Glycoprotein G Ab, IgG: <0.9 index

## 2018-02-05 NOTE — Progress Notes (Signed)
Chief Complaint  Patient presents with  . Annual Exam   Annual physical  1. Reviewed labs h/o leukopenia has not sch H/o appt yet given # referral made. Also prediabetes and vit D def.  2. C/o intertrigo using nystatin powder she does deliver mail so sweats a lot and skin under breasts darker in color 3. mammo due 02/25/18  4. Obesity has gained 6 lbs she likes mt dew has tried to cut back disc zevia soda  5. She wants urine checked after sex last Wednesday had some discomfort with urination also agreeable to sTD check   Review of Systems  Constitutional: Negative for weight loss.  HENT: Negative for hearing loss.   Eyes: Negative for blurred vision.  Respiratory: Negative for shortness of breath.   Cardiovascular: Negative for chest pain.  Gastrointestinal: Negative for abdominal pain.  Genitourinary: Positive for dysuria.  Musculoskeletal: Negative for falls.  Skin: Positive for itching and rash.  Neurological: Negative for headaches.  Psychiatric/Behavioral: Negative for depression and memory loss.   Past Medical History:  Diagnosis Date  . Anemia    iron infusions in past  . Arthritis    right knee (also torn meniscus)  . Childhood asthma   . Eczema   . Iritis    Followed by Dr. Joya San  . Motion sickness    back seat cars  . Prediabetes   . Urinary tract bacterial infections    Past Surgical History:  Procedure Laterality Date  . BREAST SURGERY  1988   benign  . CATARACT EXTRACTION W/ INTRAOCULAR LENS IMPLANT Left   . COLONOSCOPY WITH PROPOFOL N/A 03/05/2017   Procedure: COLONOSCOPY WITH PROPOFOL;  Surgeon: Lucilla Lame, MD;  Location: [facility];  Service: Endoscopy;  Laterality: N/A;  . VAGINAL DELIVERY     2   Family History  Problem Relation Age of Onset  . Lung cancer Maternal Aunt   . Alcohol abuse Maternal Aunt   . Clotting disorder Mother   . Arthritis Mother   . Heart disease Mother   . Bipolar disorder Brother   . Stroke Maternal  Grandmother   . Cancer Maternal Aunt        lung  . Asthma Son    Social History   Socioeconomic History  . Marital status: Single    Spouse name: Not on file  . Number of children: 2  . Years of education: Not on file  . Highest education level: Not on file  Occupational History  . Occupation: Mail carrier  Social Needs  . Financial resource strain: Not on file  . Food insecurity:    Worry: Not on file    Inability: Not on file  . Transportation needs:    Medical: Not on file    Non-medical: Not on file  Tobacco Use  . Smoking status: Never Smoker  . Smokeless tobacco: Never Used  Substance and Sexual Activity  . Alcohol use: Yes    Alcohol/week: 6.6 oz    Types: 4 Standard drinks or equivalent, 7 Glasses of wine per week    Comment: Wine  . Drug use: No  . Sexual activity: Not on file  Lifestyle  . Physical activity:    Days per week: Not on file    Minutes per session: Not on file  . Stress: Not on file  Relationships  . Social connections:    Talks on phone: Not on file    Gets together: Not on file    Attends  religious service: Not on file    Active member of club or organization: Not on file    Attends meetings of clubs or organizations: Not on file    Relationship status: Not on file  . Intimate partner violence:    Fear of current or ex partner: Not on file    Emotionally abused: Not on file    Physically abused: Not on file    Forced sexual activity: Not on file  Other Topics Concern  . Not on file  Social History Narrative   Lives in [place] with sons, from [place].      Work - Facilities manager, Marine scientist   Diet - regular   Exercise - walks with work   Current Meds  Medication Sig  . ferrous sulfate 325 (65 FE) MG tablet Take 325 mg by mouth daily with breakfast.   Allergies  Allergen Reactions  . Other Hives    Allergic to tree nuts, but not to peanuts  . Penicillins Hives  . Amoxicillin Rash   Recent Results (from the past 2160 hour(s))    VITAMIN D 25 Hydroxy (Vit-D Deficiency, Fractures)     Status: Abnormal   Collection Time: 12/08/17  8:35 AM  Result Value Ref Range   VITD 19.37 (L) 30.00 - 100.00 ng/mL  TSH     Status: None   Collection Time: 12/08/17  8:35 AM  Result Value Ref Range   TSH 1.84 0.35 - 4.50 uIU/mL  Lipid panel     Status: None   Collection Time: 12/08/17  8:35 AM  Result Value Ref Range   Cholesterol 161 0 - 200 mg/dL    Comment: ATP III Classification       Desirable:  < 200 mg/dL               Borderline High:  200 - 239 mg/dL          High:  > = 240 mg/dL   Triglycerides 43.0 0.0 - 149.0 mg/dL    Comment: Normal:  <150 mg/dLBorderline High:  150 - 199 mg/dL   HDL 68.40 >39.00 mg/dL   VLDL 8.6 0.0 - 40.0 mg/dL   LDL Cholesterol 84 0 - 99 mg/dL   Total CHOL/HDL Ratio 2     Comment:                Men          Women1/2 Average Risk     3.4          3.3Average Risk          5.0          4.42X Average Risk          9.6          7.13X Average Risk          15.0          11.0                       NonHDL 93.04     Comment: NOTE:  Non-HDL goal should be 30 mg/dL higher than patient's LDL goal (i.e. LDL goal of < 70 mg/dL, would have non-HDL goal of < 100 mg/dL)  Hemoglobin A1c     Status: None   Collection Time: 12/08/17  8:35 AM  Result Value Ref Range   Hgb A1c MFr Bld 6.0 4.6 - 6.5 %    Comment: Glycemic Control Guidelines for People  with Diabetes:Non Diabetic:  <6%Goal of Therapy: <7%Additional Action Suggested:  >8%   Comprehensive metabolic panel     Status: Abnormal   Collection Time: 12/08/17  8:35 AM  Result Value Ref Range   Sodium 141 135 - 145 mEq/L   Potassium 4.0 3.5 - 5.1 mEq/L   Chloride 106 96 - 112 mEq/L   CO2 27 19 - 32 mEq/L   Glucose, Bld 101 (H) 70 - 99 mg/dL   BUN 18 6 - 23 mg/dL   Creatinine, Ser 0.62 0.40 - 1.20 mg/dL   Total Bilirubin 0.6 0.2 - 1.2 mg/dL   Alkaline Phosphatase 74 39 - 117 U/L   AST 15 0 - 37 U/L   ALT 13 0 - 35 U/L   Total Protein 7.3 6.0 - 8.3 g/dL    Albumin 3.9 3.5 - 5.2 g/dL   Calcium 9.2 8.4 - 10.5 mg/dL   GFR 129.68 >60.00 mL/min  CBC with Differential/Platelet     Status: Abnormal   Collection Time: 12/08/17  8:35 AM  Result Value Ref Range   WBC 3.4 (L) 4.0 - 10.5 K/uL   RBC 4.65 3.87 - 5.11 Mil/uL   Hemoglobin 12.5 12.0 - 15.0 g/dL   HCT 38.2 36.0 - 46.0 %   MCV 82.1 78.0 - 100.0 fl   MCHC 32.7 30.0 - 36.0 g/dL   RDW 14.7 11.5 - 15.5 %   Platelets 241.0 150.0 - 400.0 K/uL   Neutrophils Relative % 41.6 (L) 43.0 - 77.0 %   Lymphocytes Relative 46.0 12.0 - 46.0 %   Monocytes Relative 8.1 3.0 - 12.0 %   Eosinophils Relative 4.0 0.0 - 5.0 %   Basophils Relative 0.3 0.0 - 3.0 %   Neutro Abs 1.4 1.4 - 7.7 K/uL   Lymphs Abs 1.6 0.7 - 4.0 K/uL   Monocytes Absolute 0.3 0.1 - 1.0 K/uL   Eosinophils Absolute 0.1 0.0 - 0.7 K/uL   Basophils Absolute 0.0 0.0 - 0.1 K/uL  IBC panel     Status: Abnormal   Collection Time: 12/08/17  8:35 AM  Result Value Ref Range   Iron 49 42 - 145 ug/dL   Transferrin 205.0 (L) 212.0 - 360.0 mg/dL   Saturation Ratios 17.1 (L) 20.0 - 50.0 %  Ferritin     Status: None   Collection Time: 12/08/17  8:35 AM  Result Value Ref Range   Ferritin 91.5 10.0 - 291.0 ng/mL  Urinalysis, Routine w reflex microscopic     Status: None   Collection Time: 02/04/18  4:25 PM  Result Value Ref Range   Specific Gravity, UA 1.026 1.005 - 1.030   pH, UA 5.5 5.0 - 7.5   Color, UA Yellow Yellow   Appearance Ur Clear Clear   Leukocytes, UA Negative Negative   Protein, UA Negative Negative/Trace   Glucose, UA Negative Negative   Ketones, UA Negative Negative   RBC, UA Negative Negative   Bilirubin, UA Negative Negative   Urobilinogen, Ur 0.2 0.2 - 1.0 mg/dL   Nitrite, UA Negative Negative   Microscopic Examination Comment     Comment: Microscopic not indicated and not performed.   Objective  Body mass index is 41.19 kg/m. Wt Readings from Last 3 Encounters:  02/04/18 255 lb 3.2 oz (115.8 kg)  11/13/17 270 lb  (122.5 kg)  05/07/17 250 lb 6.4 oz (113.6 kg)   Temp Readings from Last 3 Encounters:  02/04/18 98.6 F (37 C) (Oral)  11/13/17 97.9 F (36.6 C) (  Oral)  05/07/17 98 F (36.7 C) (Oral)   BP Readings from Last 3 Encounters:  02/04/18 138/70  11/13/17 125/76  05/07/17 135/80   Pulse Readings from Last 3 Encounters:  02/04/18 71  11/13/17 66  05/07/17 77    Physical Exam  Constitutional: She is oriented to person, place, and time. Vital signs are normal. She appears well-developed and well-nourished. She is cooperative.  HENT:  Head: Normocephalic and atraumatic.  Mouth/Throat: Oropharynx is clear and moist and mucous membranes are normal.  Eyes: Pupils are equal, round, and reactive to light. Conjunctivae are normal.  Cardiovascular: Normal rate and regular rhythm.  Murmur heard. Pulmonary/Chest: Effort normal and breath sounds normal. Right breast exhibits no inverted nipple, no mass, no nipple discharge, no skin change and no tenderness. Left breast exhibits no inverted nipple, no mass, no nipple discharge, no skin change and no tenderness. No breast swelling, tenderness, discharge or bleeding. Breasts are symmetrical.  Abdominal: Soft. Normal appearance and bowel sounds are normal.    Neurological: She is alert and oriented to person, place, and time. Gait normal.  Skin: Skin is warm, dry and intact.  Intertrigo   Psychiatric: She has a normal mood and affect. Her speech is normal and behavior is normal. Judgment and thought content normal. Cognition and memory are normal.  Nursing note and vitals reviewed.   Assessment   1. Annual (with cardiac murmer heard)  2. Cardiac murmur  3. Intertrigo  4. Vit D def 5. Leukopenia  6. Obesity BMI 41.19  7. Dysuria   Plan  1.  utd flu, Tdap Check mmr and hep B status  Disc shingrix at f/u  Breast exam today referral mammo [facility] 3 d due 02/26/18  Pap 12/11/16 neg pap neg hpv Colonoscopy 03/05/17 [facility] repeat in 10  years  Disc exercise to lose and  Healthy diet options  2. Referred echo  3. Disc zeasorb AF powder, clotrimazole and hc otc with flare and zeasorb for maintenance  Disc mild skin care  4 D3 x 6 months then otc 5000 iu qd  5. rec resch appt wit hH/o  6. See above needs to lose  7. Std check today and UA and culture   Need to get names of 3 eye drops from her pharmacy  Provider: Dr. Olivia Mackie McLean-Scocuzza-[facility]

## 2018-02-05 NOTE — Telephone Encounter (Signed)
Call pharmacy and get all eye drops names and frequency and dose and document in chart please  She is on 3   TMS

## 2018-02-06 LAB — URINE CULTURE

## 2018-02-08 LAB — URINE CYTOLOGY ANCILLARY ONLY
Chlamydia: NEGATIVE
Neisseria Gonorrhea: NEGATIVE
Trichomonas: NEGATIVE

## 2018-02-10 LAB — URINE CYTOLOGY ANCILLARY ONLY: CANDIDA VAGINITIS: NEGATIVE

## 2018-02-10 NOTE — Telephone Encounter (Signed)
Pharmacy is faxing over list

## 2018-02-11 ENCOUNTER — Telehealth: Payer: Self-pay

## 2018-02-11 ENCOUNTER — Telehealth: Payer: Self-pay | Admitting: Internal Medicine

## 2018-02-11 ENCOUNTER — Other Ambulatory Visit: Payer: Self-pay | Admitting: Internal Medicine

## 2018-02-11 DIAGNOSIS — N760 Acute vaginitis: Principal | ICD-10-CM

## 2018-02-11 DIAGNOSIS — B9689 Other specified bacterial agents as the cause of diseases classified elsewhere: Secondary | ICD-10-CM

## 2018-02-11 MED ORDER — METRONIDAZOLE 500 MG PO TABS: 500.0000 mg | ORAL_TABLET | Freq: Two times a day (BID) | ORAL | 0 refills | Status: DC

## 2018-02-11 NOTE — Telephone Encounter (Signed)
Copied from CRM [id number]. Topic: General - Other >> Feb 11, 2018  9:56 AM Gaynelle AduPoole, Shalonda wrote: Reason for CRM: Patient called and requested to speak with Mal AmabileBrock, stated he left a voicemail on yesterday. Best contact number is [phone]. Please advise

## 2018-02-11 NOTE — Telephone Encounter (Signed)
Treat BV with flagyl

## 2018-02-11 NOTE — Telephone Encounter (Signed)
See other notes   TMS

## 2018-02-12 NOTE — Telephone Encounter (Signed)
Pt is returning brock call °

## 2018-02-22 ENCOUNTER — Ambulatory Visit
Admission: RE | Admit: 2018-02-22 | Discharge: 2018-02-22 | Disposition: A | Discharge status: Home / Self Care | Payer: Federal, State, Local not specified - PPO | Source: Ambulatory Visit | Attending: Internal Medicine | Admitting: Internal Medicine

## 2018-02-22 DIAGNOSIS — R011 Cardiac murmur, unspecified: Secondary | ICD-10-CM

## 2018-02-22 DIAGNOSIS — D649 Anemia, unspecified: Secondary | ICD-10-CM | POA: Diagnosis not present

## 2018-02-22 DIAGNOSIS — R7303 Prediabetes: Secondary | ICD-10-CM | POA: Insufficient documentation

## 2018-02-22 NOTE — Progress Notes (Signed)
*  PRELIMINARY RESULTS* Echocardiogram 2D Echocardiogram has been performed.  Cristela BlueHege, Selby Foisy 02/22/2018, 11:51 AM

## 2018-02-24 ENCOUNTER — Telehealth: Payer: Self-pay | Admitting: *Deleted

## 2018-02-24 NOTE — Telephone Encounter (Signed)
Copied from CRM [id number]. Topic: General - Other >> Feb 24, 2018  4:09 PM Gean BirchwoodWilliams-Neal, Sade R wrote: Pt is calling in requesting all of her results ( labs , ekg , echocardiogram ) from when she got her physical until today mailed to her home

## 2018-03-12 ENCOUNTER — Telehealth: Payer: Self-pay

## 2018-03-12 NOTE — Telephone Encounter (Signed)
Copied from CRM [id number]. Topic: Referral - Request >> Mar 12, 2018  9:24 AM Baldo DaubAlexander, Amber L wrote: Reason for CRM:   Pt states she wants a referral to get her mammogram done at [facility] - the one that has the 3D imaging in [place]. Pt can be reached at [phone].

## 2018-03-15 NOTE — Telephone Encounter (Signed)
Resent to [facility]. [facility] to schedule [facility]t.

## 2018-03-16 ENCOUNTER — Encounter

## 2018-03-16 ENCOUNTER — Ambulatory Visit (INDEPENDENT_AMBULATORY_CARE_PROVIDER_SITE_OTHER): Payer: Federal, State, Local not specified - PPO

## 2018-03-16 ENCOUNTER — Encounter: Payer: Self-pay | Admitting: Podiatry

## 2018-03-16 ENCOUNTER — Ambulatory Visit (INDEPENDENT_AMBULATORY_CARE_PROVIDER_SITE_OTHER): Payer: Federal, State, Local not specified - PPO | Admitting: Podiatry

## 2018-03-16 DIAGNOSIS — M722 Plantar fascial fibromatosis: Secondary | ICD-10-CM | POA: Diagnosis not present

## 2018-03-16 DIAGNOSIS — M84375A Stress fracture, left foot, initial encounter for fracture: Secondary | ICD-10-CM

## 2018-03-16 MED ORDER — METHYLPREDNISOLONE 4 MG PO TBPK: ORAL_TABLET | ORAL | 0 refills | Status: DC

## 2018-03-17 ENCOUNTER — Other Ambulatory Visit: Payer: Federal, State, Local not specified - PPO | Admitting: Orthotics

## 2018-03-17 DIAGNOSIS — M722 Plantar fascial fibromatosis: Secondary | ICD-10-CM | POA: Diagnosis not present

## 2018-03-17 DIAGNOSIS — M84375A Stress fracture, left foot, initial encounter for fracture: Secondary | ICD-10-CM | POA: Diagnosis not present

## 2018-03-18 NOTE — Progress Notes (Signed)
   HPI: 52 year old female presenting today with a chief complaint of pain to the dorsal, lateral and medial aspects of the left foot that began suddenly 1-2 weeks ago. 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 She has been taking Meloxicam and wearing arch supports for treatment. Walking increases the pain which causes her to limp. Patient is here for further evaluation and treatment.   Past Medical History:  Diagnosis Date  . Anemia    iron infusions in past  . Arthritis    right knee (also torn meniscus)  . Childhood asthma   . Eczema   . Iritis    Followed by Dr. Marti Sleighhurman  . Motion sickness    back seat cars  . Prediabetes   . Urinary tract bacterial infections      Physical Exam: General: The patient is alert and oriented x3 in no acute distress.  Dermatology: Skin is warm, dry and supple bilateral lower extremities. Negative for open lesions or macerations.  Vascular: Palpable pedal pulses bilaterally. No edema or erythema noted. Capillary refill within normal limits.  Neurological: Epicritic and protective threshold grossly intact bilaterally.   Musculoskeletal Exam: Pain with palpation to the 4th metatarsal of the left foot. Range of motion within normal limits to all pedal and ankle joints bilateral. Muscle strength 5/5 in all groups bilateral.   Radiographic Exam:  Normal osseous mineralization. Joint spaces preserved. No fracture/dislocation/boney destruction.    Assessment: 1. Stress fracture 4th metatarsal left foot based on clinical diagnosis   Plan of Care:  1. Patient evaluated. X-Rays reviewed.  2. Prescription for Medrol Dose Pak provided to patient. Resume taking Meloxicam after Medrol completed.  3. Appointment with Raiford Nobleick for custom molded orthotics.  4. Return to clinic in 4 weeks.      Felecia ShellingBrent M. Savreen Gebhardt, DPM [facility]  Dr. Felecia ShellingBrent M. Ondria Oswald, DPM    [address]                                         [place], [place] [address]                Office [phone]  Fax [phone]

## 2018-04-05 ENCOUNTER — Ambulatory Visit: Payer: Federal, State, Local not specified - PPO | Admitting: Orthotics

## 2018-04-05 DIAGNOSIS — M722 Plantar fascial fibromatosis: Secondary | ICD-10-CM

## 2018-04-05 DIAGNOSIS — S92514D Nondisplaced fracture of proximal phalanx of right lesser toe(s), subsequent encounter for fracture with routine healing: Secondary | ICD-10-CM

## 2018-04-05 DIAGNOSIS — L209 Atopic dermatitis, unspecified: Secondary | ICD-10-CM | POA: Diagnosis not present

## 2018-04-05 DIAGNOSIS — M84375A Stress fracture, left foot, initial encounter for fracture: Secondary | ICD-10-CM

## 2018-04-05 NOTE — Progress Notes (Signed)
Patient came in today to pick up custom made foot orthotics.  The goals were accomplished and the patient reported no dissatisfaction with said orthotics.  Patient was advised of breakin period and how to report any issues. 

## 2018-04-13 ENCOUNTER — Encounter: Payer: Self-pay | Admitting: Internal Medicine

## 2018-04-13 ENCOUNTER — Ambulatory Visit: Payer: Federal, State, Local not specified - PPO | Admitting: Podiatry

## 2018-04-13 DIAGNOSIS — K080 Exfoliation of teeth due to systemic causes: Secondary | ICD-10-CM | POA: Diagnosis not present

## 2018-04-13 DIAGNOSIS — Z1231 Encounter for screening mammogram for malignant neoplasm of breast: Secondary | ICD-10-CM | POA: Diagnosis not present

## 2018-04-15 ENCOUNTER — Telehealth: Payer: Self-pay | Admitting: Internal Medicine

## 2018-04-15 NOTE — Telephone Encounter (Signed)
mammo 04/13/18 [facility] normal

## 2018-04-21 ENCOUNTER — Ambulatory Visit: Payer: Federal, State, Local not specified - PPO | Admitting: Orthotics

## 2018-04-21 DIAGNOSIS — M722 Plantar fascial fibromatosis: Secondary | ICD-10-CM

## 2018-04-21 DIAGNOSIS — S92514A Nondisplaced fracture of proximal phalanx of right lesser toe(s), initial encounter for closed fracture: Secondary | ICD-10-CM

## 2018-04-21 NOTE — Progress Notes (Signed)
Patient complained about the f/o at lateral edge posterior of shell...carved

## 2018-04-29 DIAGNOSIS — H35353 Cystoid macular degeneration, bilateral: Secondary | ICD-10-CM | POA: Diagnosis not present

## 2018-04-29 DIAGNOSIS — H35373 Puckering of macula, bilateral: Secondary | ICD-10-CM | POA: Diagnosis not present

## 2018-04-29 DIAGNOSIS — H21543 Posterior synechiae (iris), bilateral: Secondary | ICD-10-CM | POA: Diagnosis not present

## 2018-04-29 DIAGNOSIS — H2013 Chronic iridocyclitis, bilateral: Secondary | ICD-10-CM | POA: Diagnosis not present

## 2018-05-07 ENCOUNTER — Ambulatory Visit: Payer: Federal, State, Local not specified - PPO | Admitting: Internal Medicine

## 2018-06-10 ENCOUNTER — Telehealth: Payer: Self-pay | Admitting: Internal Medicine

## 2018-06-10 ENCOUNTER — Ambulatory Visit: Payer: Federal, State, Local not specified - PPO | Admitting: Internal Medicine

## 2018-06-10 DIAGNOSIS — H2013 Chronic iridocyclitis, bilateral: Secondary | ICD-10-CM | POA: Diagnosis not present

## 2018-06-10 DIAGNOSIS — H2141 Pupillary membranes, right eye: Secondary | ICD-10-CM | POA: Diagnosis not present

## 2018-06-10 DIAGNOSIS — H26491 Other secondary cataract, right eye: Secondary | ICD-10-CM | POA: Diagnosis not present

## 2018-06-10 NOTE — Telephone Encounter (Signed)
Inform pt stop weekly high dose D3 and buy otc vitamin D3 5000 IU to take daily   TMS

## 2018-06-11 NOTE — Telephone Encounter (Signed)
Patient has been notified. She stated she will comply

## 2018-06-16 DIAGNOSIS — H35351 Cystoid macular degeneration, right eye: Secondary | ICD-10-CM | POA: Diagnosis not present

## 2018-06-16 DIAGNOSIS — H209 Unspecified iridocyclitis: Secondary | ICD-10-CM | POA: Diagnosis not present

## 2018-06-16 DIAGNOSIS — H4043X1 Glaucoma secondary to eye inflammation, bilateral, mild stage: Secondary | ICD-10-CM | POA: Diagnosis not present

## 2018-06-16 DIAGNOSIS — H02402 Unspecified ptosis of left eyelid: Secondary | ICD-10-CM | POA: Diagnosis not present

## 2018-07-22 ENCOUNTER — Ambulatory Visit
Admission: EM | Admit: 2018-07-22 | Discharge: 2018-07-22 | Disposition: A | Discharge status: Home / Self Care | Payer: Federal, State, Local not specified - PPO | Attending: Family Medicine | Admitting: Family Medicine

## 2018-07-22 ENCOUNTER — Encounter: Payer: Self-pay | Admitting: Gynecology

## 2018-07-22 ENCOUNTER — Other Ambulatory Visit: Payer: Self-pay

## 2018-07-22 DIAGNOSIS — R6889 Other general symptoms and signs: Secondary | ICD-10-CM | POA: Insufficient documentation

## 2018-07-22 DIAGNOSIS — R05 Cough: Secondary | ICD-10-CM

## 2018-07-22 MED ORDER — OSELTAMIVIR PHOSPHATE 75 MG PO CAPS: 75.0000 mg | ORAL_CAPSULE | Freq: Two times a day (BID) | ORAL | 0 refills | Status: DC

## 2018-07-22 MED ORDER — BENZONATATE 200 MG PO CAPS: ORAL_CAPSULE | ORAL | 0 refills | Status: DC

## 2018-07-22 MED ORDER — IBUPROFEN 800 MG PO TABS: 400.0000 mg | ORAL_TABLET | Freq: Three times a day (TID) | ORAL | 0 refills | Status: DC | PRN

## 2018-07-22 NOTE — ED Triage Notes (Signed)
Patient c/o cough/ not feeling well.

## 2018-07-22 NOTE — Discharge Instructions (Addendum)
Plenty of fluids.  Rest as much as possible.

## 2018-07-22 NOTE — ED Provider Notes (Signed)
[facility]    CSN: [id number] Arrival date & time: 07/22/18  [id number]     History   Chief Complaint No chief complaint on file.   HPI Hannah Robinson is a 53 y.o. female.   HPI  -year-old female presents with symptoms of severe fatigue cough headache slight nausea.  She states that she started having a scratchy throat yesterday that quickly progressed to feeling terrible.  Is exposed to her knees and her boyfriend who was diagnosed with influenza A.  She has not been running a high fever.  She did not receive her flu shot this year.         Past Medical History:  Diagnosis Date  . Anemia    iron infusions in past  . Arthritis    right knee (also torn meniscus)  . Childhood asthma   . Eczema   . Iritis    Followed by Dr. Marti Sleighhurman  . Motion sickness    back seat cars  . Prediabetes   . Urinary tract bacterial infections     Patient Active Problem List   Diagnosis Date Noted  . Annual visit for general adult medical examination with abnormal findings 02/05/2018  . Intertrigo 02/05/2018  . Obesity, Class III, BMI 40-49.9 (morbid obesity) (HCC) 02/05/2018  . Leukopenia 12/27/2017  . Special screening for malignant neoplasms, colon   . Acute bronchitis 06/18/2016  . Skin lesion 12/07/2015  . Right knee pain 10/12/2015  . BMI 30.0-30.9,adult 12/04/2014  . Candidal skin infection 12/04/2014  . Routine general medical examination at a health care facility 09/02/2013  . Dyshidrotic eczema 09/02/2013  . Dysuria 08/09/2012  . Vitamin D deficiency 08/09/2012  . Iron deficiency anemia 03/01/2012  . Screening for skin cancer 03/01/2012  . Iritis 03/01/2012  . Asthma 08/25/2011  . Rhinitis, allergic 08/25/2011  . Reflux 08/25/2011    Past Surgical History:  Procedure Laterality Date  . BREAST SURGERY  1988   benign  . CATARACT EXTRACTION W/ INTRAOCULAR LENS IMPLANT Left    b/l eye surgery for cataracts   . COLONOSCOPY WITH PROPOFOL N/A 03/05/2017   Procedure: COLONOSCOPY WITH PROPOFOL;  Surgeon: Midge MiniumWohl, Darren, MD;  [facility];  Service: Endoscopy;  Laterality: N/A;  . VAGINAL DELIVERY     2    OB History   No obstetric history on file.      Home Medications    Prior to Admission medications   Medication Sig Start Date End Date Taking? Authorizing Provider  albuterol (PROAIR HFA) 108 (90 Base) MCG/ACT inhaler Inhale 2 puffs into the lungs every 4 (four) hours as needed. 01/20/18  Yes McLean-Scocuzza, Pasty Spillersracy N, MD  BREO ELLIPTA 100-25 MCG/INH AEPB TAKE 1 PUFF BY MOUTH EVERY DAY 11/09/17  Yes Arnett, Lyn RecordsMargaret G, FNP  cetirizine (ZYRTEC) 10 MG tablet Take 1 tablet (10 mg total) by mouth daily. 06/18/16  Yes Allegra GranaArnett, Margaret G, FNP  DUREZOL 0.05 % EMUL PLACE 1 DROP b/l bid 10/31/16  Yes [provider]  ferrous sulfate 325 (65 FE) MG tablet Take 325 mg by mouth daily with breakfast.   Yes [provider]  fluticasone (FLONASE) 50 MCG/ACT nasal spray Place 2 sprays into both nostrils daily. 06/18/16  Yes Allegra GranaArnett, Margaret G, FNP  ibuprofen (ADVIL,MOTRIN) 800 MG tablet Take 1 tablet (800 mg total) by mouth 3 (three) times daily. 11/13/17  Yes Lutricia Feiloemer, Sherrye Puga P, PA-C  meloxicam (MOBIC) 15 MG tablet meloxicam 15 mg tablet  TAKE 1 TABLET(S) EVERY  DAY BY ORAL ROUTE. 01/22/16  Yes [provider]  benzonatate (TESSALON) 200 MG capsule Take one cap TID PRN cough 07/22/18   Lutricia Feil, PA-C  ibuprofen (ADVIL,MOTRIN) 800 MG tablet Take 0.5 tablets (400 mg total) by mouth every 8 (eight) hours as needed. 07/22/18   Lutricia Feil, PA-C  metroNIDAZOLE (FLAGYL) 500 MG tablet Take 1 tablet (500 mg total) by mouth 2 (two) times daily. With food 02/11/18   McLean-Scocuzza, Pasty Spillers, MD  oseltamivir (TAMIFLU) 75 MG capsule Take 1 capsule (75 mg total) by mouth every 12 (twelve) hours. 07/22/18   Lutricia Feil, PA-C  norethindrone (MICRONOR,CAMILA,ERRIN) 0.35 MG tablet Take 1 tablet by mouth daily.  10/08/11   [provider]    Family History Family History  Problem Relation Age of Onset  . Lung cancer Maternal Aunt   . Alcohol abuse Maternal Aunt   . Clotting disorder Mother   . Arthritis Mother   . Heart disease Mother   . Bipolar disorder Brother   . Stroke Maternal Grandmother   . Cancer Maternal Aunt        lung  . Asthma Son     Social History Social History   Tobacco Use  . Smoking status: Never Smoker  . Smokeless tobacco: Never Used  Substance Use Topics  . Alcohol use: Yes    Alcohol/week: 11.0 standard drinks    Types: 4 Standard drinks or equivalent, 7 Glasses of wine per week    Comment: Wine  . Drug use: No     Allergies   Other; Penicillins; and Amoxicillin   Review of Systems Review of Systems  Constitutional: Positive for activity change and chills. Negative for appetite change, fatigue and fever.  HENT: Positive for congestion and sore throat.   Respiratory: Positive for cough.   All other systems reviewed and are negative.    Physical Exam Triage Vital Signs ED Triage Vitals  Enc Vitals Group     BP 07/22/18 0842 118/82     Pulse Rate 07/22/18 0842 96     Resp 07/22/18 0842 18     Temp 07/22/18 0842 98.9 F (37.2 C)     Temp Source 07/22/18 0842 Oral     SpO2 07/22/18 0842 99 %     Weight 07/22/18 0843 240 lb (108.9 kg)     Height 07/22/18 0843 5\' 6"  (1.676 m)     Head Circumference --      Peak Flow --      Pain Score 07/22/18 0918 0     Pain Loc --      Pain Edu? --      Excl. in GC? --    No data found.  Updated Vital Signs BP 118/82 (BP Location: Left Arm)   Pulse 96   Temp 98.9 F (37.2 C) (Oral)   Resp 18   Ht 5\' 6"  (1.676 m)   Wt 240 lb (108.9 kg)   SpO2 99%   BMI 38.74 kg/m   Visual Acuity Right Eye Distance:   Left Eye Distance:   Bilateral Distance:    Right Eye Near:   Left Eye Near:    Bilateral Near:     Physical Exam Vitals signs and nursing note reviewed.  Constitutional:      General:  She is not in acute distress.    Appearance: Normal appearance. She is obese. She is not ill-appearing, toxic-appearing or diaphoretic.  HENT:     Head: Normocephalic.  Right Ear: Tympanic membrane and ear canal normal.     Left Ear: Tympanic membrane and ear canal normal.     Nose: Nose normal. No congestion.     Mouth/Throat:     Mouth: Mucous membranes are moist.     Pharynx: Oropharynx is clear. No oropharyngeal exudate or posterior oropharyngeal erythema.  Eyes:     General:        Right eye: No discharge.        Left eye: No discharge.     Conjunctiva/sclera: Conjunctivae normal.     Pupils: Pupils are equal, round, and reactive to light.  Neck:     Musculoskeletal: Normal range of motion and neck supple.  Pulmonary:     Effort: Pulmonary effort is normal.     Breath sounds: Normal breath sounds.  Musculoskeletal: Normal range of motion.  Lymphadenopathy:     Cervical: No cervical adenopathy.  Skin:    General: Skin is warm and dry.  Neurological:     General: No focal deficit present.     Mental Status: She is alert and oriented to person, place, and time.  Psychiatric:        Mood and Affect: Mood normal.        Behavior: Behavior normal.        Thought Content: Thought content normal.        Judgment: Judgment normal.      UC Treatments / Results  Labs (all labs ordered are listed, but only abnormal results are displayed) Labs Reviewed - No data to display  EKG None  Radiology No results found.  Procedures Procedures (including critical care time)  Medications Ordered in UC Medications - No data to display  Initial Impression / Assessment and Plan / UC Course  I have reviewed the triage vital signs and the nursing notes.  Pertinent labs & imaging results that were available during my care of the patient were reviewed by me and considered in my medical decision making (see chart for details).   Patient resents here with flulike illness.  She does  not have high fevers at this point but just started essentially yesterday and today.  I have offered her Tamiflu which she accepted.  We will also treat her cough symptomatically.  She is not improving she should follow-up with her primary care physician   Final Clinical Impressions(s) / UC Diagnoses   Final diagnoses:  Flu-like symptoms     Discharge Instructions     Plenty of fluids.  Rest as much as possible.   ED Prescriptions    Medication Sig Dispense Auth. Provider   oseltamivir (TAMIFLU) 75 MG capsule Take 1 capsule (75 mg total) by mouth every 12 (twelve) hours. 10 capsule Ovid Curd P, PA-C   benzonatate (TESSALON) 200 MG capsule Take one cap TID PRN cough 30 capsule Ovid Curd P, PA-C   ibuprofen (ADVIL,MOTRIN) 800 MG tablet Take 0.5 tablets (400 mg total) by mouth every 8 (eight) hours as needed. 90 tablet Lutricia Feil, PA-C     Controlled Substance Prescriptions [place] Controlled Substance Registry consulted? Not Applicable   Lutricia Feil, PA-C 07/22/18 6606

## 2018-07-26 ENCOUNTER — Encounter: Payer: Self-pay | Admitting: Family

## 2018-07-26 ENCOUNTER — Ambulatory Visit: Payer: Federal, State, Local not specified - PPO | Admitting: Family

## 2018-07-26 VITALS — BP 122/72 | HR 98 | Temp 98.0°F | Wt 248.8 lb

## 2018-07-26 DIAGNOSIS — R42 Dizziness and giddiness: Secondary | ICD-10-CM

## 2018-07-26 NOTE — Progress Notes (Signed)
Subjective:    Patient ID: Hannah RouxYvette C Robinson, female    DOB: 12/31/1965, 53 y.o.   MRN: [id number]  CC: Hannah RouxYvette C Robinson is a 53 y.o. female who presents today for an acute visit.    HPI: Here today due to nausea and  dizzy . Feels like drank too much.'  Doesn't feel like will pass out, 'feels like needs to rest. '  No headache, vision changes. Had cough , chills, 5 days ago, with improvement.  Started coughing at that time. Congestion has also improved. No wheezing, sob, CP.  Never was achey or had fever.   Still on tamiflu. Didn't take medication today.  Not drinking water.  Using tessalon. Usually used flonase however hasnt since been sick.   Seen at Urgent care 4 days ago.     H/o asthma     HISTORY:  Past Medical History:  Diagnosis Date  . Anemia    iron infusions in past  . Arthritis    right knee (also torn meniscus)  . Childhood asthma   . Eczema   . Iritis    Followed by Dr. Marti Sleighhurman  . Motion sickness    back seat cars  . Prediabetes   . Urinary tract bacterial infections    Past Surgical History:  Procedure Laterality Date  . BREAST SURGERY  1988   benign  . CATARACT EXTRACTION W/ INTRAOCULAR LENS IMPLANT Left    b/l eye surgery for cataracts   . COLONOSCOPY WITH PROPOFOL N/A 03/05/2017   Procedure: COLONOSCOPY WITH PROPOFOL;  Surgeon: Midge MiniumWohl, Darren, MD;  Location: [facility];  Service: Endoscopy;  Laterality: N/A;  . VAGINAL DELIVERY     2   Family History  Problem Relation Age of Onset  . Lung cancer Maternal Aunt   . Alcohol abuse Maternal Aunt   . Clotting disorder Mother   . Arthritis Mother   . Heart disease Mother   . Bipolar disorder Brother   . Stroke Maternal Grandmother   . Cancer Maternal Aunt        lung  . Asthma Son     Allergies: Other; Penicillins; and Amoxicillin Current Outpatient Medications on File Prior to Visit  Medication Sig Dispense Refill  . albuterol (PROAIR HFA) 108 (90 Base) MCG/ACT inhaler Inhale 2  puffs into the lungs every 4 (four) hours as needed. 8.5 Inhaler 11  . benzonatate (TESSALON) 200 MG capsule Take one cap TID PRN cough 30 capsule 0  . BREO ELLIPTA 100-25 MCG/INH AEPB TAKE 1 PUFF BY MOUTH EVERY DAY 60 each 2  . cetirizine (ZYRTEC) 10 MG tablet Take 1 tablet (10 mg total) by mouth daily. 30 tablet 11  . DUREZOL 0.05 % EMUL PLACE 1 DROP b/l bid  6  . ferrous sulfate 325 (65 FE) MG tablet Take 325 mg by mouth daily with breakfast.    . fluticasone (FLONASE) 50 MCG/ACT nasal spray Place 2 sprays into both nostrils daily. 16 g 8  . ibuprofen (ADVIL,MOTRIN) 800 MG tablet Take 1 tablet (800 mg total) by mouth 3 (three) times daily. 90 tablet 0  . ibuprofen (ADVIL,MOTRIN) 800 MG tablet Take 0.5 tablets (400 mg total) by mouth every 8 (eight) hours as needed. 90 tablet 0  . meloxicam (MOBIC) 15 MG tablet meloxicam 15 mg tablet  TAKE 1 TABLET(S) EVERY DAY BY ORAL ROUTE.    . metroNIDAZOLE (FLAGYL) 500 MG tablet Take 1 tablet (500 mg total) by mouth 2 (two) times daily. With food  14 tablet 0  . oseltamivir (TAMIFLU) 75 MG capsule Take 1 capsule (75 mg total) by mouth every 12 (twelve) hours. 10 capsule 0  . [DISCONTINUED] norethindrone (MICRONOR,CAMILA,ERRIN) 0.35 MG tablet Take 1 tablet by mouth daily.     No current facility-administered medications on file prior to visit.     Social History   Tobacco Use  . Smoking status: Never Smoker  . Smokeless tobacco: Never Used  Substance Use Topics  . Alcohol use: Yes    Alcohol/week: 11.0 standard drinks    Types: 4 Standard drinks or equivalent, 7 Glasses of wine per week    Comment: Wine  . Drug use: No    Review of Systems  Constitutional: Negative for chills and fever.  HENT: Positive for congestion (improved).   Respiratory: Positive for cough (improved). Negative for shortness of breath and wheezing.   Cardiovascular: Negative for chest pain and palpitations.  Gastrointestinal: Negative for nausea and vomiting.    Neurological: Positive for dizziness. Negative for headaches.      Objective:    BP 122/72 (BP Location: Left Arm, Patient Position: Sitting, Cuff Size: Large)   Pulse 98   Temp 98 F (36.7 C)   Wt 248 lb 12.8 oz (112.9 kg)   SpO2 99%   BMI 40.16 kg/m    Physical Exam Vitals signs reviewed.  Constitutional:      Appearance: She is well-developed.  HENT:     Head: Normocephalic and atraumatic.     Right Ear: Hearing, tympanic membrane, ear canal and external ear normal. No decreased hearing noted. No drainage, swelling or tenderness. No middle ear effusion. No foreign body. Tympanic membrane is not erythematous or bulging.     Left Ear: Hearing, tympanic membrane, ear canal and external ear normal. No decreased hearing noted. No drainage, swelling or tenderness.  No middle ear effusion. No foreign body. Tympanic membrane is not erythematous or bulging.     Nose: Nose normal. No rhinorrhea.     Right Sinus: No maxillary sinus tenderness or frontal sinus tenderness.     Left Sinus: No maxillary sinus tenderness or frontal sinus tenderness.     Mouth/Throat:     Pharynx: Uvula midline. No oropharyngeal exudate, posterior oropharyngeal erythema or uvula swelling.     Tonsils: No tonsillar abscesses.  Eyes:     General: Lids are normal. Lids are everted, no foreign bodies appreciated.     Conjunctiva/sclera: Conjunctivae normal.     Pupils: Pupils are equal, round, and reactive to light.  Cardiovascular:     Rate and Rhythm: Normal rate and regular rhythm.     Pulses: Normal pulses.     Heart sounds: Normal heart sounds.  Pulmonary:     Effort: Pulmonary effort is normal.     Breath sounds: Normal breath sounds. No wheezing, rhonchi or rales.  Lymphadenopathy:     Head:     Right side of head: No submental, submandibular, tonsillar, preauricular, posterior auricular or occipital adenopathy.     Left side of head: No submental, submandibular, tonsillar, preauricular, posterior  auricular or occipital adenopathy.     Cervical: No cervical adenopathy.     Right cervical: No superficial, deep or posterior cervical adenopathy.    Left cervical: No superficial, deep or posterior cervical adenopathy.  Skin:    General: Skin is warm and dry.  Neurological:     Mental Status: She is alert.     Cranial Nerves: No cranial nerve deficit.  Sensory: No sensory deficit.     Deep Tendon Reflexes:     Reflex Scores:      Bicep reflexes are 2+ on the right side and 2+ on the left side.      Patellar reflexes are 2+ on the right side and 2+ on the left side.    Comments: Grip equal and strong bilateral upper extremities. Gait strong and steady. Able to perform finger-to-nose without difficulty.    Psychiatric:        Speech: Speech normal.        Behavior: Behavior normal.        Thought Content: Thought content normal.        Assessment & Plan:   1. Dizziness Patient is well-appearing, no acute respiratory distress.  She is nontoxic in appearance.  I am reassured by her normal neurologic exam.  Discussed with patient clinical course of viruses, particularly if the patient had influenza.  Suspect reasonable that patient has nausea, and some dizziness as patient appears to have described from either virus, dehydration, and/or side effect of Tamiflu.  She is not orthostatic ( see flow sheet) We jointly agreed that increasing hydration, and more rest would be appropriate next step.  Patient will finish prescribed course of Tamiflu and let us know if she is not better    I am having [place] C. Kauffman maintain her fluticasone, cetirizine, DUREZOL, BREO ELLIPTA, ibuprofen, albuterol, ferrous sulfate, metroNIDAZOLE, meloxicam, oseltamivir, benzonatate, and ibuprofen.   No orders of the defined types were placed in this encounter.   Return precautions given.   Risks, benefits, and alternatives of the medications and treatment plan prescribed today were discussed, and  patient expressed understanding.   Education regarding symptom management and diagnosis given to patient on AVS.  Continue to follow with McLean-Scocuzza, Pasty Spillers, MD for routine health maintenance.   Hannah Robinson and I agreed with plan.   Rennie Plowman, FNP

## 2018-07-26 NOTE — Patient Instructions (Addendum)
Start flonase again PLENTY of water  REST REST REST  Let us know if you are not better

## 2018-08-04 ENCOUNTER — Telehealth: Payer: Self-pay | Admitting: *Deleted

## 2018-08-04 NOTE — Telephone Encounter (Signed)
Copied from CRM [id number]. Topic: General - Other >> Aug 04, 2018 11:51 AM Lynne Logan D wrote: Reason for CRM: Pt stated that she spoke with Dr. French Ana regarding FMLA paperwork and that it would be hand delivered today to the front desk. Please advise pt once paperwork is ready for pickup.

## 2018-08-04 NOTE — Telephone Encounter (Signed)
Copied from CRM [id number]. Topic: General - Other >> Aug 04, 2018 11:51 AM Lynne Logan D wrote: Reason for CRM: Pt stated that she spoke with Dr. French Ana regarding FMLA paperwork and that it would be hand delivered today to the front desk. Please advise pt once paperwork is ready for pickup. >> Aug 04, 2018  4:51 PM Wyonia Hough E wrote: Pt called to confirm if her FMLA paperwork was delivered by the mail carrier today/ please call patient to confirm and call when paperwork is ready for pick up

## 2018-08-06 NOTE — Telephone Encounter (Signed)
Pt requesting for Erie Noe to call her regarding FMLA paperwork.

## 2018-08-06 NOTE — Telephone Encounter (Signed)
Pt called again to followup and see if FMLA paperwork has been received. Please let pt know if it has or has not been received.

## 2018-08-09 NOTE — Telephone Encounter (Signed)
Mal Amabile called the patient.

## 2018-09-02 DIAGNOSIS — H35373 Puckering of macula, bilateral: Secondary | ICD-10-CM | POA: Diagnosis not present

## 2018-09-02 DIAGNOSIS — H2013 Chronic iridocyclitis, bilateral: Secondary | ICD-10-CM | POA: Diagnosis not present

## 2018-09-02 DIAGNOSIS — H35353 Cystoid macular degeneration, bilateral: Secondary | ICD-10-CM | POA: Diagnosis not present

## 2018-10-06 DIAGNOSIS — H4043X1 Glaucoma secondary to eye inflammation, bilateral, mild stage: Secondary | ICD-10-CM | POA: Diagnosis not present

## 2018-10-08 ENCOUNTER — Other Ambulatory Visit: Payer: Self-pay | Admitting: Podiatry

## 2018-12-27 ENCOUNTER — Telehealth: Payer: Self-pay | Admitting: Internal Medicine

## 2018-12-27 NOTE — Telephone Encounter (Signed)
Pt would like to have fasting labs done before appt 08/07. Please advise? Thank you!

## 2018-12-28 NOTE — Telephone Encounter (Signed)
There are no lab orders.  Please advise.

## 2018-12-29 ENCOUNTER — Other Ambulatory Visit: Payer: Self-pay | Admitting: Internal Medicine

## 2018-12-29 DIAGNOSIS — Z13220 Encounter for screening for lipoid disorders: Secondary | ICD-10-CM

## 2018-12-29 DIAGNOSIS — E559 Vitamin D deficiency, unspecified: Secondary | ICD-10-CM

## 2018-12-29 DIAGNOSIS — Z1389 Encounter for screening for other disorder: Secondary | ICD-10-CM

## 2018-12-29 DIAGNOSIS — Z1329 Encounter for screening for other suspected endocrine disorder: Secondary | ICD-10-CM

## 2018-12-29 DIAGNOSIS — E611 Iron deficiency: Secondary | ICD-10-CM

## 2018-12-29 DIAGNOSIS — R7303 Prediabetes: Secondary | ICD-10-CM

## 2018-12-29 DIAGNOSIS — Z0000 Encounter for general adult medical examination without abnormal findings: Secondary | ICD-10-CM

## 2018-12-29 NOTE — Telephone Encounter (Signed)
Left message for patient to return call back. [facility] may obtain information.  

## 2018-12-29 NOTE — Telephone Encounter (Signed)
Labs in call to schedule pt fasting labs    [facility]

## 2019-01-11 ENCOUNTER — Other Ambulatory Visit: Payer: Self-pay | Admitting: Internal Medicine

## 2019-01-11 DIAGNOSIS — J4520 Mild intermittent asthma, uncomplicated: Secondary | ICD-10-CM

## 2019-01-11 MED ORDER — BREO ELLIPTA 100-25 MCG/INH IN AEPB: INHALATION_SPRAY | RESPIRATORY_TRACT | 12 refills | Status: DC

## 2019-02-03 DIAGNOSIS — H35353 Cystoid macular degeneration, bilateral: Secondary | ICD-10-CM | POA: Diagnosis not present

## 2019-02-03 DIAGNOSIS — H35373 Puckering of macula, bilateral: Secondary | ICD-10-CM | POA: Diagnosis not present

## 2019-02-03 DIAGNOSIS — H2013 Chronic iridocyclitis, bilateral: Secondary | ICD-10-CM | POA: Diagnosis not present

## 2019-02-25 ENCOUNTER — Encounter: Payer: Federal, State, Local not specified - PPO | Admitting: Internal Medicine

## 2019-03-15 ENCOUNTER — Telehealth: Payer: Self-pay

## 2019-03-15 ENCOUNTER — Other Ambulatory Visit: Payer: Self-pay | Admitting: Internal Medicine

## 2019-03-15 NOTE — Telephone Encounter (Signed)
Copied from [place] [id number]. Topic: Referral - Request for Referral >> Mar 15, 2019 10:35 AM Sheran Luz wrote: Has patient seen PCP for this complaint? Yes  *If NO, is insurance requiring patient see PCP for this issue before PCP can refer them? Referral for which specialty:  obgyn  Preferred provider/office: [facility] [place]  Reason for referral: pap- would prefer to go to obgyn

## 2019-03-15 NOTE — Telephone Encounter (Signed)
Medication:  albuterol (PROAIR HFA) 108 (90 Base) MCG/ACT inhaler    Patient is requesting a refill of this medication   Pharmacy:  [id number] - [place], [place] [phone] (Phone) [phone] (Fax)

## 2019-03-15 NOTE — Telephone Encounter (Signed)
Pt would like referral to gyn to follow her paps. Would like to go to [facility]

## 2019-03-15 NOTE — Telephone Encounter (Signed)
Pap 12/11/16 negative pap She is not due until 12/12/2019 for pap, does she need referral now for issues going on now?   [place]

## 2019-03-15 NOTE — Telephone Encounter (Signed)
Requested medication (s) are due for refill today: yes  Requested medication (s) are on the active medication list: yes  Last refill:  01/20/2018 with 11 refills  Future visit scheduled: yes  Notes to clinic:per protocol unable to refill medication   Requested Prescriptions  Pending Prescriptions Disp Refills   albuterol (PROAIR HFA) 108 (90 Base) MCG/ACT inhaler      Sig: Inhale 2 puffs into the lungs every 4 (four) hours as needed.     Pulmonology:  Beta Agonists Failed - 03/15/2019 10:52 AM      Failed - One inhaler should last at least one month. If the patient is requesting refills earlier, contact the patient to check for uncontrolled symptoms.      Failed - Valid encounter within last 12 months    Recent Outpatient Visits          7 months ago Dizziness   [facility] [place], Yvetta Coder, [place]   1 year ago Annual visit for general adult medical examination with abnormal findings   [facility] [place] McLean-Scocuzza, Nino Glow, MD   2 years ago Routine general medical examination at a health care facility   [facility] Burnard Hawthorne, [place]   2 years ago Screen for colon cancer   [facility] [place], [place], [place]   3 years ago Routine general medical examination at a health care facility   [facility], Eduard Clos, MD      Future Appointments            In 1 month McLean-Scocuzza, Nino Glow, MD [facility], [facility]

## 2019-03-16 ENCOUNTER — Other Ambulatory Visit: Payer: Self-pay

## 2019-03-16 MED ORDER — ALBUTEROL SULFATE HFA 108 (90 BASE) MCG/ACT IN AERS: INHALATION_SPRAY | RESPIRATORY_TRACT | 11 refills | Status: DC

## 2019-03-16 NOTE — Telephone Encounter (Signed)
Also, rx refill for pro air sent in per patient request

## 2019-03-16 NOTE — Telephone Encounter (Signed)
Patient would like to wait until next year. She is not having issues.

## 2019-03-25 MED ORDER — ALBUTEROL SULFATE HFA 108 (90 BASE) MCG/ACT IN AERS: 2.0000 | INHALATION_SPRAY | RESPIRATORY_TRACT | 1 refills | Status: DC | PRN

## 2019-04-04 ENCOUNTER — Telehealth: Payer: Self-pay | Admitting: Internal Medicine

## 2019-04-04 NOTE — Telephone Encounter (Signed)
Pt called wanting to have a urine test lab for possible UTI and not have an appt. Pt states she's had UTI before. Please advise? Thank you!  Call pt @ [phone]. Thank you!

## 2019-04-05 ENCOUNTER — Other Ambulatory Visit: Payer: Self-pay

## 2019-04-05 ENCOUNTER — Other Ambulatory Visit: Payer: Self-pay | Admitting: Internal Medicine

## 2019-04-05 DIAGNOSIS — N3000 Acute cystitis without hematuria: Secondary | ICD-10-CM

## 2019-04-05 NOTE — Telephone Encounter (Signed)
Spoke with pt and she stated that she can not get off work for a lab appt so she stated that she will just give a urine when she comes in on Thursday for her appt.

## 2019-04-07 ENCOUNTER — Other Ambulatory Visit: Payer: Self-pay

## 2019-04-07 ENCOUNTER — Ambulatory Visit: Payer: Federal, State, Local not specified - PPO | Admitting: Internal Medicine

## 2019-04-07 ENCOUNTER — Encounter: Payer: Self-pay | Admitting: Internal Medicine

## 2019-04-07 VITALS — BP 130/72 | HR 78 | Temp 98.5°F | Ht 66.0 in | Wt 249.6 lb

## 2019-04-07 DIAGNOSIS — L309 Dermatitis, unspecified: Secondary | ICD-10-CM

## 2019-04-07 DIAGNOSIS — Z1231 Encounter for screening mammogram for malignant neoplasm of breast: Secondary | ICD-10-CM

## 2019-04-07 DIAGNOSIS — N3000 Acute cystitis without hematuria: Secondary | ICD-10-CM

## 2019-04-07 DIAGNOSIS — M1990 Unspecified osteoarthritis, unspecified site: Secondary | ICD-10-CM | POA: Diagnosis not present

## 2019-04-07 MED ORDER — MELOXICAM 15 MG PO TABS: ORAL_TABLET | ORAL | 1 refills | Status: DC

## 2019-04-07 MED ORDER — PHENAZOPYRIDINE HCL 200 MG PO TABS: 200.0000 mg | ORAL_TABLET | Freq: Three times a day (TID) | ORAL | 0 refills | Status: DC | PRN

## 2019-04-07 MED ORDER — CIPROFLOXACIN HCL 500 MG PO TABS: 500.0000 mg | ORAL_TABLET | Freq: Two times a day (BID) | ORAL | 0 refills | Status: DC

## 2019-04-07 MED ORDER — HYDROCORTISONE 2.5 % EX CREA: TOPICAL_CREAM | Freq: Two times a day (BID) | CUTANEOUS | 0 refills | Status: DC

## 2019-04-07 MED ORDER — HYDROCORTISONE 2.5 % EX CREA: TOPICAL_CREAM | Freq: Two times a day (BID) | CUTANEOUS | 11 refills | Status: DC

## 2019-04-07 MED ORDER — TRIAMCINOLONE ACETONIDE 0.1 % EX CREA: 1.0000 "application " | TOPICAL_CREAM | Freq: Two times a day (BID) | CUTANEOUS | 0 refills | Status: DC

## 2019-04-07 MED ORDER — TRIAMCINOLONE ACETONIDE 0.1 % EX CREA: 1.0000 "application " | TOPICAL_CREAM | Freq: Two times a day (BID) | CUTANEOUS | 1 refills | Status: DC

## 2019-04-07 NOTE — Patient Instructions (Signed)
Ambi for dark spots   cerave cream over body   All free or fragrance free laundry detergent   Gold bond talc free powder under breasts and under belly    Intertrigo Intertrigo is skin irritation or inflammation (dermatitis) that occurs when folds of skin rub together. The irritation can cause a rash and make skin raw and itchy. This condition most commonly occurs in the skin folds of these areas:  Toes.  Armpits.  Groin.  Under the belly.  Under the breasts.  Buttocks. Intertrigo is not passed from person to person (is not contagious). What are the causes? This condition is caused by heat, moisture, rubbing (friction), and not enough air circulation. The condition can be made worse by:  Sweat.  Bacteria.  A fungus, such as yeast. What increases the risk? This condition is more likely to occur if you have moisture in your skin folds. You are more likely to develop this condition if you:  Have diabetes.  Are overweight.  Are not able to move around or are not active.  Live in a warm and moist climate.  Wear splints, braces, or other medical devices.  Are not able to control your bowels or bladder (have incontinence). What are the signs or symptoms? Symptoms of this condition include:  A pink or red skin rash in the skin fold or near the skin fold.  Raw or scaly skin.  Itchiness.  A burning feeling.  Bleeding.  Leaking fluid.  A bad smell. How is this diagnosed? This condition is diagnosed with a medical history and physical exam. You may also have a skin swab to test for bacteria or a fungus. How is this treated? This condition may be treated by:  Cleaning and drying your skin.  Taking an antibiotic medicine or using an antibiotic skin cream for a bacterial infection.  Using an antifungal cream on your skin or taking pills for an infection that was caused by a fungus, such as yeast.  Using a steroid ointment to relieve itchiness and irritation.   Separating the skin fold with a clean cotton cloth to absorb moisture and allow air to flow into the area. Follow these instructions at home:  Keep the affected area clean and dry.  Do not scratch your skin.  Stay in a cool environment as much as possible. Use an air conditioner or fan, if available.  Apply over-the-counter and prescription medicines only as told by your health care provider.  If you were prescribed an antibiotic medicine, use it as told by your health care provider. Do not stop using the antibiotic even if your condition improves.  Keep all follow-up visits as told by your health care provider. This is important. How is this prevented?   Maintain a healthy weight.  Take care of your feet, especially if you have diabetes. Foot care includes: ? Wearing shoes that fit well. ? Keeping your feet dry. ? Wearing clean, breathable socks.  Protect the skin around your groin and buttocks, especially if you have incontinence. Skin protection includes: ? Following a regular cleaning routine. ? Using skin protectant creams, powders, or ointments. ? Changing protection pads frequently.  Do not wear tight clothes. Wear clothes that are loose, absorbent, and made of cotton.  Wear a bra that gives good support, if needed.  Shower and dry yourself well after activity or exercise. Use a hair dryer on a cool setting to dry between skin folds, especially after you bathe.  If you have diabetes,  keep your blood sugar under control. Contact a health care provider if:  Your symptoms do not improve with treatment.  Your symptoms get worse or they spread.  You notice increased redness and warmth.  You have a fever. Summary  Intertrigo is skin irritation or inflammation (dermatitis) that occurs when folds of skin rub together.  This condition is caused by heat, moisture, rubbing (friction), and not enough air circulation.  This condition may be treated by cleaning and  drying your skin and with medicines.  Apply over-the-counter and prescription medicines only as told by your health care provider.  Keep all follow-up visits as told by your health care provider. This is important. This information is not intended to replace advice given to you by your health care provider. Make sure you discuss any questions you have with your health care provider. Document Released: 07/07/2005 Document Revised: 12/07/2017 Document Reviewed: 12/07/2017 Elsevier Patient Education  2020 Elsevier Inc.  Eczema Eczema is a broad term for a group of skin conditions that cause skin to become rough and inflamed. Each type of eczema has different triggers, symptoms, and treatments. Eczema of any type is usually itchy and symptoms range from mild to severe. Eczema and its symptoms are not spread from person to person (are not contagious). It can appear on different parts of the body at different times. Your eczema may not look the same as someone else's eczema. What are the types of eczema? Atopic dermatitis This is a long-term (chronic) skin disease that keeps coming back (recurring). Usual symptoms are dry skin and small, solid pimples that may swell and leak fluid (weep). Contact dermatitis  This happens when something irritates the skin and causes a rash. The irritation can come from substances that you are allergic to (allergens), such as poison ivy, chemicals, or medicines that were applied to your skin. Dyshidrotic eczema This is a form of eczema on the hands and feet. It shows up as very itchy, fluid-filled blisters. It can affect people of any age, but is more common before age 98. Hand eczema  This causes very itchy areas of skin on the palms and sides of the hands and fingers. This type of eczema is common in industrial jobs where you may be exposed to many different types of irritants. Lichen simplex chronicus This type of eczema occurs when a person constantly scratches  one area of the body. Repeated scratching of the area leads to thickened skin (lichenification). Lichen simplex chronicus can occur along with other types of eczema. It is more common in adults, but may be seen in children as well. Nummular eczema This is a common type of eczema. It has no known cause. It typically causes a red, circular, crusty lesion (plaque) that may be itchy. Scratching may become a habit and can cause bleeding. Nummular eczema occurs most often in people of middle-age or older. It most often affects the hands. Seborrheic dermatitis This is a common skin disease that mainly affects the scalp. It may also affect any oily areas of the body, such as the face, sides of nose, eyebrows, ears, eyelids, and chest. It is marked by small scaling and redness of the skin (erythema). This can affect people of all ages. In infants, this condition is known as Location manager." Stasis dermatitis This is a common skin disease that usually appears on the legs and feet. It most often occurs in people who have a condition that prevents blood from being pumped through the veins in  the legs (chronic venous insufficiency). Stasis dermatitis is a chronic condition that needs long-term management. How is eczema diagnosed? Your health care provider will examine your skin and review your medical history. He or she may also give you skin patch tests. These tests involve taking patches that contain possible allergens and placing them on your back. He or she will then check in a few days to see if an allergic reaction occurred. What are the common treatments? Treatment for eczema is based on the type of eczema you have. Hydrocortisone steroid medicine can relieve itching quickly and help reduce inflammation. This medicine may be prescribed or obtained over-the-counter, depending on the strength of the medicine that is needed. Follow these instructions at home:  Take over-the-counter and prescription medicines only  as told by your health care provider.  Use creams or ointments to moisturize your skin. Do not use lotions.  Learn what triggers or irritates your symptoms. Avoid these things.  Treat symptom flare-ups quickly.  Do not itch your skin. This can make your rash worse.  Keep all follow-up visits as told by your health care provider. This is important. Where to find more information  The American Academy of Dermatology: InfoExam.si  The National Eczema Association: www.nationaleczema.org Contact a health care provider if:  You have serious itching, even with treatment.  You regularly scratch your skin until it bleeds.  Your rash looks different than usual.  Your skin is painful, swollen, or more red than usual.  You have a fever. Summary  There are eight general types of eczema. Each type has different triggers.  Eczema of any type causes itching that may range from mild to severe.  Treatment varies based on the type of eczema you have. Hydrocortisone steroid medicine can help with itching and inflammation.  Protecting your skin is the best way to prevent eczema. Use moisturizers and lotions. Avoid triggers and irritants, and treat flare-ups quickly. This information is not intended to replace advice given to you by your health care provider. Make sure you discuss any questions you have with your health care provider. Document Released: 11/20/2016 Document Revised: 06/19/2017 Document Reviewed: 11/20/2016 Elsevier Patient Education  2020 Elsevier Inc.  Urinary Tract Infection, Adult  A urinary tract infection (UTI) is an infection of any part of the urinary tract. The urinary tract includes the kidneys, ureters, bladder, and urethra. These organs make, store, and get rid of urine in the body. Your health care provider may use other names to describe the infection. An upper UTI affects the ureters and kidneys (pyelonephritis). A lower UTI affects the bladder (cystitis) and  urethra (urethritis). What are the causes? Most urinary tract infections are caused by bacteria in your genital area, around the entrance to your urinary tract (urethra). These bacteria grow and cause inflammation of your urinary tract. What increases the risk? You are more likely to develop this condition if:  You have a urinary catheter that stays in place (indwelling).  You are not able to control when you urinate or have a bowel movement (you have incontinence).  You are female and you: ? Use a spermicide or diaphragm for birth control. ? Have low estrogen levels. ? Are pregnant.  You have certain genes that increase your risk (genetics).  You are sexually active.  You take antibiotic medicines.  You have a condition that causes your flow of urine to slow down, such as: ? An enlarged prostate, if you are female. ? Blockage in your urethra (stricture). ?  A kidney stone. ? A nerve condition that affects your bladder control (neurogenic bladder). ? Not getting enough to drink, or not urinating often.  You have certain medical conditions, such as: ? Diabetes. ? A weak disease-fighting system (immunesystem). ? Sickle cell disease. ? Gout. ? Spinal cord injury. What are the signs or symptoms? Symptoms of this condition include:  Needing to urinate right away (urgently).  Frequent urination or passing small amounts of urine frequently.  Pain or burning with urination.  Blood in the urine.  Urine that smells bad or unusual.  Trouble urinating.  Cloudy urine.  Vaginal discharge, if you are female.  Pain in the abdomen or the lower back. You may also have:  Vomiting or a decreased appetite.  Confusion.  Irritability or tiredness.  A fever.  Diarrhea. The first symptom in older adults may be confusion. In some cases, they may not have any symptoms until the infection has worsened. How is this diagnosed? This condition is diagnosed based on your medical  history and a physical exam. You may also have other tests, including:  Urine tests.  Blood tests.  Tests for sexually transmitted infections (STIs). If you have had more than one UTI, a cystoscopy or imaging studies may be done to determine the cause of the infections. How is this treated? Treatment for this condition includes:  Antibiotic medicine.  Over-the-counter medicines to treat discomfort.  Drinking enough water to stay hydrated. If you have frequent infections or have other conditions such as a kidney stone, you may need to see a health care provider who specializes in the urinary tract (urologist). In rare cases, urinary tract infections can cause sepsis. Sepsis is a life-threatening condition that occurs when the body responds to an infection. Sepsis is treated in the hospital with IV antibiotics, fluids, and other medicines. Follow these instructions at home:  Medicines  Take over-the-counter and prescription medicines only as told by your health care provider.  If you were prescribed an antibiotic medicine, take it as told by your health care provider. Do not stop using the antibiotic even if you start to feel better. General instructions  Make sure you: ? Empty your bladder often and completely. Do not hold urine for long periods of time. ? Empty your bladder after sex. ? Wipe from front to back after a bowel movement if you are female. Use each tissue one time when you wipe.  Drink enough fluid to keep your urine pale yellow.  Keep all follow-up visits as told by your health care provider. This is important. Contact a health care provider if:  Your symptoms do not get better after 1-2 days.  Your symptoms go away and then return. Get help right away if you have:  Severe pain in your back or your lower abdomen.  A fever.  Nausea or vomiting. Summary  A urinary tract infection (UTI) is an infection of any part of the urinary tract, which includes the  kidneys, ureters, bladder, and urethra.  Most urinary tract infections are caused by bacteria in your genital area, around the entrance to your urinary tract (urethra).  Treatment for this condition often includes antibiotic medicines.  If you were prescribed an antibiotic medicine, take it as told by your health care provider. Do not stop using the antibiotic even if you start to feel better.  Keep all follow-up visits as told by your health care provider. This is important. This information is not intended to replace advice given to you  by your health care provider. Make sure you discuss any questions you have with your health care provider. Document Released: 04/16/2005 Document Revised: 06/24/2018 Document Reviewed: 01/14/2018 Elsevier Patient Education  2020 [employer]

## 2019-04-07 NOTE — Progress Notes (Signed)
Chief Complaint  Patient presents with  . Urinary Tract Infection   Acute visit  1. UTI blood in urine x 2 days burning at end of stream and thinks she has a UTI  2. Eczema to chest and legs not using anything but dove soap and cerave. C/o skin irritation under stomach and breasts 3. Arthritis in knees wants refill of mobic    Review of Systems  Constitutional: Negative for weight loss.  HENT: Negative for hearing loss.   Eyes: Negative for blurred vision.  Respiratory: Negative for shortness of breath.   Cardiovascular: Negative for chest pain.  Gastrointestinal: Negative for abdominal pain.  Genitourinary: Positive for dysuria.  Skin: Positive for itching and rash.  Neurological: Negative for headaches.  Psychiatric/Behavioral: Negative for depression.   Past Medical History:  Diagnosis Date  . Anemia    iron infusions in past  . Arthritis    right knee (also torn meniscus)  . Childhood asthma   . Eczema   . Iritis    Followed by Dr. Joya San  . Motion sickness    back seat cars  . Prediabetes   . Urinary tract bacterial infections    Past Surgical History:  Procedure Laterality Date  . BREAST SURGERY  1988   benign  . CATARACT EXTRACTION W/ INTRAOCULAR LENS IMPLANT Left    b/l eye surgery for cataracts   . COLONOSCOPY WITH PROPOFOL N/A 03/05/2017   Procedure: COLONOSCOPY WITH PROPOFOL;  Surgeon: Lucilla Lame, MD;  Location: [facility];  Service: Endoscopy;  Laterality: N/A;  . VAGINAL DELIVERY     2   Family History  Problem Relation Age of Onset  . Lung cancer Maternal Aunt   . Alcohol abuse Maternal Aunt   . Clotting disorder Mother   . Arthritis Mother   . Heart disease Mother   . Bipolar disorder Brother   . Stroke Maternal Grandmother   . Cancer Maternal Aunt        lung  . Asthma Son    Social History   Socioeconomic History  . Marital status: Single    Spouse name: Not on file  . Number of children: 2  . Years of education: Not on  file  . Highest education level: Not on file  Occupational History  . Occupation: Mail carrier  Social Needs  . Financial resource strain: Not on file  . Food insecurity    Worry: Not on file    Inability: Not on file  . Transportation needs    Medical: Not on file    Non-medical: Not on file  Tobacco Use  . Smoking status: Never Smoker  . Smokeless tobacco: Never Used  Substance and Sexual Activity  . Alcohol use: Yes    Alcohol/week: 11.0 standard drinks    Types: 4 Standard drinks or equivalent, 7 Glasses of wine per week    Comment: Wine  . Drug use: No  . Sexual activity: Not on file  Lifestyle  . Physical activity    Days per week: Not on file    Minutes per session: Not on file  . Stress: Not on file  Relationships  . Social Herbalist on phone: Not on file    Gets together: Not on file    Attends religious service: Not on file    Active member of club or organization: Not on file    Attends meetings of clubs or organizations: Not on file    Relationship status:  Not on file  . Intimate partner violence    Fear of current or ex partner: Not on file    Emotionally abused: Not on file    Physically abused: Not on file    Forced sexual activity: Not on file  Other Topics Concern  . Not on file  Social History Narrative   Lives in [place] with sons, from [place].      Work - Facilities manager, Marine scientist   Diet - regular   Exercise - walks with work   Current Meds  Medication Sig  . albuterol (PROAIR HFA) 108 (90 Base) MCG/ACT inhaler Inhale 2 puffs into the lungs every 4 (four) hours as needed.  Marland Kitchen albuterol (PROAIR HFA) 108 (90 Base) MCG/ACT inhaler Inhale 2 puffs into the lungs every 4 hours as needed  . cetirizine (ZYRTEC) 10 MG tablet Take 1 tablet (10 mg total) by mouth daily.  . DUREZOL 0.05 % EMUL PLACE 1 DROP b/l bid  . ferrous sulfate 325 (65 FE) MG tablet Take 325 mg by mouth daily with breakfast.  . fluticasone (FLONASE) 50 MCG/ACT nasal spray  Place 2 sprays into both nostrils daily.  . fluticasone furoate-vilanterol (BREO ELLIPTA) 100-25 MCG/INH AEPB TAKE 1 PUFF BY MOUTH EVERY DAY rinse mouth  . ibuprofen (ADVIL,MOTRIN) 800 MG tablet Take 1 tablet (800 mg total) by mouth 3 (three) times daily.  Marland Kitchen ibuprofen (ADVIL,MOTRIN) 800 MG tablet Take 0.5 tablets (400 mg total) by mouth every 8 (eight) hours as needed.  . meloxicam (MOBIC) 15 MG tablet meloxicam 15 mg tablet  TAKE 1 TABLET(S)daily prn DO NOT TAKE WITH IBUPROFEN  . [DISCONTINUED] benzonatate (TESSALON) 200 MG capsule Take one cap TID PRN cough  . [DISCONTINUED] meloxicam (MOBIC) 15 MG tablet meloxicam 15 mg tablet  TAKE 1 TABLET(S) EVERY DAY BY ORAL ROUTE.  . [DISCONTINUED] meloxicam (MOBIC) 15 MG tablet meloxicam 15 mg tablet  TAKE 1 TABLET(S) EVERY DAY BY ORAL ROUTE.  . [DISCONTINUED] metroNIDAZOLE (FLAGYL) 500 MG tablet Take 1 tablet (500 mg total) by mouth 2 (two) times daily. With food  . [DISCONTINUED] oseltamivir (TAMIFLU) 75 MG capsule Take 1 capsule (75 mg total) by mouth every 12 (twelve) hours.   Allergies  Allergen Reactions  . Other Hives    Allergic to tree nuts, but not to peanuts  . Penicillins Hives  . Tree Extract Hives  . Amoxicillin Rash   No results found for this or any previous visit (from the past 2160 hour(s)). Objective  Body mass index is 40.29 kg/m. Wt Readings from Last 3 Encounters:  04/07/19 249 lb 9.6 oz (113.2 kg)  07/26/18 248 lb 12.8 oz (112.9 kg)  07/22/18 240 lb (108.9 kg)   Temp Readings from Last 3 Encounters:  04/07/19 98.5 F (36.9 C) (Oral)  07/26/18 98 F (36.7 C)  07/22/18 98.9 F (37.2 C) (Oral)   BP Readings from Last 3 Encounters:  04/07/19 130/72  07/26/18 122/72  07/22/18 118/82   Pulse Readings from Last 3 Encounters:  04/07/19 78  07/26/18 98  07/22/18 96    Physical Exam Vitals signs and nursing note reviewed.  Constitutional:      Appearance: Normal appearance. She is well-developed and  well-groomed. She is morbidly obese.  HENT:     Head: Normocephalic and atraumatic.     Comments: +mask on   Cardiovascular:     Rate and Rhythm: Normal rate and regular rhythm.     Heart sounds: Normal heart sounds.  Pulmonary:  Effort: Pulmonary effort is normal.     Breath sounds: Normal breath sounds.  Abdominal:     Tenderness: There is no abdominal tenderness. There is no right CVA tenderness or left CVA tenderness.  Skin:    General: Skin is warm and dry.     Findings: Rash present.     Comments: Eczematous changes right chest >neck and legs R>L  intertrigo breast/pannus   Neurological:     General: No focal deficit present.     Mental Status: She is alert and oriented to person, place, and time. Mental status is at baseline.     Gait: Gait normal.  Psychiatric:        Attention and Perception: Attention and perception normal.        Mood and Affect: Mood and affect normal.        Speech: Speech normal.        Behavior: Behavior normal. Behavior is cooperative.        Thought Content: Thought content normal.        Cognition and Memory: Cognition normal.        Judgment: Judgment normal.     Assessment  Plan  Acute cystitis without hematuria - Plan: ciprofloxacin (CIPRO) 500 MG tablet bid x 5 days with pyridium  Urinalysis, Routine w reflex microscopic, Urine Culture Repeat UA with upcoming labs as well   Arthritis - Plan: meloxicam (MOBIC) 15 MG tablet dont take with ibuprofen   Eczema, unspecified type  TMC legs and chest and HC neck and face prn  cerave cream rec.  Rec frag free laundry detergent  Cont dove  HM=physical at f/u  declines flu shot today utd Tdap immuen mmr and hep B status  Disc shingrix at f/u  Breast exam today referral mammo [facility] 3 d due 02/26/18 referred today again for 2020    Pap 12/11/16 neg pap neg hpv Colonoscopy 03/05/17 [facility] repeat in 10 years Dr. Allen Norris  Disc exercise to lose and  Healthy diet options  Provider:  Dr. Olivia Mackie McLean-Scocuzza-[facility]

## 2019-04-08 LAB — URINE CULTURE
MICRO NUMBER:: 893280
SPECIMEN QUALITY:: ADEQUATE

## 2019-04-08 LAB — URINALYSIS, ROUTINE W REFLEX MICROSCOPIC
Bilirubin Urine: NEGATIVE
Glucose, UA: NEGATIVE
Hyaline Cast: NONE SEEN /LPF
Ketones, ur: NEGATIVE
Nitrite: POSITIVE — AB
Specific Gravity, Urine: 1.02 (ref 1.001–1.03)
pH: 7 (ref 5.0–8.0)

## 2019-04-11 ENCOUNTER — Other Ambulatory Visit: Payer: Self-pay | Admitting: Internal Medicine

## 2019-04-11 DIAGNOSIS — R300 Dysuria: Secondary | ICD-10-CM

## 2019-04-14 ENCOUNTER — Other Ambulatory Visit: Payer: Self-pay | Admitting: Internal Medicine

## 2019-04-14 DIAGNOSIS — R300 Dysuria: Secondary | ICD-10-CM

## 2019-04-15 ENCOUNTER — Other Ambulatory Visit: Payer: Self-pay | Admitting: Internal Medicine

## 2019-04-15 DIAGNOSIS — N029 Recurrent and persistent hematuria with unspecified morphologic changes: Secondary | ICD-10-CM

## 2019-04-24 LAB — HM MAMMOGRAPHY

## 2019-04-26 ENCOUNTER — Telehealth: Payer: Self-pay | Admitting: *Deleted

## 2019-04-26 ENCOUNTER — Other Ambulatory Visit: Payer: Self-pay

## 2019-04-26 ENCOUNTER — Other Ambulatory Visit (HOSPITAL_COMMUNITY)
Admission: RE | Admit: 2019-04-26 | Discharge: 2019-04-26 | Disposition: A | Discharge status: Home / Self Care | Payer: Federal, State, Local not specified - PPO | Source: Ambulatory Visit | Attending: Internal Medicine | Admitting: Internal Medicine

## 2019-04-26 ENCOUNTER — Other Ambulatory Visit (INDEPENDENT_AMBULATORY_CARE_PROVIDER_SITE_OTHER): Payer: Federal, State, Local not specified - PPO

## 2019-04-26 DIAGNOSIS — Z13220 Encounter for screening for lipoid disorders: Secondary | ICD-10-CM

## 2019-04-26 DIAGNOSIS — R300 Dysuria: Secondary | ICD-10-CM | POA: Diagnosis not present

## 2019-04-26 DIAGNOSIS — Z0000 Encounter for general adult medical examination without abnormal findings: Secondary | ICD-10-CM | POA: Diagnosis not present

## 2019-04-26 DIAGNOSIS — E559 Vitamin D deficiency, unspecified: Secondary | ICD-10-CM | POA: Diagnosis not present

## 2019-04-26 DIAGNOSIS — R7303 Prediabetes: Secondary | ICD-10-CM

## 2019-04-26 DIAGNOSIS — E611 Iron deficiency: Secondary | ICD-10-CM

## 2019-04-26 DIAGNOSIS — Z1329 Encounter for screening for other suspected endocrine disorder: Secondary | ICD-10-CM

## 2019-04-26 LAB — CBC WITH DIFFERENTIAL/PLATELET
Basophils Absolute: 0 10*3/uL (ref 0.0–0.1)
Basophils Relative: 0.8 % (ref 0.0–3.0)
Eosinophils Absolute: 0.1 10*3/uL (ref 0.0–0.7)
Eosinophils Relative: 3.7 % (ref 0.0–5.0)
HCT: 38.3 % (ref 36.0–46.0)
Hemoglobin: 12.2 g/dL (ref 12.0–15.0)
Lymphocytes Relative: 54.7 % — ABNORMAL HIGH (ref 12.0–46.0)
Lymphs Abs: 1.8 10*3/uL (ref 0.7–4.0)
MCHC: 31.8 g/dL (ref 30.0–36.0)
MCV: 82.2 fl (ref 78.0–100.0)
Monocytes Absolute: 0.3 10*3/uL (ref 0.1–1.0)
Monocytes Relative: 7.8 % (ref 3.0–12.0)
Neutro Abs: 1.1 10*3/uL — ABNORMAL LOW (ref 1.4–7.7)
Neutrophils Relative %: 33 % — ABNORMAL LOW (ref 43.0–77.0)
Platelets: 223 10*3/uL (ref 150.0–400.0)
RBC: 4.66 Mil/uL (ref 3.87–5.11)
RDW: 14.7 % (ref 11.5–15.5)
WBC: 3.3 10*3/uL — ABNORMAL LOW (ref 4.0–10.5)

## 2019-04-26 LAB — LIPID PANEL
Cholesterol: 150 mg/dL (ref 0–200)
HDL: 56.5 mg/dL (ref >39.00)
LDL Cholesterol: 85 mg/dL (ref 0–99)
NonHDL: 93.77
Total CHOL/HDL Ratio: 3
Triglycerides: 42 mg/dL (ref 0.0–149.0)
VLDL: 8.4 mg/dL (ref 0.0–40.0)

## 2019-04-26 LAB — COMPREHENSIVE METABOLIC PANEL
ALT: 13 U/L (ref 0–35)
AST: 18 U/L (ref 0–37)
Albumin: 3.9 g/dL (ref 3.5–5.2)
Alkaline Phosphatase: 74 U/L (ref 39–117)
BUN: 15 mg/dL (ref 6–23)
CO2: 28 mEq/L (ref 19–32)
Calcium: 9 mg/dL (ref 8.4–10.5)
Chloride: 105 mEq/L (ref 96–112)
Creatinine, Ser: 0.61 mg/dL (ref 0.40–1.20)
GFR: 123.67 mL/min (ref >60.00)
Glucose, Bld: 98 mg/dL (ref 70–99)
Potassium: 4 mEq/L (ref 3.5–5.1)
Sodium: 140 mEq/L (ref 135–145)
Total Bilirubin: 0.4 mg/dL (ref 0.2–1.2)
Total Protein: 7 g/dL (ref 6.0–8.3)

## 2019-04-26 LAB — VITAMIN D 25 HYDROXY (VIT D DEFICIENCY, FRACTURES): VITD: 25.18 ng/mL — ABNORMAL LOW (ref 30.00–100.00)

## 2019-04-26 LAB — TSH: TSH: 1.13 u[IU]/mL (ref 0.35–4.50)

## 2019-04-26 LAB — HEMOGLOBIN A1C: Hgb A1c MFr Bld: 6.1 % (ref 4.6–6.5)

## 2019-04-26 NOTE — Telephone Encounter (Signed)
Copied from [place] [phone]. Topic: General - Other >> Apr 26, 2019  9:09 AM Leward Quan A wrote: Reason for CRM: Patient called on her way for labs will be about 10 minutes late.

## 2019-04-27 LAB — IRON,TIBC AND FERRITIN PANEL
%SAT: 19 % (calc) (ref 16–45)
Ferritin: 132 ng/mL (ref 16–232)
Iron: 42 ug/dL — ABNORMAL LOW (ref 45–160)
TIBC: 227 mcg/dL (calc) — ABNORMAL LOW (ref 250–450)

## 2019-04-28 ENCOUNTER — Ambulatory Visit: Payer: Federal, State, Local not specified - PPO

## 2019-05-03 ENCOUNTER — Other Ambulatory Visit: Payer: Self-pay

## 2019-05-04 ENCOUNTER — Encounter: Payer: Self-pay | Admitting: Internal Medicine

## 2019-05-04 ENCOUNTER — Other Ambulatory Visit: Payer: Self-pay

## 2019-05-04 ENCOUNTER — Ambulatory Visit
Admission: RE | Admit: 2019-05-04 | Discharge: 2019-05-04 | Disposition: A | Discharge status: Home / Self Care | Payer: Federal, State, Local not specified - PPO | Source: Ambulatory Visit | Attending: Internal Medicine | Admitting: Internal Medicine

## 2019-05-04 ENCOUNTER — Ambulatory Visit (INDEPENDENT_AMBULATORY_CARE_PROVIDER_SITE_OTHER): Payer: Federal, State, Local not specified - PPO | Admitting: Internal Medicine

## 2019-05-04 VITALS — BP 110/68 | HR 82 | Temp 97.9°F | Ht 66.0 in | Wt 244.6 lb

## 2019-05-04 DIAGNOSIS — B9689 Other specified bacterial agents as the cause of diseases classified elsewhere: Secondary | ICD-10-CM | POA: Insufficient documentation

## 2019-05-04 DIAGNOSIS — Z0000 Encounter for general adult medical examination without abnormal findings: Secondary | ICD-10-CM | POA: Diagnosis not present

## 2019-05-04 DIAGNOSIS — R319 Hematuria, unspecified: Secondary | ICD-10-CM

## 2019-05-04 DIAGNOSIS — Z23 Encounter for immunization: Secondary | ICD-10-CM | POA: Diagnosis not present

## 2019-05-04 DIAGNOSIS — E611 Iron deficiency: Secondary | ICD-10-CM | POA: Insufficient documentation

## 2019-05-04 DIAGNOSIS — R7303 Prediabetes: Secondary | ICD-10-CM

## 2019-05-04 DIAGNOSIS — N760 Acute vaginitis: Secondary | ICD-10-CM | POA: Diagnosis not present

## 2019-05-04 DIAGNOSIS — N029 Recurrent and persistent hematuria with unspecified morphologic changes: Secondary | ICD-10-CM | POA: Insufficient documentation

## 2019-05-04 DIAGNOSIS — I83819 Varicose veins of unspecified lower extremities with pain: Secondary | ICD-10-CM

## 2019-05-04 DIAGNOSIS — D72819 Decreased white blood cell count, unspecified: Secondary | ICD-10-CM

## 2019-05-04 DIAGNOSIS — Z1329 Encounter for screening for other suspected endocrine disorder: Secondary | ICD-10-CM

## 2019-05-04 DIAGNOSIS — D509 Iron deficiency anemia, unspecified: Secondary | ICD-10-CM

## 2019-05-04 DIAGNOSIS — I83899 Varicose veins of unspecified lower extremities with other complications: Secondary | ICD-10-CM

## 2019-05-04 DIAGNOSIS — Z13220 Encounter for screening for lipoid disorders: Secondary | ICD-10-CM

## 2019-05-04 DIAGNOSIS — N200 Calculus of kidney: Secondary | ICD-10-CM | POA: Diagnosis not present

## 2019-05-04 DIAGNOSIS — E559 Vitamin D deficiency, unspecified: Secondary | ICD-10-CM

## 2019-05-04 MED ORDER — METRONIDAZOLE 0.75 % VA GEL: 1.0000 | Freq: Every day | VAGINAL | 1 refills | Status: DC

## 2019-05-04 NOTE — Progress Notes (Addendum)
Chief Complaint  Patient presents with  . Annual Exam   Annual doing well  1urine cytology + BV wants to tx topically   2. Reviewed labs with prediabetes, iron def and low WBC she has responded to iron infusions in the past will refer to Dr. Tasia Catchings hematology further w/u   3. Rash/eczema to legs better with [facility] ream   Review of Systems  Constitutional: Negative for weight loss.  HENT: Negative for hearing loss.   Eyes: Positive for blurred vision.       Right eye vision blurry    Respiratory: Negative for shortness of breath.   Cardiovascular: Negative for chest pain.  Gastrointestinal: Negative for abdominal pain.  Musculoskeletal: Negative for falls.  Skin: Negative for rash.  Neurological: Negative for headaches.  Psychiatric/Behavioral: Negative for depression.   Past Medical History:  Diagnosis Date  . Anemia    iron infusions in past  . Arthritis    right knee (also torn meniscus)  . Childhood asthma   . Eczema   . Iritis    Followed by Dr. Joya San  . Motion sickness    back seat cars  . Prediabetes   . Urinary tract bacterial infections    Past Surgical History:  Procedure Laterality Date  . BREAST SURGERY  1988   benign  . CATARACT EXTRACTION W/ INTRAOCULAR LENS IMPLANT Left    b/l eye surgery for cataracts b/l right eye blurry after surgery   . COLONOSCOPY WITH PROPOFOL N/A 03/05/2017   Procedure: COLONOSCOPY WITH PROPOFOL;  Surgeon: Lucilla Lame, MD;  Location: [facility];  Service: Endoscopy;  Laterality: N/A;  . VAGINAL DELIVERY     2   Family History  Problem Relation Age of Onset  . Lung cancer Maternal Aunt   . Alcohol abuse Maternal Aunt   . Clotting disorder Mother   . Arthritis Mother   . Heart disease Mother   . Bipolar disorder Brother   . Stroke Maternal Grandmother   . Cancer Maternal Aunt        lung  . Asthma Son    Social History   Socioeconomic History  . Marital status: Single    Spouse name: Not on file  . Number of  children: 2  . Years of education: Not on file  . Highest education level: Not on file  Occupational History  . Occupation: Mail carrier  Social Needs  . Financial resource strain: Not on file  . Food insecurity    Worry: Not on file    Inability: Not on file  . Transportation needs    Medical: Not on file    Non-medical: Not on file  Tobacco Use  . Smoking status: Never Smoker  . Smokeless tobacco: Never Used  Substance and Sexual Activity  . Alcohol use: Yes    Alcohol/week: 11.0 standard drinks    Types: 4 Standard drinks or equivalent, 7 Glasses of wine per week    Comment: Wine  . Drug use: No  . Sexual activity: Not on file  Lifestyle  . Physical activity    Days per week: Not on file    Minutes per session: Not on file  . Stress: Not on file  Relationships  . Social Herbalist on phone: Not on file    Gets together: Not on file    Attends religious service: Not on file    Active member of club or organization: Not on file    Attends  meetings of clubs or organizations: Not on file    Relationship status: Not on file  . Intimate partner violence    Fear of current or ex partner: Not on file    Emotionally abused: Not on file    Physically abused: Not on file    Forced sexual activity: Not on file  Other Topics Concern  . Not on file  Social History Narrative   Lives in [place] with sons, from [place].      Work - Facilities manager, Marine scientist   Diet - regular   Exercise - walks with work   Current Meds  Medication Sig  . albuterol (PROAIR HFA) 108 (90 Base) MCG/ACT inhaler Inhale 2 puffs into the lungs every 4 (four) hours as needed.  Marland Kitchen albuterol (PROAIR HFA) 108 (90 Base) MCG/ACT inhaler Inhale 2 puffs into the lungs every 4 hours as needed  . cetirizine (ZYRTEC) 10 MG tablet Take 1 tablet (10 mg total) by mouth daily.  . ciprofloxacin (CIPRO) 500 MG tablet Take 1 tablet (500 mg total) by mouth 2 (two) times daily.  . DUREZOL 0.05 % EMUL PLACE 1 DROP b/l  bid  . ferrous sulfate 325 (65 FE) MG tablet Take 325 mg by mouth daily with breakfast.  . fluticasone (FLONASE) 50 MCG/ACT nasal spray Place 2 sprays into both nostrils daily.  . fluticasone furoate-vilanterol (BREO ELLIPTA) 100-25 MCG/INH AEPB TAKE 1 PUFF BY MOUTH EVERY DAY rinse mouth  . hydrocortisone 2.5 % cream Apply topically 2 (two) times daily. Face and neck  . ibuprofen (ADVIL,MOTRIN) 800 MG tablet Take 1 tablet (800 mg total) by mouth 3 (three) times daily.  Marland Kitchen ibuprofen (ADVIL,MOTRIN) 800 MG tablet Take 0.5 tablets (400 mg total) by mouth every 8 (eight) hours as needed.  . meloxicam (MOBIC) 15 MG tablet meloxicam 15 mg tablet  TAKE 1 TABLET(S)daily prn DO NOT TAKE WITH IBUPROFEN  . phenazopyridine (PYRIDIUM) 200 MG tablet Take 1 tablet (200 mg total) by mouth 3 (three) times daily as needed for pain.  Marland Kitchen triamcinolone cream (KENALOG) 0.1 % Apply 1 application topically 2 (two) times daily. As needed   Allergies  Allergen Reactions  . Other Hives    Allergic to tree nuts, but not to peanuts  . Penicillins Hives  . Tree Extract Hives  . Amoxicillin Rash   Recent Results (from the past 2160 hour(s))  Urinalysis, Routine w reflex microscopic     Status: Abnormal   Collection Time: 04/07/19  4:18 PM  Result Value Ref Range   Color, Urine YELLOW YELLOW   APPearance CLOUDY (A) CLEAR   Specific Gravity, Urine 1.020 1.001 - 1.03   pH 7.0 5.0 - 8.0   Glucose, UA NEGATIVE NEGATIVE   Bilirubin Urine NEGATIVE NEGATIVE   Ketones, ur NEGATIVE NEGATIVE   Hgb urine dipstick 3+ (A) NEGATIVE   Protein, ur 1+ (A) NEGATIVE   Nitrite POSITIVE (A) NEGATIVE   Leukocytes,Ua 2+ (A) NEGATIVE   WBC, UA 6-10 (A) 0 - 5 /HPF   RBC / HPF 3-10 (A) 0 - 2 /HPF   Squamous Epithelial / LPF 0-5 < OR = 5 /HPF   Bacteria, UA FEW (A) NONE SEEN /HPF   Hyaline Cast NONE SEEN NONE SEEN /LPF  Urine Culture     Status: None   Collection Time: 04/07/19  4:18 PM   Specimen: Urine  Result Value Ref Range    MICRO NUMBER: [id number]    SPECIMEN QUALITY: Adequate    Sample  Source NOT GIVEN    STATUS: FINAL    ISOLATE 1:      Less than 10,000 CFU/mL of single Gram positive organism isolated. No further testing will be performed. If clinically indicated, recollection using a method to minimize contamination, with prompt transfer to Urine Culture Transport Tube, is recommended.  Iron, TIBC and Ferritin Panel     Status: Abnormal   Collection Time: 04/26/19  9:28 AM  Result Value Ref Range   Iron 42 (L) 45 - 160 mcg/dL   TIBC 227 (L) 250 - 450 mcg/dL (calc)   %SAT 19 16 - 45 % (calc)   Ferritin 132 16 - 232 ng/mL  Hemoglobin A1c     Status: None   Collection Time: 04/26/19  9:28 AM  Result Value Ref Range   Hgb A1c MFr Bld 6.1 4.6 - 6.5 %    Comment: Glycemic Control Guidelines for People with Diabetes:Non Diabetic:  <6%Goal of Therapy: <7%Additional Action Suggested:  >8%   Vitamin D (25 hydroxy)     Status: Abnormal   Collection Time: 04/26/19  9:28 AM  Result Value Ref Range   VITD 25.18 (L) 30.00 - 100.00 ng/mL  TSH     Status: None   Collection Time: 04/26/19  9:28 AM  Result Value Ref Range   TSH 1.13 0.35 - 4.50 uIU/mL  Lipid panel     Status: None   Collection Time: 04/26/19  9:28 AM  Result Value Ref Range   Cholesterol 150 0 - 200 mg/dL    Comment: ATP III Classification       Desirable:  < 200 mg/dL               Borderline High:  200 - 239 mg/dL          High:  > = 240 mg/dL   Triglycerides 42.0 0.0 - 149.0 mg/dL    Comment: Normal:  <150 mg/dLBorderline High:  150 - 199 mg/dL   HDL 56.50 >39.00 mg/dL   VLDL 8.4 0.0 - 40.0 mg/dL   LDL Cholesterol 85 0 - 99 mg/dL   Total CHOL/HDL Ratio 3     Comment:                Men          Women1/2 Average Risk     3.4          3.3Average Risk          5.0          4.42X Average Risk          9.6          7.13X Average Risk          15.0          11.0                       NonHDL 93.77     Comment: NOTE:  Non-HDL goal should be 30 mg/dL  higher than patient's LDL goal (i.e. LDL goal of < 70 mg/dL, would have non-HDL goal of < 100 mg/dL)  CBC w/Diff     Status: Abnormal   Collection Time: 04/26/19  9:28 AM  Result Value Ref Range   WBC 3.3 (L) 4.0 - 10.5 K/uL   RBC 4.66 3.87 - 5.11 Mil/uL   Hemoglobin 12.2 12.0 - 15.0 g/dL   HCT 38.3 36.0 - 46.0 %   MCV 82.2 78.0 - 100.0  fl   MCHC 31.8 30.0 - 36.0 g/dL   RDW 14.7 11.5 - 15.5 %   Platelets 223.0 150.0 - 400.0 K/uL   Neutrophils Relative % 33.0 (L) 43.0 - 77.0 %   Lymphocytes Relative 54.7 (H) 12.0 - 46.0 %   Monocytes Relative 7.8 3.0 - 12.0 %   Eosinophils Relative 3.7 0.0 - 5.0 %   Basophils Relative 0.8 0.0 - 3.0 %   Neutro Abs 1.1 (L) 1.4 - 7.7 K/uL   Lymphs Abs 1.8 0.7 - 4.0 K/uL   Monocytes Absolute 0.3 0.1 - 1.0 K/uL   Eosinophils Absolute 0.1 0.0 - 0.7 K/uL   Basophils Absolute 0.0 0.0 - 0.1 K/uL  Comprehensive metabolic panel     Status: None   Collection Time: 04/26/19  9:28 AM  Result Value Ref Range   Sodium 140 135 - 145 mEq/L   Potassium 4.0 3.5 - 5.1 mEq/L   Chloride 105 96 - 112 mEq/L   CO2 28 19 - 32 mEq/L   Glucose, Bld 98 70 - 99 mg/dL   BUN 15 6 - 23 mg/dL   Creatinine, Ser 0.61 0.40 - 1.20 mg/dL   Total Bilirubin 0.4 0.2 - 1.2 mg/dL   Alkaline Phosphatase 74 39 - 117 U/L   AST 18 0 - 37 U/L   ALT 13 0 - 35 U/L   Total Protein 7.0 6.0 - 8.3 g/dL   Albumin 3.9 3.5 - 5.2 g/dL   Calcium 9.0 8.4 - 10.5 mg/dL   GFR 123.67 >60.00 mL/min   Objective  Body mass index is 39.48 kg/m. Wt Readings from Last 3 Encounters:  05/04/19 244 lb 9.6 oz (110.9 kg)  04/07/19 249 lb 9.6 oz (113.2 kg)  07/26/18 248 lb 12.8 oz (112.9 kg)   Temp Readings from Last 3 Encounters:  05/04/19 97.9 F (36.6 C) (Skin)  04/07/19 98.5 F (36.9 C) (Oral)  07/26/18 98 F (36.7 C)   BP Readings from Last 3 Encounters:  05/04/19 110/68  04/07/19 130/72  07/26/18 122/72   Pulse Readings from Last 3 Encounters:  05/04/19 82  04/07/19 78  07/26/18 98     Physical Exam Vitals signs and nursing note reviewed.  Constitutional:      Appearance: Normal appearance. She is well-developed and well-groomed. She is obese.  HENT:     Head: Normocephalic and atraumatic.     Comments: +mask on   Eyes:     Conjunctiva/sclera: Conjunctivae normal.     Pupils: Pupils are equal, round, and reactive to light.  Cardiovascular:     Rate and Rhythm: Normal rate and regular rhythm.     Heart sounds: Normal heart sounds. No murmur.  Pulmonary:     Effort: Pulmonary effort is normal.     Breath sounds: Normal breath sounds.  Skin:    General: Skin is warm and dry.  Neurological:     General: No focal deficit present.     Mental Status: She is alert and oriented to person, place, and time. Mental status is at baseline.     Gait: Gait normal.  Psychiatric:        Attention and Perception: Attention and perception normal.        Mood and Affect: Mood and affect normal.        Speech: Speech normal.        Behavior: Behavior normal. Behavior is cooperative.        Thought Content: Thought content normal.  Cognition and Memory: Cognition and memory normal.        Judgment: Judgment normal.     Assessment  Plan  Annual physical exam flu shot today utd Tdap immune mmr Not immune hep B status consider new vx in future  Disc shingrix today declines 05/04/2019 for now    referral mammo [facility] 3 d due 2020 pt to call and schedule  Pap 12/11/16 neg pap neg hpv Colonoscopy 03/05/17 [facility] repeat in 5 years Dr. Allen Norris   Iron deficiency - Plan: Ambulatory referral to Hematology Leukopenia, unspecified type - Plan: Ambulatory referral to Hematology Dr. Tasia Catchings  rec iron infusions and further w/u if needed low wbc and iron def  utd colonoscopy   Bacterial vaginitis - Plan: metroNIDAZOLE (METROGEL) 0.75 % vaginal gel x 7-10 days    [facility] 09/22/19 right distal radius fx  10/04/19  [facility] conservative tx short arm cast x 3 weeks  then velcro splint then OT Provider: Dr. Olivia Mackie McLean-Scocuzza-[facility]

## 2019-05-04 NOTE — Patient Instructions (Addendum)
D3 5000 IU daily over the counter   Dr. Cathie HoopsYu blood specialist  [address] [phone]  Please schedule mammogram   Gold bond talc free powder in creases to keep you dry   Think about shingrix  Recombinant Zoster (Shingles) Vaccine: What You Need to Know 1. Why get vaccinated? Recombinant zoster (shingles) vaccine can prevent shingles. Shingles (also called herpes zoster, or just zoster) is a painful skin rash, usually with blisters. In addition to the rash, shingles can cause fever, headache, chills, or upset stomach. More rarely, shingles can lead to pneumonia, hearing problems, blindness, brain inflammation (encephalitis), or death. The most common complication of shingles is long-term nerve pain called postherpetic neuralgia (PHN). PHN occurs in the areas where the shingles rash was, even after the rash clears up. It can last for months or years after the rash goes away. The pain from PHN can be severe and debilitating. About 10 to 18% of people who get shingles will experience PHN. The risk of PHN increases with age. An older adult with shingles is more likely to develop PHN and have longer lasting and more severe pain than a younger person with shingles. Shingles is caused by the varicella zoster virus, the same virus that causes chickenpox. After you have chickenpox, the virus stays in your body and can cause shingles later in life. Shingles cannot be passed from one person to another, but the virus that causes shingles can spread and cause chickenpox in someone who had never had chickenpox or received chickenpox vaccine. 2. Recombinant shingles vaccine Recombinant shingles vaccine provides strong protection against shingles. By preventing shingles, recombinant shingles vaccine also protects against PHN. Recombinant shingles vaccine is the preferred vaccine for the prevention of shingles. However, a different vaccine, live shingles vaccine, may be used in some circumstances. The  recombinant shingles vaccine is recommended for adults 50 years and older without serious immune problems. It is given as a two-dose series. This vaccine is also recommended for people who have already gotten another type of shingles vaccine, the live shingles vaccine. There is no live virus in this vaccine. Shingles vaccine may be given at the same time as other vaccines. 3. Talk with your health care provider Tell your vaccine provider if the person getting the vaccine:  Has had an allergic reaction after a previous dose of recombinant shingles vaccine, or has any severe, life-threatening allergies.  Is pregnant or breastfeeding.  Is currently experiencing an episode of shingles. In some cases, your health care provider may decide to postpone shingles vaccination to a future visit. People with minor illnesses, such as a cold, may be vaccinated. People who are moderately or severely ill should usually wait until they recover before getting recombinant shingles vaccine. Your health care provider can give you more information. 4. Risks of a vaccine reaction  A sore arm with mild or moderate pain is very common after recombinant shingles vaccine, affecting about 80% of vaccinated people. Redness and swelling can also happen at the site of the injection.  Tiredness, muscle pain, headache, shivering, fever, stomach pain, and nausea happen after vaccination in more than half of people who receive recombinant shingles vaccine. In clinical trials, about 1 out of 6 people who got recombinant zoster vaccine experienced side effects that prevented them from doing regular activities. Symptoms usually went away on their own in 2 to 3 days. You should still get the second dose of recombinant zoster vaccine even if you had one of  these reactions after the first dose. People sometimes faint after medical procedures, including vaccination. Tell your provider if you feel dizzy or have vision changes or ringing  in the ears. As with any medicine, there is a very remote chance of a vaccine causing a severe allergic reaction, other serious injury, or death. 5. What if there is a serious problem? An allergic reaction could occur after the vaccinated person leaves the clinic. If you see signs of a severe allergic reaction (hives, swelling of the face and throat, difficulty breathing, a fast heartbeat, dizziness, or weakness), call 9-1-1 and get the person to the nearest hospital. For other signs that concern you, call your health care provider. Adverse reactions should be reported to the Vaccine Adverse Event Reporting System (VAERS). Your health care provider will usually file this report, or you can do it yourself. Visit the VAERS website at www.vaers.LAgents.no or call 817 167 3643. VAERS is only for reporting reactions, and VAERS staff do not give medical advice. 6. How can I learn more?  Ask your health care provider.  Call your local or state health department.  Contact the Centers for Disease Control and Prevention (CDC): ? Call (260) 162-5287 (1-800-CDC-INFO) or ? Visit CDC's website at PicCapture.uy Vaccine Information Statement Recombinant Zoster Vaccine (05/19/2018) This information is not intended to replace advice given to you by your health care provider. Make sure you discuss any questions you have with your health care provider. Document Released: 09/16/2016 Document Revised: 10/26/2018 Document Reviewed: 02/10/2018 Elsevier Patient Education  2020 Elsevier Inc.   Vitamin D Deficiency Vitamin D deficiency is when your body does not have enough vitamin D. Vitamin D is important to your body for many reasons:  It helps the body absorb two important minerals-calcium and phosphorus.  It plays a role in bone health.  It may help to prevent some diseases, such as diabetes and multiple sclerosis.  It plays a role in muscle function, including heart function. If vitamin D deficiency  is severe, it can cause a condition in which your bones become soft. In adults, this condition is called osteomalacia. In children, this condition is called rickets. What are the causes? This condition may be caused by:  Not eating enough foods that contain vitamin D.  Not getting enough natural sun exposure.  Having certain digestive system diseases that make it difficult for your body to absorb vitamin D. These diseases include Crohn's disease, chronic pancreatitis, and cystic fibrosis.  Having a surgery in which a part of the stomach or a part of the small intestine is removed.  Having chronic kidney disease or liver disease. What increases the risk? You are more likely to develop this condition if you:  Are older.  Do not spend much time outdoors.  Live in a long-term care facility.  Have had broken bones.  Have weak or thin bones (osteoporosis).  Have a disease or condition that changes how the body absorbs vitamin D.  Have dark skin.  Take certain medicines, such as steroid medicines or certain seizure medicines.  Are overweight or obese. What are the signs or symptoms? In mild cases of vitamin D deficiency, there may not be any symptoms. If the condition is severe, symptoms may include:  Bone pain.  Muscle pain.  Falling often.  Broken bones caused by a minor injury. How is this diagnosed? This condition may be diagnosed with blood tests. Imaging tests such as X-rays may also be done to look for changes in the bone. How is  this treated? Treatment for this condition may depend on what caused the condition. Treatment options include:  Taking vitamin D supplements. Your health care provider will suggest what dose is best for you.  Taking a calcium supplement. Your health care provider will suggest what dose is best for you. Follow these instructions at home: Eating and drinking   Eat foods that contain vitamin D. Choices include: ? Fortified dairy products,  cereals, or juices. Fortified means that vitamin D has been added to the food. Check the label on the package to see if the food is fortified. ? Fatty fish, such as salmon or trout. ? Eggs. ? Oysters. ? Mushrooms. The items listed above may not be a complete list of recommended foods and beverages. Contact a dietitian for more information. General instructions  Take medicines and supplements only as told by your health care provider.  Get regular, safe exposure to natural sunlight.  Do not use a tanning bed.  Maintain a healthy weight. Lose weight if needed.  Keep all follow-up visits as told by your health care provider. This is important. How is this prevented? You can get vitamin D by:  Eating foods that naturally contain vitamin D.  Eating or drinking products that have been fortified with vitamin D, such as cereals, juices, and dairy products (including milk).  Taking a vitamin D supplement or a multivitamin supplement that contains vitamin D.  Being in the sun. Your body naturally makes vitamin D when your skin is exposed to sunlight. Your body changes the sunlight into a form of the vitamin that it can use. Contact a health care provider if:  Your symptoms do not go away.  You feel nauseous or you vomit.  You have fewer bowel movements than usual or are constipated. Summary  Vitamin D deficiency is when your body does not have enough vitamin D.  Vitamin D is important to your body for good bone health and muscle function, and it may help prevent some diseases.  Vitamin D deficiency is primarily treated through supplementation. Your health care provider will suggest what dose is best for you.  You can get vitamin D by eating foods that contain vitamin D, by being in the sun, and by taking a vitamin D supplement or a multivitamin supplement that contains vitamin D. This information is not intended to replace advice given to you by your health care provider. Make sure  you discuss any questions you have with your health care provider. Document Released: 09/29/2011 Document Revised: 03/15/2018 Document Reviewed: 03/15/2018 Elsevier Patient Education  2020 Elsevier Inc.   Leukopenia Leukopenia is a condition in which you have a low number of white blood cells. White blood cells help the body to fight infections. The number of white blood cells in the body varies from person to person. There are five types of white blood cells. Two types (lymphocytes and neutrophils) make up most of the white blood cell count. When lymphocytes are low, the condition is called lymphocytopenia. When neutrophils are low, it is called neutropenia. Neutropenia is the most dangerous type of leukopenia because it can lead to dangerous infections. What are the causes? This condition is commonly caused by damage to soft tissue inside of the bones (bone marrow), which is where most white blood cells are made. Bone marrow can get damaged by:  Medicine or X-ray treatments for cancer (chemotherapy or radiation therapy).  Serious infections.  Cancer of the white blood cells (leukemia, lymphoma, or myeloma).  Medicines,  including: ? Certain antibiotics. ? Certain heart medicines. ? Steroids. ? Certain medicines used to treat diseases of the immune system (autoimmune diseases), like rheumatoid arthritis. Leukopenia also happens when white blood cells are destroyed after leaving the bone marrow, which may result from:  Liver disease.  Autoimmune disease.  Vitamin B deficiencies. What are the signs or symptoms? One of the most common signs of leukopenia, especially severe neutropenia, is having a lot of bacterial infections. Different infections have different symptoms. An infection in your lungs may cause coughing. A urinary tract infection may cause frequent urination and a burning sensation. You may also get infections of the blood, skin, rectum, throat, sinuses, or ears. Some people  have no symptoms. If you do have symptoms, they may include:  Fever.  Fatigue.  Swollen glands (lymph nodes).  Painful mouth ulcers.  Gum disease. How is this diagnosed? This condition may be diagnosed based on:  Your medical history.  A physical exam to check for swollen lymph nodes and an enlarged spleen. Your spleen is an organ on the left side of your body that stores white blood cells.  Tests, such as: ? A complete blood count. This blood test counts each type of white cell. ? Bone marrow aspiration. Some bone marrow is removed to be checked under a microscope. ? Lymph node biopsy. Some lymph node tissue is removed to be checked under a microscope. ? Other types of blood tests or imaging tests. How is this treated? Treatment of leukopenia depends on the cause. Some common treatments include:  Antibiotic medicine to treat bacterial infections.  Stopping medicines that may cause leukopenia.  Medicines to stimulate neutrophil production (hematopoietic growth factors), to treat neutropenia. Follow these instructions at home:  Take over-the-counter and prescription medicines only as told by your health care provider. This includes supplements and vitamins.  If you were prescribed an antibiotic medicine, take it as told by your health care provider. Do not stop taking the antibiotic even if you start to feel better.  Preventing infection is important if you have leukopenia. To prevent infection: ? Avoid close contact with sick people. ? Wash your hands frequently with soap and water. If soap and water are not available, use hand sanitizer. ? Do not eat uncooked or undercooked meats. ? Wash fruits and vegetables before eating them. ? Do not eat or drink unpasteurized dairy products. ? Get regular dental care, and maintain good dental hygiene. You should visit the dentist at least once every 6 months.  Keep all follow-up visits as told by your health care provider. This is  important. Contact a health care provider if:  You have chills or a fever.  You have symptoms of an infection. Get help right away if:  You have a fever that lasts for more than 2-3 days.  You have symptoms that last for more than 2-3 days.  You have trouble breathing.  You have chest pain. This information is not intended to replace advice given to you by your health care provider. Make sure you discuss any questions you have with your health care provider. Document Released: 07/12/2013 Document Revised: 06/19/2017 Document Reviewed: 05/27/2016 Elsevier Patient Education  2020 Elsevier Inc.    Bacterial Vaginosis  Bacterial vaginosis is a vaginal infection that occurs when the normal balance of bacteria in the vagina is disrupted. It results from an overgrowth of certain bacteria. This is the most common vaginal infection among women ages 5-44. Because bacterial vaginosis increases your risk for  STIs (sexually transmitted infections), getting treated can help reduce your risk for chlamydia, gonorrhea, herpes, and HIV (human immunodeficiency virus). Treatment is also important for preventing complications in pregnant women, because this condition can cause an early (premature) delivery. What are the causes? This condition is caused by an increase in harmful bacteria that are normally present in small amounts in the vagina. However, the reason that the condition develops is not fully understood. What increases the risk? The following factors may make you more likely to develop this condition:  Having a new sexual partner or multiple sexual partners.  Having unprotected sex.  Douching.  Having an intrauterine device (IUD).  Smoking.  Drug and alcohol abuse.  Taking certain antibiotic medicines.  Being pregnant. You cannot get bacterial vaginosis from toilet seats, bedding, swimming pools, or contact with objects around you. What are the signs or symptoms? Symptoms of  this condition include:  Grey or white vaginal discharge. The discharge can also be watery or foamy.  A fish-like odor with discharge, especially after sexual intercourse or during menstruation.  Itching in and around the vagina.  Burning or pain with urination. Some women with bacterial vaginosis have no signs or symptoms. How is this diagnosed? This condition is diagnosed based on:  Your medical history.  A physical exam of the vagina.  Testing a sample of vaginal fluid under a microscope to look for a large amount of bad bacteria or abnormal cells. Your health care provider may use a cotton swab or a small wooden spatula to collect the sample. How is this treated? This condition is treated with antibiotics. These may be given as a pill, a vaginal cream, or a medicine that is put into the vagina (suppository). If the condition comes back after treatment, a second round of antibiotics may be needed. Follow these instructions at home: Medicines  Take over-the-counter and prescription medicines only as told by your health care provider.  Take or use your antibiotic as told by your health care provider. Do not stop taking or using the antibiotic even if you start to feel better. General instructions  If you have a female sexual partner, tell her that you have a vaginal infection. She should see her health care provider and be treated if she has symptoms. If you have a female sexual partner, he does not need treatment.  During treatment: ? Avoid sexual activity until you finish treatment. ? Do not douche. ? Avoid alcohol as directed by your health care provider. ? Avoid breastfeeding as directed by your health care provider.  Drink enough water and fluids to keep your urine clear or pale yellow.  Keep the area around your vagina and rectum clean. ? Wash the area daily with warm water. ? Wipe yourself from front to back after using the toilet.  Keep all follow-up visits as told by  your health care provider. This is important. How is this prevented?  Do not douche.  Wash the outside of your vagina with warm water only.  Use protection when having sex. This includes latex condoms and dental dams.  Limit how many sexual partners you have. To help prevent bacterial vaginosis, it is best to have sex with just one partner (monogamous).  Make sure you and your sexual partner are tested for STIs.  Wear cotton or cotton-lined underwear.  Avoid wearing tight pants and pantyhose, especially during summer.  Limit the amount of alcohol that you drink.  Do not use any products that contain nicotine  or tobacco, such as cigarettes and e-cigarettes. If you need help quitting, ask your health care provider.  Do not use illegal drugs. Where to find more information  Centers for Disease Control and Prevention: SolutionApps.co.za  American Sexual Health Association (ASHA): www.ashastd.org  [place] Department of Health and Health and safety inspector, Office on Women's Health: ConventionalMedicines.si or http://www.anderson-williamson.info/ Contact a health care provider if:  Your symptoms do not improve, even after treatment.  You have more discharge or pain when urinating.  You have a fever.  You have pain in your abdomen.  You have pain during sex.  You have vaginal bleeding between periods. Summary  Bacterial vaginosis is a vaginal infection that occurs when the normal balance of bacteria in the vagina is disrupted.  Because bacterial vaginosis increases your risk for STIs (sexually transmitted infections), getting treated can help reduce your risk for chlamydia, gonorrhea, herpes, and HIV (human immunodeficiency virus). Treatment is also important for preventing complications in pregnant women, because the condition can cause an early (premature) delivery.  This condition is treated with antibiotic medicines. These may be given as a pill, a vaginal cream, or a  medicine that is put into the vagina (suppository). This information is not intended to replace advice given to you by your health care provider. Make sure you discuss any questions you have with your health care provider. Document Released: 07/07/2005 Document Revised: 06/19/2017 Document Reviewed: 03/22/2016 Elsevier Patient Education  2020 [employer].

## 2019-05-05 ENCOUNTER — Telehealth: Payer: Self-pay | Admitting: Internal Medicine

## 2019-05-05 NOTE — Telephone Encounter (Signed)
I called pt to schedule next appt, I spoke with pt she states she will call back to schedule Return in about 1 year (around 05/03/2020) for f/u 1 year physiacl and fasting labs 1 week before .

## 2019-05-06 ENCOUNTER — Encounter: Payer: Self-pay | Admitting: Internal Medicine

## 2019-05-06 DIAGNOSIS — M479 Spondylosis, unspecified: Secondary | ICD-10-CM | POA: Insufficient documentation

## 2019-05-06 DIAGNOSIS — K449 Diaphragmatic hernia without obstruction or gangrene: Secondary | ICD-10-CM | POA: Insufficient documentation

## 2019-05-06 DIAGNOSIS — N200 Calculus of kidney: Secondary | ICD-10-CM | POA: Insufficient documentation

## 2019-05-06 DIAGNOSIS — K8020 Calculus of gallbladder without cholecystitis without obstruction: Secondary | ICD-10-CM | POA: Insufficient documentation

## 2019-05-12 ENCOUNTER — Encounter: Payer: Self-pay | Admitting: Internal Medicine

## 2019-05-12 DIAGNOSIS — Z1231 Encounter for screening mammogram for malignant neoplasm of breast: Secondary | ICD-10-CM | POA: Diagnosis not present

## 2019-05-12 DIAGNOSIS — H209 Unspecified iridocyclitis: Secondary | ICD-10-CM | POA: Diagnosis not present

## 2019-05-12 DIAGNOSIS — H02402 Unspecified ptosis of left eyelid: Secondary | ICD-10-CM | POA: Diagnosis not present

## 2019-05-12 DIAGNOSIS — H2013 Chronic iridocyclitis, bilateral: Secondary | ICD-10-CM | POA: Diagnosis not present

## 2019-05-12 DIAGNOSIS — H21511 Anterior synechiae (iris), right eye: Secondary | ICD-10-CM | POA: Diagnosis not present

## 2019-05-12 DIAGNOSIS — H35353 Cystoid macular degeneration, bilateral: Secondary | ICD-10-CM | POA: Diagnosis not present

## 2019-05-12 DIAGNOSIS — H21543 Posterior synechiae (iris), bilateral: Secondary | ICD-10-CM | POA: Diagnosis not present

## 2019-05-12 DIAGNOSIS — H4043X1 Glaucoma secondary to eye inflammation, bilateral, mild stage: Secondary | ICD-10-CM | POA: Diagnosis not present

## 2019-05-12 DIAGNOSIS — H35373 Puckering of macula, bilateral: Secondary | ICD-10-CM | POA: Diagnosis not present

## 2019-05-20 ENCOUNTER — Encounter: Payer: Self-pay | Admitting: Oncology

## 2019-05-20 ENCOUNTER — Inpatient Hospital Stay: Payer: Federal, State, Local not specified - PPO | Attending: Oncology | Admitting: Oncology

## 2019-05-20 ENCOUNTER — Inpatient Hospital Stay: Payer: Federal, State, Local not specified - PPO

## 2019-05-20 ENCOUNTER — Other Ambulatory Visit: Payer: Self-pay

## 2019-05-20 VITALS — BP 145/83 | HR 68 | Temp 96.0°F | Resp 18 | Ht 66.0 in | Wt 245.9 lb

## 2019-05-20 DIAGNOSIS — K449 Diaphragmatic hernia without obstruction or gangrene: Secondary | ICD-10-CM | POA: Diagnosis not present

## 2019-05-20 DIAGNOSIS — Z823 Family history of stroke: Secondary | ICD-10-CM | POA: Insufficient documentation

## 2019-05-20 DIAGNOSIS — Z880 Allergy status to penicillin: Secondary | ICD-10-CM | POA: Insufficient documentation

## 2019-05-20 DIAGNOSIS — Z8261 Family history of arthritis: Secondary | ICD-10-CM | POA: Insufficient documentation

## 2019-05-20 DIAGNOSIS — Z7289 Other problems related to lifestyle: Secondary | ICD-10-CM

## 2019-05-20 DIAGNOSIS — Z789 Other specified health status: Secondary | ICD-10-CM

## 2019-05-20 DIAGNOSIS — L309 Dermatitis, unspecified: Secondary | ICD-10-CM | POA: Insufficient documentation

## 2019-05-20 DIAGNOSIS — Z801 Family history of malignant neoplasm of trachea, bronchus and lung: Secondary | ICD-10-CM | POA: Insufficient documentation

## 2019-05-20 DIAGNOSIS — K429 Umbilical hernia without obstruction or gangrene: Secondary | ICD-10-CM | POA: Diagnosis not present

## 2019-05-20 DIAGNOSIS — D509 Iron deficiency anemia, unspecified: Secondary | ICD-10-CM | POA: Diagnosis not present

## 2019-05-20 DIAGNOSIS — Z818 Family history of other mental and behavioral disorders: Secondary | ICD-10-CM | POA: Insufficient documentation

## 2019-05-20 DIAGNOSIS — K8020 Calculus of gallbladder without cholecystitis without obstruction: Secondary | ICD-10-CM | POA: Diagnosis not present

## 2019-05-20 DIAGNOSIS — Z811 Family history of alcohol abuse and dependence: Secondary | ICD-10-CM | POA: Diagnosis not present

## 2019-05-20 DIAGNOSIS — Z79899 Other long term (current) drug therapy: Secondary | ICD-10-CM

## 2019-05-20 DIAGNOSIS — E611 Iron deficiency: Secondary | ICD-10-CM

## 2019-05-20 DIAGNOSIS — I700 Atherosclerosis of aorta: Secondary | ICD-10-CM | POA: Diagnosis not present

## 2019-05-20 DIAGNOSIS — Z836 Family history of other diseases of the respiratory system: Secondary | ICD-10-CM | POA: Diagnosis not present

## 2019-05-20 DIAGNOSIS — D709 Neutropenia, unspecified: Secondary | ICD-10-CM

## 2019-05-20 DIAGNOSIS — Z832 Family history of diseases of the blood and blood-forming organs and certain disorders involving the immune mechanism: Secondary | ICD-10-CM | POA: Diagnosis not present

## 2019-05-20 DIAGNOSIS — Z8249 Family history of ischemic heart disease and other diseases of the circulatory system: Secondary | ICD-10-CM | POA: Diagnosis not present

## 2019-05-20 DIAGNOSIS — Z881 Allergy status to other antibiotic agents status: Secondary | ICD-10-CM

## 2019-05-20 DIAGNOSIS — N200 Calculus of kidney: Secondary | ICD-10-CM

## 2019-05-20 LAB — C-REACTIVE PROTEIN: CRP: 0.9 mg/dL (ref <1.0)

## 2019-05-20 LAB — CBC WITH DIFFERENTIAL/PLATELET
Abs Immature Granulocytes: 0 10*3/uL (ref 0.00–0.07)
Basophils Absolute: 0 10*3/uL (ref 0.0–0.1)
Basophils Relative: 0 %
Eosinophils Absolute: 0.1 10*3/uL (ref 0.0–0.5)
Eosinophils Relative: 3 %
HCT: 37.7 % (ref 36.0–46.0)
Hemoglobin: 12.1 g/dL (ref 12.0–15.0)
Immature Granulocytes: 0 %
Lymphocytes Relative: 51 %
Lymphs Abs: 1.5 10*3/uL (ref 0.7–4.0)
MCH: 26 pg (ref 26.0–34.0)
MCHC: 32.1 g/dL (ref 30.0–36.0)
MCV: 81.1 fL (ref 80.0–100.0)
Monocytes Absolute: 0.3 10*3/uL (ref 0.1–1.0)
Monocytes Relative: 10 %
Neutro Abs: 1.1 10*3/uL — ABNORMAL LOW (ref 1.7–7.7)
Neutrophils Relative %: 36 %
Platelets: 243 10*3/uL (ref 150–400)
RBC: 4.65 MIL/uL (ref 3.87–5.11)
RDW: 14.9 % (ref 11.5–15.5)
WBC: 3 10*3/uL — ABNORMAL LOW (ref 4.0–10.5)
nRBC: 0 % (ref 0.0–0.2)

## 2019-05-20 LAB — TECHNOLOGIST SMEAR REVIEW
Plt Morphology: NORMAL
RBC Morphology: NORMAL

## 2019-05-20 LAB — VITAMIN B12: Vitamin B-12: 253 pg/mL (ref 180–914)

## 2019-05-20 LAB — IRON AND TIBC
Iron: 45 ug/dL (ref 28–170)
Saturation Ratios: 17 % (ref 10.4–31.8)
TIBC: 262 ug/dL (ref 250–450)
UIBC: 217 ug/dL

## 2019-05-20 LAB — FERRITIN: Ferritin: 130 ng/mL (ref 11–307)

## 2019-05-20 LAB — FOLATE: Folate: 10.6 ng/mL (ref >5.9)

## 2019-05-20 NOTE — Progress Notes (Signed)
Patient here to establish care. Pt concerned about low iron and low WBC count.

## 2019-05-22 NOTE — Progress Notes (Signed)
Hematology/Oncology Consult note [facility] Telephone:([phone]) [phone] [phone]   Patient Care Team: McLean-Scocuzza, Pasty Spillersracy N, MD as PCP - General (Internal Medicine)  REFERRING PROVIDER: McLean-Scocuzza, French Anaracy *  CHIEF COMPLAINTS/REASON FOR VISIT:  Evaluation of iron deficiency anemia leukopenia  HISTORY OF PRESENTING ILLNESS:   Hannah Robinson is a  53 y.o.  female with PMH listed below was seen in consultation at the request of  McLean-Scocuzza, French Anaracy *  for evaluation of iron deficiency anemia leukopenia Patient recently had blood work done ordered by primary care provider. 04/26/2019, CBC showed WBC 3.3, hemoglobin 12.2, differential showed ANC 1.1.  Iron panel showed iron 42, TIBC 227, iron saturation 19, ferritin 132. Patient denies any fever, chills, frequent infection. She recently had urinary symptoms and was treated empirically for UTI.  05/04/2019 CT renal stone study Showed nonobstructing 8 x 6 mm calculus lower lobe left kidney.  Tiny calculus upper lobe right kidney.  No hydronephrosis or ureteral calculus on either side.  Mild thickening of urinary bladder wall potentially representing degree of cystitis.  Cholelithiasis.  Small hiatal hernia.   Symptoms.  No fever, chills, constitutional symptoms.  Review of Systems  Constitutional: Negative for appetite change, chills, fatigue and fever.  HENT:   Negative for hearing loss and voice change.   Eyes: Negative for eye problems.  Respiratory: Negative for chest tightness and cough.   Cardiovascular: Negative for chest pain.  Gastrointestinal: Negative for abdominal distention, abdominal pain and blood in stool.  Endocrine: Negative for hot flashes.  Genitourinary: Negative for difficulty urinating and frequency.   Musculoskeletal: Negative for arthralgias.  Skin: Negative for itching and rash.  Neurological: Negative for extremity weakness.  Hematological: Negative for adenopathy.   Psychiatric/Behavioral: Negative for confusion.    MEDICAL HISTORY:  Past Medical History:  Diagnosis Date  . Anemia    iron infusions in past  . Arthritis    right knee (also torn meniscus)  . Childhood asthma   . Eczema   . Iritis    Followed by Dr. Marti Sleighhurman  . Motion sickness    back seat cars  . Prediabetes   . Urinary tract bacterial infections     SURGICAL HISTORY: Past Surgical History:  Procedure Laterality Date  . BREAST BIOPSY  1988  . CATARACT EXTRACTION W/ INTRAOCULAR LENS IMPLANT Left    b/l eye surgery for cataracts b/l right eye blurry after surgery   . COLONOSCOPY WITH PROPOFOL N/A 03/05/2017   Procedure: COLONOSCOPY WITH PROPOFOL;  Surgeon: Midge MiniumWohl, Darren, MD;  [facility];  Service: Endoscopy;  Laterality: N/A;  . DILATION AND CURETTAGE OF UTERUS  1979  . VAGINAL DELIVERY     2    SOCIAL HISTORY: Social History   Socioeconomic History  . Marital status: Single    Spouse name: Not on file  . Number of children: 2  . Years of education: Not on file  . Highest education level: Not on file  Occupational History  . Occupation: Mail carrier  Social Needs  . Financial resource strain: Not on file  . Food insecurity    Worry: Not on file    Inability: Not on file  . Transportation needs    Medical: Not on file    Non-medical: Not on file  Tobacco Use  . Smoking status: Never Smoker  . Smokeless tobacco: Never Used  Substance and Sexual Activity  . Alcohol use: Yes    Alcohol/week: 11.0 standard drinks    Types:  4 Standard drinks or equivalent, 7 Glasses of wine per week    Comment: Wine  . Drug use: No  . Sexual activity: Not on file  Lifestyle  . Physical activity    Days per week: Not on file    Minutes per session: Not on file  . Stress: Not on file  Relationships  . Social Herbalist on phone: Not on file    Gets together: Not on file    Attends religious service: Not on file    Active member of club or  organization: Not on file    Attends meetings of clubs or organizations: Not on file    Relationship status: Not on file  . Intimate partner violence    Fear of current or ex partner: Not on file    Emotionally abused: Not on file    Physically abused: Not on file    Forced sexual activity: Not on file  Other Topics Concern  . Not on file  Social History Narrative   Lives in [place] with sons, from [place].      Work - Facilities manager, Marine scientist   Diet - regular   Exercise - walks with work    FAMILY HISTORY: Family History  Problem Relation Age of Onset  . Alcohol abuse Maternal Aunt   . Clotting disorder Mother   . Arthritis Mother   . Heart disease Mother   . Bipolar disorder Brother   . Stroke Maternal Grandmother   . Cancer Maternal Aunt        lung  . Asthma Son     ALLERGIES:  is allergic to other; penicillins; tree extract; and amoxicillin.  MEDICATIONS:  Current Outpatient Medications  Medication Sig Dispense Refill  . albuterol (PROAIR HFA) 108 (90 Base) MCG/ACT inhaler Inhale 2 puffs into the lungs every 4 (four) hours as needed. 8.5 g 1  . brimonidine (ALPHAGAN) 0.2 % ophthalmic solution Apply to eye.    . cetirizine (ZYRTEC) 10 MG tablet Take 1 tablet (10 mg total) by mouth daily. (Patient taking differently: Take 10 mg by mouth daily as needed. ) 30 tablet 11  . dorzolamide (TRUSOPT) 2 % ophthalmic solution Apply to eye.    . DUREZOL 0.05 % EMUL PLACE 1 DROP b/l bid  6  . fluticasone (FLONASE) 50 MCG/ACT nasal spray Place 2 sprays into both nostrils daily. 16 g 8  . fluticasone furoate-vilanterol (BREO ELLIPTA) 100-25 MCG/INH AEPB TAKE 1 PUFF BY MOUTH EVERY DAY rinse mouth 60 each 12  . hydrocortisone 2.5 % cream Apply topically 2 (two) times daily. Face and neck 60 g 11  . meloxicam (MOBIC) 15 MG tablet meloxicam 15 mg tablet  TAKE 1 TABLET(S)daily prn DO NOT TAKE WITH IBUPROFEN 90 tablet 1  . triamcinolone cream (KENALOG) 0.1 % Apply 1 application topically 2  (two) times daily. As needed 454 g 1  . albuterol (PROAIR HFA) 108 (90 Base) MCG/ACT inhaler Inhale 2 puffs into the lungs every 4 hours as needed (Patient not taking: Reported on 05/20/2019) 8.5 g 11  . ferrous sulfate 325 (65 FE) MG tablet Take 325 mg by mouth daily with breakfast.    . ibuprofen (ADVIL,MOTRIN) 800 MG tablet Take 1 tablet (800 mg total) by mouth 3 (three) times daily. (Patient not taking: Reported on 05/20/2019) 90 tablet 0  . ibuprofen (ADVIL,MOTRIN) 800 MG tablet Take 0.5 tablets (400 mg total) by mouth every 8 (eight) hours as needed. (Patient not taking:  Reported on 05/20/2019) 90 tablet 0  . metroNIDAZOLE (METROGEL) 0.75 % vaginal gel Place 1 Applicatorful vaginally at bedtime. X 7-10 days (Patient not taking: Reported on 05/20/2019) 70 g 1  . phenazopyridine (PYRIDIUM) 200 MG tablet Take 1 tablet (200 mg total) by mouth 3 (three) times daily as needed for pain. (Patient not taking: Reported on 05/20/2019) 6 tablet 0   No current facility-administered medications for this visit.      PHYSICAL EXAMINATION: ECOG PERFORMANCE STATUS: 0 - Asymptomatic Vitals:   05/20/19 1110  BP: (!) 145/83  Pulse: 68  Resp: 18  Temp: (!) 96 F (35.6 C)   Filed Weights   05/20/19 1110  Weight: 245 lb 14.4 oz (111.5 kg)    Physical Exam Constitutional:      General: She is not in acute distress.    Appearance: She is obese.  HENT:     Head: Normocephalic and atraumatic.  Eyes:     General: No scleral icterus.    Pupils: Pupils are equal, round, and reactive to light.  Neck:     Musculoskeletal: Normal range of motion and neck supple.  Cardiovascular:     Rate and Rhythm: Normal rate and regular rhythm.     Heart sounds: Normal heart sounds.  Pulmonary:     Effort: Pulmonary effort is normal. No respiratory distress.     Breath sounds: No wheezing.  Abdominal:     General: Bowel sounds are normal. There is no distension.     Palpations: Abdomen is soft. There is no mass.      Tenderness: There is no abdominal tenderness.  Musculoskeletal: Normal range of motion.        General: No deformity.  Skin:    General: Skin is warm and dry.     Findings: No erythema or rash.  Neurological:     Mental Status: She is alert and oriented to person, place, and time.     Cranial Nerves: No cranial nerve deficit.     Coordination: Coordination normal.  Psychiatric:        Behavior: Behavior normal.        Thought Content: Thought content normal.     LABORATORY DATA:  I have reviewed the data as listed Lab Results  Component Value Date   WBC 3.0 (L) 05/20/2019   HGB 12.1 05/20/2019   HCT 37.7 05/20/2019   MCV 81.1 05/20/2019   PLT 243 05/20/2019   Recent Labs    04/26/19 0928  NA 140  K 4.0  CL 105  CO2 28  GLUCOSE 98  BUN 15  CREATININE 0.61  CALCIUM 9.0  PROT 7.0  ALBUMIN 3.9  AST 18  ALT 13  ALKPHOS 74  BILITOT 0.4   Iron/TIBC/Ferritin/ %Sat    Component Value Date/Time   IRON 45 05/20/2019 1149   IRON 50 08/22/2011 1329   TIBC 262 05/20/2019 1149   TIBC 311 08/22/2011 1329   FERRITIN 130 05/20/2019 1149   FERRITIN 19 08/22/2011 1329   IRONPCTSAT 17 05/20/2019 1149   IRONPCTSAT 19 04/26/2019 0928      RADIOGRAPHIC STUDIES: I have personally reviewed the radiological images as listed and agreed with the findings in the report.  Ct Renal Stone Study  Result Date: 05/05/2019 CLINICAL DATA:  Dysuria and hematuria EXAM: CT ABDOMEN AND PELVIS WITHOUT CONTRAST TECHNIQUE: Multidetector CT imaging of the abdomen and pelvis was performed following the standard protocol without oral or IV contrast. COMPARISON:  November 11, 2013 FINDINGS:  Lower chest: There is scarring and atelectatic change in the medial right base, similar to prior study. Lung bases otherwise are clear. There is a small hiatal hernia. Hepatobiliary: Liver measures 19.1 cm in length. No focal liver lesions are evident on this noncontrast enhanced study. There are gallstones  within the gallbladder. The gallbladder wall does not appear appreciably thickened. There is no evident biliary duct dilatation. Pancreas: There is no pancreatic mass or inflammatory focus. Spleen: No splenic lesions are evident. Adrenals/Urinary Tract: Adrenals bilaterally appear unremarkable. There is no evident renal mass or hydronephrosis on either side. There is a calculus in the lower pole of the left kidney measuring 8 x 5 mm. There is a tiny calculus in the upper pole of the right kidney. There is no evident ureteral calculus on either side. Urinary bladder is midline. Urinary bladder wall appears mildly thickened. Stomach/Bowel: There is no appreciable bowel wall or mesenteric thickening. There is moderate stool in the colon. There is no evident bowel obstruction. The terminal ileum appears unremarkable. There is no appreciable free air or portal venous air. Vascular/Lymphatic: There is no abdominal aortic aneurysm. There are foci of atherosclerotic calcification in the aorta and common iliac arteries. No adenopathy is evident in the abdomen or pelvis. Reproductive: Uterus is anteverted.  No pelvic mass evident. Other: Appendix appears unremarkable. There is no abscess or ascites in the abdomen or pelvis. A small umbilical hernia is present containing only fat. Musculoskeletal: There are foci of degenerative change in the lower thoracic and lumbar regions. There are no blastic or lytic bone lesions. There is no intramuscular lesion evident. IMPRESSION: 1. Nonobstructing 8 x 6 mm calculus lower pole left kidney. Tiny calculus upper pole right kidney. No hydronephrosis or ureteral calculus on either side. 2. Mild thickening of the urinary bladder wall, potentially representing a degree of cystitis. 3. Cholelithiasis. Gallbladder wall does not appear appreciably thickened. 4. Small hiatal hernia. Small umbilical hernia containing only fat. 5. No bowel obstruction. No abscess in the abdomen or pelvis. Appendix  region appears normal. 6.  Aortic Atherosclerosis (ICD10-I70.0). Electronically Signed   By: Bretta Bang III M.D.   On: 05/05/2019 08:03      ASSESSMENT & PLAN:  1. Neutropenia, unspecified type (HCC)   2. Low serum iron   3. Alcohol use    Leukopenia, predominantly neutropenia, ANC 1.1. Reviewed previous lab records.  Patient has chronic leukopenia, thanks 2016.  Baseline total white count ranges from 3.4-3.8.  ANC fluctuates between 1.3-1.5.  Recent blood work shows a slightly lower ANC level of 1.1.  This can be reactive due to acute inflammation/infection, vitamin deficiencies, alcohol or underlying bone marrow process. that I recommend check CBC, smear, ANA, CRP, folate,  B12. If patient has persistent neutropenia, I will proceed with flow cytometry  Low serum level.  Iron panel was reviewed.  Low iron level however normal iron saturation and ferritin level.  Not consistent with iron deficiency.  Will repeat  Alcohol cessation discussed with patient.   Labs reviewed.  ANC is persistently low.  Vitamin B12 low normal.  We will go ahead and proceed with peripheral blood flow cytometry. Orders Placed This Encounter  Procedures  . CBC with Differential/Platelet    Standing Status:   Future    Number of Occurrences:   1    Standing Expiration Date:   05/19/2020  . Technologist smear review    Standing Status:   Future    Number of Occurrences:   1  Standing Expiration Date:   05/19/2020  . Vitamin B12    Standing Status:   Future    Number of Occurrences:   1    Standing Expiration Date:   05/19/2020  . Folate    Standing Status:   Future    Number of Occurrences:   1    Standing Expiration Date:   05/19/2020  . Ferritin    Standing Status:   Future    Number of Occurrences:   1    Standing Expiration Date:   05/19/2020  . Iron and TIBC    Standing Status:   Future    Number of Occurrences:   1    Standing Expiration Date:   05/19/2020  . ANA w/Reflex     Standing Status:   Future    Number of Occurrences:   1    Standing Expiration Date:   05/19/2020  . C-reactive protein    Standing Status:   Future    Number of Occurrences:   1    Standing Expiration Date:   05/19/2020    All questions were answered. The patient knows to call the clinic with any problems questions or concerns.   McLean-Scocuzza, French Ana *    Return of visit: 2 weeks Thank you for this kind referral and the opportunity to participate in the care of this patient. A copy of today's note is routed to referring provider  Total face to face encounter time for this patient visit was 45 min. >50% of the time was  spent in counseling and coordination of care.    Rickard Patience, MD, PhD Hematology Oncology [facility] Pager- [phone] 05/22/2019

## 2019-05-23 ENCOUNTER — Telehealth: Payer: Self-pay

## 2019-05-23 LAB — ENA+DNA/DS+SJORGEN'S
ENA SM Ab Ser-aCnc: <0.2 AI (ref 0.0–0.9)
Ribonucleic Protein: <0.2 AI (ref 0.0–0.9)
SSA (Ro) (ENA) Antibody, IgG: <0.2 AI (ref 0.0–0.9)
SSB (La) (ENA) Antibody, IgG: <0.2 AI (ref 0.0–0.9)
ds DNA Ab: <1 IU/mL (ref 0–9)

## 2019-05-23 LAB — ANA W/REFLEX: Anti Nuclear Antibody (ANA): POSITIVE — AB

## 2019-05-23 NOTE — Telephone Encounter (Signed)
Patient informed more labs needed.  She was asking for Monday morning labs but her next appt with Dr. Tasia Catchings is next Friday and this may not give enough time for results.  She really needs to come this week.  Please inform patient of lab appt.

## 2019-05-23 NOTE — Telephone Encounter (Signed)
-----   Message from Earlie Server, MD sent at 05/22/2019  9:04 PM EST ----- Please arrange patient to have additional blood work done. -Ordered.  Thanks.

## 2019-05-23 NOTE — Telephone Encounter (Signed)
Done.Hannah Robinson... Pt is aware of her sched lab appt on 11/5 @ 345

## 2019-05-26 ENCOUNTER — Inpatient Hospital Stay: Payer: Federal, State, Local not specified - PPO | Attending: Oncology

## 2019-05-26 ENCOUNTER — Other Ambulatory Visit: Payer: Self-pay

## 2019-05-26 DIAGNOSIS — K8020 Calculus of gallbladder without cholecystitis without obstruction: Secondary | ICD-10-CM | POA: Diagnosis not present

## 2019-05-26 DIAGNOSIS — Z823 Family history of stroke: Secondary | ICD-10-CM | POA: Insufficient documentation

## 2019-05-26 DIAGNOSIS — Z811 Family history of alcohol abuse and dependence: Secondary | ICD-10-CM | POA: Insufficient documentation

## 2019-05-26 DIAGNOSIS — Z880 Allergy status to penicillin: Secondary | ICD-10-CM | POA: Insufficient documentation

## 2019-05-26 DIAGNOSIS — Z801 Family history of malignant neoplasm of trachea, bronchus and lung: Secondary | ICD-10-CM | POA: Diagnosis not present

## 2019-05-26 DIAGNOSIS — E611 Iron deficiency: Secondary | ICD-10-CM

## 2019-05-26 DIAGNOSIS — Z789 Other specified health status: Secondary | ICD-10-CM

## 2019-05-26 DIAGNOSIS — Z836 Family history of other diseases of the respiratory system: Secondary | ICD-10-CM | POA: Insufficient documentation

## 2019-05-26 DIAGNOSIS — Z7289 Other problems related to lifestyle: Secondary | ICD-10-CM

## 2019-05-26 DIAGNOSIS — D709 Neutropenia, unspecified: Secondary | ICD-10-CM | POA: Diagnosis not present

## 2019-05-26 DIAGNOSIS — Z8249 Family history of ischemic heart disease and other diseases of the circulatory system: Secondary | ICD-10-CM | POA: Insufficient documentation

## 2019-05-26 DIAGNOSIS — Z832 Family history of diseases of the blood and blood-forming organs and certain disorders involving the immune mechanism: Secondary | ICD-10-CM | POA: Insufficient documentation

## 2019-05-26 DIAGNOSIS — Z79899 Other long term (current) drug therapy: Secondary | ICD-10-CM | POA: Diagnosis not present

## 2019-05-26 DIAGNOSIS — Z8261 Family history of arthritis: Secondary | ICD-10-CM | POA: Diagnosis not present

## 2019-05-26 DIAGNOSIS — K449 Diaphragmatic hernia without obstruction or gangrene: Secondary | ICD-10-CM | POA: Insufficient documentation

## 2019-05-30 LAB — COMP PANEL: LEUKEMIA/LYMPHOMA

## 2019-06-03 ENCOUNTER — Ambulatory Visit: Payer: Federal, State, Local not specified - PPO | Admitting: Oncology

## 2019-06-07 ENCOUNTER — Other Ambulatory Visit: Payer: Self-pay

## 2019-06-07 ENCOUNTER — Encounter: Payer: Self-pay | Admitting: Oncology

## 2019-06-07 ENCOUNTER — Inpatient Hospital Stay (HOSPITAL_BASED_OUTPATIENT_CLINIC_OR_DEPARTMENT_OTHER): Payer: Federal, State, Local not specified - PPO | Admitting: Oncology

## 2019-06-07 VITALS — BP 132/85 | HR 76 | Temp 98.1°F | Resp 20 | Wt 243.7 lb

## 2019-06-07 DIAGNOSIS — Z832 Family history of diseases of the blood and blood-forming organs and certain disorders involving the immune mechanism: Secondary | ICD-10-CM | POA: Diagnosis not present

## 2019-06-07 DIAGNOSIS — K8020 Calculus of gallbladder without cholecystitis without obstruction: Secondary | ICD-10-CM | POA: Diagnosis not present

## 2019-06-07 DIAGNOSIS — Z789 Other specified health status: Secondary | ICD-10-CM

## 2019-06-07 DIAGNOSIS — Z7289 Other problems related to lifestyle: Secondary | ICD-10-CM

## 2019-06-07 DIAGNOSIS — E611 Iron deficiency: Secondary | ICD-10-CM | POA: Diagnosis not present

## 2019-06-07 DIAGNOSIS — Z823 Family history of stroke: Secondary | ICD-10-CM | POA: Diagnosis not present

## 2019-06-07 DIAGNOSIS — Z8261 Family history of arthritis: Secondary | ICD-10-CM | POA: Diagnosis not present

## 2019-06-07 DIAGNOSIS — D709 Neutropenia, unspecified: Secondary | ICD-10-CM

## 2019-06-07 DIAGNOSIS — Z880 Allergy status to penicillin: Secondary | ICD-10-CM | POA: Diagnosis not present

## 2019-06-07 DIAGNOSIS — Z811 Family history of alcohol abuse and dependence: Secondary | ICD-10-CM | POA: Diagnosis not present

## 2019-06-07 DIAGNOSIS — Z8249 Family history of ischemic heart disease and other diseases of the circulatory system: Secondary | ICD-10-CM | POA: Diagnosis not present

## 2019-06-07 DIAGNOSIS — Z79899 Other long term (current) drug therapy: Secondary | ICD-10-CM | POA: Diagnosis not present

## 2019-06-07 DIAGNOSIS — K449 Diaphragmatic hernia without obstruction or gangrene: Secondary | ICD-10-CM | POA: Diagnosis not present

## 2019-06-07 DIAGNOSIS — Z801 Family history of malignant neoplasm of trachea, bronchus and lung: Secondary | ICD-10-CM | POA: Diagnosis not present

## 2019-06-07 DIAGNOSIS — Z836 Family history of other diseases of the respiratory system: Secondary | ICD-10-CM | POA: Diagnosis not present

## 2019-06-07 MED ORDER — VITAMIN B-12 1000 MCG PO TABS: 1000.0000 ug | ORAL_TABLET | Freq: Every day | ORAL | 3 refills | Status: DC

## 2019-06-07 NOTE — Progress Notes (Signed)
Hematology/Oncology follow up  note [facility] [phone] [phone]   Patient Care Team: McLean-Scocuzza, Pasty Spillers, MD as PCP - General (Internal Medicine)  REFERRING PROVIDER: McLean-Scocuzza, French Ana *  CHIEF COMPLAINTS/REASON FOR VISIT:  Follow-up for leukopenia  HISTORY OF PRESENTING ILLNESS:   Hannah Robinson is a  53 y.o.  female with PMH listed below was seen in consultation at the request of  McLean-Scocuzza, French Ana *  for evaluation of iron deficiency anemia leukopenia Patient recently had blood work done ordered by primary care provider. 04/26/2019, CBC showed WBC 3.3, hemoglobin 12.2, differential showed ANC 1.1.  Iron panel showed iron 42, TIBC 227, iron saturation 19, ferritin 132. Patient denies any fever, chills, frequent infection. She recently had urinary symptoms and was treated empirically for UTI.  05/04/2019 CT renal stone study Showed nonobstructing 8 x 6 mm calculus lower lobe left kidney.  Tiny calculus upper lobe right kidney.  No hydronephrosis or ureteral calculus on either side.  Mild thickening of urinary bladder wall potentially representing degree of cystitis.  Cholelithiasis.  Small hiatal hernia.   Symptoms.  No fever, chills, constitutional symptoms.  INTERVAL HISTORY Hannah Robinson is a 53 y.o. female who has above history reviewed by me today presents for follow up visit for management of leukopenia, low iron saturation level. Problems and complaints are listed below: During the interval patient had underwent lab work-up and presented discuss results. She continues to have some hand joint pain.  Otherwise no new complaints. Denies weight loss, fever, chills, fatigue, night sweats.  She has cut down alcohol use recently.  Review of Systems  Constitutional: Negative for appetite change, chills, fatigue and fever.  HENT:   Negative for hearing loss and voice change.   Eyes: Negative for eye problems.   Respiratory: Negative for chest tightness and cough.   Cardiovascular: Negative for chest pain.  Gastrointestinal: Negative for abdominal distention, abdominal pain and blood in stool.  Endocrine: Negative for hot flashes.  Genitourinary: Negative for difficulty urinating and frequency.   Musculoskeletal: Negative for arthralgias.  Skin: Negative for itching and rash.  Neurological: Negative for extremity weakness.  Hematological: Negative for adenopathy.  Psychiatric/Behavioral: Negative for confusion.    MEDICAL HISTORY:  Past Medical History:  Diagnosis Date  . Anemia    iron infusions in past  . Arthritis    right knee (also torn meniscus)  . Childhood asthma   . Eczema   . Iritis    Followed by Dr. Marti Sleigh  . Motion sickness    back seat cars  . Prediabetes   . Urinary tract bacterial infections     SURGICAL HISTORY: Past Surgical History:  Procedure Laterality Date  . BREAST BIOPSY  1988  . CATARACT EXTRACTION W/ INTRAOCULAR LENS IMPLANT Left    b/l eye surgery for cataracts b/l right eye blurry after surgery   . COLONOSCOPY WITH PROPOFOL N/A 03/05/2017   Procedure: COLONOSCOPY WITH PROPOFOL;  Surgeon: Midge Minium, MD;  Location: [facility];  Service: Endoscopy;  Laterality: N/A;  . DILATION AND CURETTAGE OF UTERUS  1979  . VAGINAL DELIVERY     2    SOCIAL HISTORY: Social History   Socioeconomic History  . Marital status: Single    Spouse name: Not on file  . Number of children: 2  . Years of education: Not on file  . Highest education level: Not on file  Occupational History  . Occupation: Mail carrier  Social Needs  .  Financial resource strain: Not on file  . Food insecurity    Worry: Not on file    Inability: Not on file  . Transportation needs    Medical: Not on file    Non-medical: Not on file  Tobacco Use  . Smoking status: Never Smoker  . Smokeless tobacco: Never Used  Substance and Sexual Activity  . Alcohol use: Yes     Alcohol/week: 11.0 standard drinks    Types: 4 Standard drinks or equivalent, 7 Glasses of wine per week    Comment: Wine  . Drug use: No  . Sexual activity: Not on file  Lifestyle  . Physical activity    Days per week: Not on file    Minutes per session: Not on file  . Stress: Not on file  Relationships  . Social Musicianconnections    Talks on phone: Not on file    Gets together: Not on file    Attends religious service: Not on file    Active member of club or organization: Not on file    Attends meetings of clubs or organizations: Not on file    Relationship status: Not on file  . Intimate partner violence    Fear of current or ex partner: Not on file    Emotionally abused: Not on file    Physically abused: Not on file    Forced sexual activity: Not on file  Other Topics Concern  . Not on file  Social History Narrative   [place] in [place] sons, from [place].      Work - Runner, broadcasting/film/videoDelivers mail, Forensic scientistost Office   Diet - regular   Exercise - walks with work    FAMILY HISTORY: Family History  Problem Relation Age of Onset  . Alcohol abuse Maternal Aunt   . Clotting disorder Mother   . Arthritis Mother   . Heart disease Mother   . Bipolar disorder Brother   . Stroke Maternal Grandmother   . Cancer Maternal Aunt        lung  . Asthma Son     ALLERGIES:  is allergic to other; penicillins; tree extract; and amoxicillin.  MEDICATIONS:  Current Outpatient Medications  Medication Sig Dispense Refill  . albuterol (PROAIR HFA) 108 (90 Base) MCG/ACT inhaler Inhale 2 puffs into the lungs every 4 hours as needed 8.5 g 11  . brimonidine (ALPHAGAN) 0.2 % ophthalmic solution Apply to eye.    . cetirizine (ZYRTEC) 10 MG tablet Take 1 tablet (10 mg total) by mouth daily. (Patient taking differently: Take 10 mg by mouth daily as needed. ) 30 tablet 11  . dorzolamide (TRUSOPT) 2 % ophthalmic solution Apply to eye.    . DUREZOL 0.05 % EMUL PLACE 1 DROP b/l bid  6  . fluticasone (FLONASE) 50 MCG/ACT  nasal spray Place 2 sprays into both nostrils daily. 16 g 8  . fluticasone furoate-vilanterol (BREO ELLIPTA) 100-25 MCG/INH AEPB TAKE 1 PUFF BY MOUTH EVERY DAY rinse mouth 60 each 12  . hydrocortisone 2.5 % cream Apply topically 2 (two) times daily. Face and neck 60 g 11  . meloxicam (MOBIC) 15 MG tablet meloxicam 15 mg tablet  TAKE 1 TABLET(S)daily prn DO NOT TAKE WITH IBUPROFEN 90 tablet 1  . triamcinolone cream (KENALOG) 0.1 % Apply 1 application topically 2 (two) times daily. As needed 454 g 1  . ferrous sulfate 325 (65 FE) MG tablet Take 325 mg by mouth daily with breakfast.    . vitamin B-12 (CYANOCOBALAMIN)  1000 MCG tablet Take 1 tablet (1,000 mcg total) by mouth daily. 90 tablet 3   No current facility-administered medications for this visit.      PHYSICAL EXAMINATION: ECOG PERFORMANCE STATUS: 0 - Asymptomatic Vitals:   06/07/19 1059  BP: 132/85  Pulse: 76  Resp: 20  Temp: 98.1 F (36.7 C)   Filed Weights   06/07/19 1059  Weight: 243 lb 11.2 oz (110.5 kg)    Physical Exam Constitutional:      General: She is not in acute distress.    Appearance: She is obese.  HENT:     Head: Normocephalic and atraumatic.  Eyes:     General: No scleral icterus.    Pupils: Pupils are equal, round, and reactive to light.  Neck:     Musculoskeletal: Normal range of motion and neck supple.  Cardiovascular:     Rate and Rhythm: Normal rate and regular rhythm.     Heart sounds: Normal heart sounds.  Pulmonary:     Effort: Pulmonary effort is normal. No respiratory distress.     Breath sounds: No wheezing.  Abdominal:     General: Bowel sounds are normal. There is no distension.     Palpations: Abdomen is soft. There is no mass.     Tenderness: There is no abdominal tenderness.  Musculoskeletal: Normal range of motion.        General: No deformity.  Skin:    General: Skin is warm and dry.     Findings: No erythema or rash.  Neurological:     Mental Status: She is alert and  oriented to person, place, and time.     Cranial Nerves: No cranial nerve deficit.     Coordination: Coordination normal.  Psychiatric:        Behavior: Behavior normal.        Thought Content: Thought content normal.     LABORATORY DATA:  I have reviewed the data as listed Lab Results  Component Value Date   WBC 3.0 (L) 05/20/2019   HGB 12.1 05/20/2019   HCT 37.7 05/20/2019   MCV 81.1 05/20/2019   PLT 243 05/20/2019   Recent Labs    04/26/19 0928  NA 140  K 4.0  CL 105  CO2 28  GLUCOSE 98  BUN 15  CREATININE 0.61  CALCIUM 9.0  PROT 7.0  ALBUMIN 3.9  AST 18  ALT 13  ALKPHOS 74  BILITOT 0.4   Iron/TIBC/Ferritin/ %Sat    Component Value Date/Time   IRON 45 05/20/2019 1149   IRON 50 08/22/2011 1329   TIBC 262 05/20/2019 1149   TIBC 311 08/22/2011 1329   FERRITIN 130 05/20/2019 1149   FERRITIN 19 08/22/2011 1329   IRONPCTSAT 17 05/20/2019 1149   IRONPCTSAT 19 04/26/2019 0928      RADIOGRAPHIC STUDIES: I have personally reviewed the radiological images as listed and agreed with the findings in the report.  No results found.    ASSESSMENT & PLAN:  1. Neutropenia, unspecified type ([place])   2. Alcohol use   3. Low serum iron    Leukopenia, predominantly neutropenia, ANC 1.1.  Chronic. Labs are reviewed and discussed with patient. Negative flow cytometry, normal folate level.  Negative CRP. She has borderline low vitamin B12 level, likely secondary to chronic alcohol use. Discussed with patient that low vitamin B12 can potentially contribute to low white blood cell counts. Recommend patient to start taking oral vitamin B12 supplementation 1000 MCG daily. Other work-up includes positive ANA,  reflex to negative dsDNA, ribonucleic protein negative, negative SSA, negative SSB.  We discussed about referring her to rheumatology. She recalls that used to see a rheumatologist once in the past when she had Iritis.  At that time she was recommended to start  methotrexate.  She did not want to start and lost follow-up.  We discussed about her establish care with rheumatology at [facility] for second opinion.  Patient feels that she is asymptomatic right now, I symptoms has been well controlled.  She wants to defer rheumatology at this point.  Since her reflux tests are all negative.  I think is reasonable.  Continue monitor.  Alcohol use, we had another discussion about cutting down alcohol consumption.  She is very motivated. Low serum iron, she has normal ferritin and normal iron saturation.  No iron deficiency.    Orders Placed This Encounter  Procedures  . CBC with Differential    Standing Status:   Future    Standing Expiration Date:   06/06/2020  . Comprehensive metabolic panel    Standing Status:   Future    Standing Expiration Date:   06/06/2020  . Vitamin B12    Standing Status:   Future    Standing Expiration Date:   06/06/2020    All questions were answered. The patient knows to call the clinic with any problems questions or concerns.  cc McLean-Scocuzza, French Ana *    Return of visit: 6 months.    Rickard Patience, MD, PhD Hematology Oncology [facility] at [facility] Pager- [phone] 06/07/2019

## 2019-08-07 ENCOUNTER — Other Ambulatory Visit: Payer: Self-pay | Admitting: Internal Medicine

## 2019-08-07 DIAGNOSIS — L309 Dermatitis, unspecified: Secondary | ICD-10-CM

## 2019-08-07 MED ORDER — TRIAMCINOLONE ACETONIDE 0.1 % EX CREA: 1.0000 "application " | TOPICAL_CREAM | Freq: Two times a day (BID) | CUTANEOUS | 1 refills | Status: DC

## 2019-09-01 DIAGNOSIS — H2013 Chronic iridocyclitis, bilateral: Secondary | ICD-10-CM | POA: Diagnosis not present

## 2019-09-01 DIAGNOSIS — H35373 Puckering of macula, bilateral: Secondary | ICD-10-CM | POA: Diagnosis not present

## 2019-09-01 DIAGNOSIS — H21543 Posterior synechiae (iris), bilateral: Secondary | ICD-10-CM | POA: Diagnosis not present

## 2019-09-01 DIAGNOSIS — H35353 Cystoid macular degeneration, bilateral: Secondary | ICD-10-CM | POA: Diagnosis not present

## 2019-09-10 ENCOUNTER — Other Ambulatory Visit: Payer: Self-pay

## 2019-09-10 ENCOUNTER — Ambulatory Visit: Payer: Worker's Compensation

## 2019-09-10 ENCOUNTER — Ambulatory Visit
Admission: EM | Admit: 2019-09-10 | Discharge: 2019-09-10 | Disposition: A | Discharge status: Home / Self Care | Payer: Worker's Compensation | Attending: Family Medicine | Admitting: Family Medicine

## 2019-09-10 ENCOUNTER — Encounter: Payer: Self-pay | Admitting: Emergency Medicine

## 2019-09-10 DIAGNOSIS — W19XXXA Unspecified fall, initial encounter: Secondary | ICD-10-CM | POA: Diagnosis not present

## 2019-09-10 DIAGNOSIS — S60211A Contusion of right wrist, initial encounter: Secondary | ICD-10-CM

## 2019-09-10 DIAGNOSIS — S63501A Unspecified sprain of right wrist, initial encounter: Secondary | ICD-10-CM | POA: Diagnosis not present

## 2019-09-10 MED ORDER — ACETAMINOPHEN 500 MG PO TABS: 1000.0000 mg | ORAL_TABLET | Freq: Four times a day (QID) | ORAL | 0 refills | Status: DC | PRN

## 2019-09-10 NOTE — Discharge Instructions (Addendum)
Please continue with meloxicam daily.  You may take Tylenol for additional pain relief.  Apply ice to the area 20 minutes every hour.  Wear thumb spica splint at all times except for showering.  Please follow-up with orthopedics in 5 to 7 days for repeat evaluation and x-ray.  Your x-rays today were negative but due to tenderness along the scaphoid, swelling along the scaphoid there is concern for possible scaphoid fracture.

## 2019-09-10 NOTE — ED Triage Notes (Signed)
Pt c/o right wrist pain. She fell while at work. She is not injured anywhere else.

## 2019-09-10 NOTE — ED Provider Notes (Signed)
[facility]    CSN: [id number] Arrival date & time: 09/10/19  1405      History   Chief Complaint Chief Complaint  Patient presents with  . Fall    W/C  . Wrist Pain    right    HPI Hannah Robinson is a 54 y.o. female presents to the urgent care facility for evaluation of right wrist pain.  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  She describes an injury in which her palms against her hip and she fell against the backside of her hand.  She is not sure her hand was outstretched or not.  She complains of pain to the dorsal aspect of the hand and wrist along the scaphoid region with some soft tissue swelling.  Pain is moderate.  She denies any other pain or injury to her body.  No head injury or LOC.  No increasing neck or back pain.  She takes meloxicam daily.  No numbness or tingling.  She is right-hand dominant.  HPI  Past Medical History:  Diagnosis Date  . Anemia    iron infusions in past  . Arthritis    right knee (also torn meniscus)  . Childhood asthma   . Eczema   . Iritis    Followed by Dr. Marti Sleigh  . Motion sickness    back seat cars  . Prediabetes   . Urinary tract bacterial infections     Patient Active Problem List   Diagnosis Date Noted  . Kidney stone 05/06/2019  . Hiatal hernia 05/06/2019  . Arthritis of back 05/06/2019  . Gallstones 05/06/2019  . Iron deficiency 05/04/2019  . Bacterial vaginitis 05/04/2019  . Annual physical exam 02/05/2018  . Intertrigo 02/05/2018  . Obesity, Class III, BMI 40-49.9 (morbid obesity) (HCC) 02/05/2018  . Leukopenia 12/27/2017  . Special screening for malignant neoplasms, colon   . Acute bronchitis 06/18/2016  . Skin lesion 12/07/2015  . Right knee pain 10/12/2015  . BMI 30.0-30.9,adult 12/04/2014  . Candidal skin infection 12/04/2014  . Routine general medical examination at a health care facility 09/02/2013  .  Dyshidrotic eczema 09/02/2013  . Eczema 07/26/2013  . Dysuria 08/09/2012  . Vitamin D deficiency 08/09/2012  . Iron deficiency anemia 03/01/2012  . Screening for skin cancer 03/01/2012  . Iritis 03/01/2012  . Asthma 08/25/2011  . Rhinitis, allergic 08/25/2011  . Reflux 08/25/2011    Past Surgical History:  Procedure Laterality Date  . BREAST BIOPSY  1988  . CATARACT EXTRACTION W/ INTRAOCULAR LENS IMPLANT Left    b/l eye surgery for cataracts b/l right eye blurry after surgery   . COLONOSCOPY WITH PROPOFOL N/A 03/05/2017   Procedure: COLONOSCOPY WITH PROPOFOL;  Surgeon: Midge Minium, MD;  Location: [facility];  Service: Endoscopy;  Laterality: N/A;  . DILATION AND CURETTAGE OF UTERUS  1979  . VAGINAL DELIVERY     2    OB History   No obstetric history on file.      Home Medications    Prior to Admission medications   Medication Sig Start Date End Date Taking? Authorizing Provider  albuterol (PROAIR HFA) 108 (90 Base) MCG/ACT inhaler Inhale 2 puffs into the lungs every 4 hours as needed 03/16/19  Yes McLean-Scocuzza, Pasty Spillers, MD  brimonidine (ALPHAGAN) 0.2 % ophthalmic solution Apply to eye. 05/12/19 05/11/20 Yes [provider]  dorzolamide (TRUSOPT) 2 % ophthalmic  solution Apply to eye. 05/12/19 05/11/20 Yes [provider]  DUREZOL 0.05 % EMUL PLACE 1 DROP b/l bid 10/31/16  Yes [provider]  fluticasone (FLONASE) 50 MCG/ACT nasal spray Place 2 sprays into both nostrils daily. 06/18/16  Yes Arnett, Lyn Records, FNP  fluticasone furoate-vilanterol (BREO ELLIPTA) 100-25 MCG/INH AEPB TAKE 1 PUFF BY MOUTH EVERY DAY rinse mouth 01/11/19  Yes McLean-Scocuzza, Pasty Spillers, MD  vitamin B-12 (CYANOCOBALAMIN) 1000 MCG tablet Take 1 tablet (1,000 mcg total) by mouth daily. 06/07/19  Yes Rickard Patience, MD  acetaminophen (TYLENOL) 500 MG tablet Take 2 tablets (1,000 mg total) by mouth every 6 (six) hours as needed. 09/10/19   Evon Slack, PA-C  cetirizine  (ZYRTEC) 10 MG tablet Take 1 tablet (10 mg total) by mouth daily. Patient taking differently: Take 10 mg by mouth daily as needed.  06/18/16   Allegra Grana, FNP  ferrous sulfate 325 (65 FE) MG tablet Take 325 mg by mouth daily with breakfast.    [provider]  hydrocortisone 2.5 % cream Apply topically 2 (two) times daily. Face and neck 04/07/19   McLean-Scocuzza, Pasty Spillers, MD  meloxicam (MOBIC) 15 MG tablet meloxicam 15 mg tablet  TAKE 1 TABLET(S)daily prn DO NOT TAKE WITH IBUPROFEN 04/07/19   McLean-Scocuzza, Pasty Spillers, MD  triamcinolone cream (KENALOG) 0.1 % Apply 1 application topically 2 (two) times daily. As needed 08/07/19   McLean-Scocuzza, Pasty Spillers, MD  norethindrone (MICRONOR,CAMILA,ERRIN) 0.35 MG tablet Take 1 tablet by mouth daily.  10/08/11  [provider]    Family History Family History  Problem Relation Age of Onset  . Alcohol abuse Maternal Aunt   . Clotting disorder Mother   . Arthritis Mother   . Heart disease Mother   . Bipolar disorder Brother   . Stroke Maternal Grandmother   . Cancer Maternal Aunt        lung  . Asthma Son     Social History Social History   Tobacco Use  . Smoking status: Never Smoker  . Smokeless tobacco: Never Used  Substance Use Topics  . Alcohol use: Yes    Alcohol/week: 11.0 standard drinks    Types: 4 Standard drinks or equivalent, 7 Glasses of wine per week    Comment: Wine  . Drug use: No     Allergies   Other, Penicillins, Tree extract, and Amoxicillin   Review of Systems Review of Systems  Constitutional: Negative for chills and fever.  Respiratory: Negative for shortness of breath.   Musculoskeletal: Positive for arthralgias and joint swelling. Negative for back pain, gait problem, myalgias and neck pain.  Skin: Positive for rash. Negative for wound.  Neurological: Negative for dizziness and headaches.     Physical Exam Triage Vital Signs ED Triage Vitals  Enc Vitals Group     BP 09/10/19  1430 (!) 151/91     Pulse Rate 09/10/19 1430 94     Resp 09/10/19 1430 18     Temp 09/10/19 1430 98.2 F (36.8 C)     Temp Source 09/10/19 1430 Oral     SpO2 09/10/19 1430 100 %     Weight 09/10/19 1426 240 lb (108.9 kg)     Height 09/10/19 1426 5\' 6"  (1.676 m)     Head Circumference --      Peak Flow --      Pain Score 09/10/19 1426 8     Pain Loc --      Pain Edu? --  Excl. in GC? --    No data found.  Updated Vital Signs BP (!) 151/91 (BP Location: Left Arm)   Pulse 94   Temp 98.2 F (36.8 C) (Oral)   Resp 18   Ht 5\' 6"  (1.676 m)   Wt 240 lb (108.9 kg)   SpO2 100%   BMI 38.74 kg/m   Visual Acuity Right Eye Distance:   Left Eye Distance:   Bilateral Distance:    Right Eye Near:   Left Eye Near:    Bilateral Near:     Physical Exam Constitutional:      Appearance: She is well-developed.  HENT:     Head: Normocephalic and atraumatic.     Right Ear: External ear normal.     Left Ear: External ear normal.     Nose: Nose normal.  Eyes:     Conjunctiva/sclera: Conjunctivae normal.     Pupils: Pupils are equal, round, and reactive to light.  Cardiovascular:     Rate and Rhythm: Normal rate.  Pulmonary:     Effort: Pulmonary effort is normal. No respiratory distress.     Breath sounds: Normal breath sounds.  Abdominal:     Palpations: Abdomen is soft.     Tenderness: There is no abdominal tenderness.  Musculoskeletal:        General: No deformity.     Cervical back: Normal range of motion.     Comments: Examination of the right upper extremity shows no tenderness throughout the elbow or swelling throughout the elbow or forearm.  She has very minimal tenderness along the dorsal aspect of the wrist but has moderate soft tissue swelling along the anatomical snuffbox with tenderness along the anatomical snuffbox.  She has pain with axial thumb loading.  No catching triggering locking.  No tendon deficits noted.  No skin breakdown noted.  Examination of the right  hip shows good internal X rotation with no discomfort.  She is nontender palpation along the lateral aspect of the hip.  She ambulates with no intact gait.  Skin:    General: Skin is warm and dry.     Findings: No rash.  Neurological:     General: No focal deficit present.     Mental Status: She is alert and oriented to person, place, and time.     Cranial Nerves: No cranial nerve deficit.     Coordination: Coordination normal.  Psychiatric:        Behavior: Behavior normal.      UC Treatments / Results  Labs (all labs ordered are listed, but only abnormal results are displayed) Labs Reviewed - No data to display  EKG   Radiology DG Wrist Complete Right  Result Date: 09/10/2019 CLINICAL DATA:  Fall, right wrist/arm pain EXAM: RIGHT WRIST - COMPLETE 3+ VIEW COMPARISON:  None. FINDINGS: No fracture or dislocation is seen. The joint spaces are preserved. Visualized soft tissues are within normal limits. IMPRESSION: Negative. Electronically Signed   By: Julian Hy M.D.   On: 09/10/2019 14:40    Procedures Procedures (including critical care time)  Medications Ordered in UC Medications - No data to display  Initial Impression / Assessment and Plan / UC Course  I have reviewed the triage vital signs and the nursing notes.  Pertinent labs & imaging results that were available during my care of the patient were reviewed by me and considered in my medical decision making (see chart for details).    54 year old female with fall onto the  dorsal aspect of the right hand and wrist.  Pain and swelling along the anatomical snuffbox of the right wrist.  X-rays negative today for fracture but due to area of pain and swelling will treat for possible scaphoid fracture.  She is placed into a thumb spica splint.  She will wear at all times except for showering and she will call orthopedic office to schedule follow-up appointment for repeat imaging and evaluation.  She will continue with  her meloxicam and add Tylenol as needed for additional pain relief.  She is given a note remain out of work for 1 week. Final Clinical Impressions(s) / UC Diagnoses   Final diagnoses:  Fall, initial encounter  Traumatic hematoma of right wrist, initial encounter  Right wrist sprain, initial encounter     Discharge Instructions     Please continue with meloxicam daily.  You may take Tylenol for additional pain relief.  Apply ice to the area 20 minutes every hour.  Wear thumb spica splint at all times except for showering.  Please follow-up with orthopedics in 5 to 7 days for repeat evaluation and x-ray.  Your x-rays today were negative but due to tenderness along the scaphoid, swelling along the scaphoid there is concern for possible scaphoid fracture.   ED Prescriptions    Medication Sig Dispense Auth. Provider   acetaminophen (TYLENOL) 500 MG tablet Take 2 tablets (1,000 mg total) by mouth every 6 (six) hours as needed. 30 tablet Evon Slack, PA-C     PDMP not reviewed this encounter.   Evon Slack, PA-C 09/10/19 1520

## 2019-09-26 ENCOUNTER — Telehealth: Payer: Self-pay | Admitting: Internal Medicine

## 2019-09-26 NOTE — Telephone Encounter (Signed)
Office not form [facility] received and placed at your desk.

## 2019-10-05 ENCOUNTER — Telehealth: Payer: Self-pay | Admitting: Internal Medicine

## 2019-10-05 NOTE — Telephone Encounter (Signed)
Pt called in and said she has knot on the right leg that is sore and she has varicose veins. She said she called [facility] and they told her she needs a referral. She said it just started yesterday.

## 2019-10-05 NOTE — Telephone Encounter (Signed)
Okay for referral?

## 2019-10-10 NOTE — Telephone Encounter (Signed)
Referral placed urgently [facility]    Thanks  tMS

## 2019-10-10 NOTE — Addendum Note (Signed)
Addended by: Quentin Ore on: 10/10/2019 12:51 AM   Modules accepted: Orders

## 2019-10-11 DIAGNOSIS — I8001 Phlebitis and thrombophlebitis of superficial vessels of right lower extremity: Secondary | ICD-10-CM | POA: Diagnosis not present

## 2019-10-11 DIAGNOSIS — I803 Phlebitis and thrombophlebitis of lower extremities, unspecified: Secondary | ICD-10-CM | POA: Diagnosis not present

## 2019-10-11 DIAGNOSIS — R2241 Localized swelling, mass and lump, right lower limb: Secondary | ICD-10-CM | POA: Diagnosis not present

## 2019-10-11 DIAGNOSIS — R21 Rash and other nonspecific skin eruption: Secondary | ICD-10-CM | POA: Diagnosis not present

## 2019-10-13 ENCOUNTER — Other Ambulatory Visit (INDEPENDENT_AMBULATORY_CARE_PROVIDER_SITE_OTHER): Payer: Self-pay | Admitting: Nurse Practitioner

## 2019-10-13 DIAGNOSIS — I83899 Varicose veins of unspecified lower extremities with other complications: Secondary | ICD-10-CM

## 2019-10-14 ENCOUNTER — Other Ambulatory Visit: Payer: Self-pay

## 2019-10-14 ENCOUNTER — Encounter (INDEPENDENT_AMBULATORY_CARE_PROVIDER_SITE_OTHER): Payer: Federal, State, Local not specified - PPO

## 2019-10-14 ENCOUNTER — Encounter (INDEPENDENT_AMBULATORY_CARE_PROVIDER_SITE_OTHER): Payer: Self-pay | Admitting: Nurse Practitioner

## 2019-10-14 ENCOUNTER — Ambulatory Visit (INDEPENDENT_AMBULATORY_CARE_PROVIDER_SITE_OTHER): Payer: Federal, State, Local not specified - PPO | Admitting: Nurse Practitioner

## 2019-10-14 VITALS — BP 131/83 | HR 68 | Resp 16 | Wt 247.0 lb

## 2019-10-14 DIAGNOSIS — I83813 Varicose veins of bilateral lower extremities with pain: Secondary | ICD-10-CM

## 2019-10-14 DIAGNOSIS — I82811 Embolism and thrombosis of superficial veins of right lower extremities: Secondary | ICD-10-CM | POA: Diagnosis not present

## 2019-10-14 NOTE — Progress Notes (Signed)
SUBJECTIVE:  Patient ID: Hannah Robinson, female    DOB: 02-06-1966, 54 y.o.   MRN: [id number] Chief Complaint  Patient presents with  . New Patient (Initial Visit)    ref McLean-Scocuzza for varicose veins    HPI  Hannah Robinson is a 54 y.o. female that presents today for evaluation of varicose veins after subsequent diagnosis of a superficial thrombophlebitis.  The patient has always struggled with varicose veins however she recently saw a dermatologist for concern about a rash on her right lower extremity but was also having a painful corn area on her calf.  Ultrasound ultimately showed that this was a superficial thrombophlebitis.  The patient has been using NSAIDs and elevation patient states that it is still somewhat tender but it is less than what it was.  She denies any fever, chills, nausea, vomiting or diarrhea.  She denies any other previous history of DVT or superficial venous thrombosis.  The patient does note that she has not worn medical grade 1 compression stockings previously.  The patient does state that prior to this incident she had swelling and heaviness in her right lower extremity.  She states that she has had burning and stinging irritation that has recently become lifestyle limiting.  Past Medical History:  Diagnosis Date  . Anemia    iron infusions in past  . Arthritis    right knee (also torn meniscus)  . Childhood asthma   . Eczema   . Iritis    Followed by Dr. Marti Sleigh  . Motion sickness    back seat cars  . Prediabetes   . Urinary tract bacterial infections     Past Surgical History:  Procedure Laterality Date  . BREAST BIOPSY  1988  . CATARACT EXTRACTION W/ INTRAOCULAR LENS IMPLANT Left    b/l eye surgery for cataracts b/l right eye blurry after surgery   . COLONOSCOPY WITH PROPOFOL N/A 03/05/2017   Procedure: COLONOSCOPY WITH PROPOFOL;  Surgeon: Midge Minium, MD;  Location: [facility];  Service: Endoscopy;  Laterality: N/A;  . DILATION  AND CURETTAGE OF UTERUS  1979  . VAGINAL DELIVERY     2    Social History   Socioeconomic History  . Marital status: Single    Spouse name: Not on file  . Number of children: 2  . Years of education: Not on file  . Highest education level: Not on file  Occupational History  . Occupation: Mail carrier  Tobacco Use  . Smoking status: Never Smoker  . Smokeless tobacco: Never Used  Substance and Sexual Activity  . Alcohol use: Yes    Alcohol/week: 11.0 standard drinks    Types: 4 Standard drinks or equivalent, 7 Glasses of wine per week    Comment: Wine  . Drug use: No  . Sexual activity: Not on file  Other Topics Concern  . Not on file  Social History Narrative   Lives in [place] with sons, from [place].      Work - Runner, broadcasting/film/video, Forensic scientist   Diet - regular   Exercise - walks with work   Social Determinants of Corporate investment banker Strain:   . Difficulty of Paying Living Expenses:   Food Insecurity:   . Worried About Programme researcher, broadcasting/film/video in the Last Year:   . Barista in the Last Year:   Transportation Needs:   . Freight forwarder (Medical):   Marland Kitchen Lack of Transportation (Non-Medical):   Physical  Activity:   . Days of Exercise per Week:   . Minutes of Exercise per Session:   Stress:   . Feeling of Stress :   Social Connections:   . Frequency of Communication with Friends and Family:   . Frequency of Social Gatherings with Friends and Family:   . Attends Religious Services:   . Active Member of Clubs or Organizations:   . Attends Banker Meetings:   Marland Kitchen Marital Status:   Intimate Partner Violence:   . Fear of Current or Ex-Partner:   . Emotionally Abused:   Marland Kitchen Physically Abused:   . Sexually Abused:     Family History  Problem Relation Age of Onset  . Alcohol abuse Maternal Aunt   . Clotting disorder Mother   . Arthritis Mother   . Heart disease Mother   . Bipolar disorder Brother   . Stroke Maternal Grandmother   . Cancer  Maternal Aunt        lung  . Asthma Son     Allergies  Allergen Reactions  . Other Hives    Allergic to tree nuts, but not to peanuts  . Penicillins Hives  . Tree Extract Hives  . Amoxicillin Rash     Review of Systems   Review of Systems: Negative Unless Checked Constitutional: [] Weight loss  [] Fever  [] Chills Cardiac: [] Chest pain   []  Atrial Fibrillation  [] Palpitations   [] Shortness of breath when laying flat   [] Shortness of breath with exertion. [] Shortness of breath at rest Vascular:  [] Pain in legs with walking   [] Pain in legs with standing [] Pain in legs when laying flat   [] Claudication    [] Pain in feet when laying flat    [] History of DVT   [] Phlebitis   [x] Swelling in legs   [] Varicose veins   [] Non-healing ulcers Pulmonary:   [] Uses home oxygen   [] Productive cough   [] Hemoptysis   [] Wheeze  [] COPD   [] Asthma Neurologic:  [] Dizziness   [] Seizures  [] Blackouts [] History of stroke   [] History of TIA  [] Aphasia   [] Temporary Blindness   [] Weakness or numbness in arm   [] Weakness or numbness in leg Musculoskeletal:   [] Joint swelling   [] Joint pain   [] Low back pain  []  History of Knee Replacement [] Arthritis [] back Surgeries  []  Spinal Stenosis    Hematologic:  [] Easy bruising  [] Easy bleeding   [] Hypercoagulable state   [] Anemic Gastrointestinal:  [] Diarrhea   [] Vomiting  [x] Gastroesophageal reflux/heartburn   [] Difficulty swallowing. [] Abdominal pain Genitourinary:  [] Chronic kidney disease   [] Difficult urination  [] Anuric   [] Blood in urine [] Frequent urination  [] Burning with urination   [] Hematuria Skin:  [x] Rashes   [] Ulcers [] Wounds Psychological:  [] History of anxiety   []  History of major depression  []  Memory Difficulties      OBJECTIVE:   Physical Exam  BP 131/83 (BP Location: Right Arm)   Pulse 68   Resp 16   Wt 247 lb (112 kg)   BMI 39.87 kg/m   Gen: WD/WN, NAD Head: [place]/AT, No temporalis wasting.  Ear/Nose/Throat: Hearing grossly intact, nares w/o  erythema or drainage Eyes: PER, EOMI, sclera nonicteric.  Neck: Supple, no masses.  No JVD.  Pulmonary:  Good air movement, no use of accessory muscles.  Cardiac: RRR Vascular:  Phlebitis on right lower extremity medial and posterior calf. Vessel Right Left  Dorsalis Pedis Palpable Palpable  Posterior Tibial Palpable Palpable   Gastrointestinal: soft, non-distended. No guarding/no peritoneal signs.  Musculoskeletal:  M/S 5/5 throughout.  No deformity or atrophy.  Neurologic: Pain and light touch intact in extremities.  Symmetrical.  Speech is fluent. Motor exam as listed above. Psychiatric: Judgment intact, Mood & affect appropriate for pt's clinical situation. Dermatologic: No Venous rashes. No Ulcers Noted.  No changes consistent with cellulitis. Lymph : No Cervical lymphadenopathy, no lichenification or skin changes of chronic lymphedema.       ASSESSMENT AND PLAN:  1. Acute superficial venous thrombosis of lower extremity, right Had a long discussion with the patient regarding superficial venous stenosis versus deep venous thrombosis.  We discussed provoked versus unprovoked thrombosis as well as the danger in 1 versus another.  The patient understands that having large varicosities places her at risk for superficial venous imposes however these are not dangerous instead they are more painful than anything.  I advised the patient to begin using 81 mg aspirin on a daily basis prior to her follow-up to help with treatment of her superficial thrombosis.  Advised her to utilize enteric-coated.  Patient does not need additional anticoagulation at this time.   2. Varicose veins of both lower extremities with pain  Recommend:  The patient has large symptomatic varicose veins that are painful and associated with swelling.  I have had a long discussion with the patient regarding  varicose veins and why they cause symptoms.  Patient will continue wearing graduated compression stockings class 1  on a daily basis, beginning first thing in the morning and removing them in the evening. The patient is instructed specifically not to sleep in the stockings.    The patient  will also begin using over-the-counter analgesics such as Motrin 600 mg po TID to help control the symptoms.    In addition, behavioral modification including elevation during the day will be initiated.    Pending the results of these changes the  patient will be reevaluated in 8 weeks.   An  ultrasound of the venous system will be obtained.   Further plans will be based on the ultrasound results and whether conservative therapies are successful at eliminating the pain and swelling.    Current Outpatient Medications on File Prior to Visit  Medication Sig Dispense Refill  . acetaminophen (TYLENOL) 500 MG tablet Take 2 tablets (1,000 mg total) by mouth every 6 (six) hours as needed. 30 tablet 0  . albuterol (PROAIR HFA) 108 (90 Base) MCG/ACT inhaler Inhale 2 puffs into the lungs every 4 hours as needed 8.5 g 11  . brimonidine (ALPHAGAN) 0.2 % ophthalmic solution Apply to eye.    . cetirizine (ZYRTEC) 10 MG tablet Take 1 tablet (10 mg total) by mouth daily. (Patient taking differently: Take 10 mg by mouth daily as needed. ) 30 tablet 11  . dorzolamide (TRUSOPT) 2 % ophthalmic solution Apply to eye.    . DUREZOL 0.05 % EMUL PLACE 1 DROP b/l bid  6  . ferrous sulfate 325 (65 FE) MG tablet Take 325 mg by mouth daily with breakfast.    . fluticasone (FLONASE) 50 MCG/ACT nasal spray Place 2 sprays into both nostrils daily. 16 g 8  . fluticasone furoate-vilanterol (BREO ELLIPTA) 100-25 MCG/INH AEPB TAKE 1 PUFF BY MOUTH EVERY DAY rinse mouth 60 each 12  . hydrocortisone 2.5 % cream Apply topically 2 (two) times daily. Face and neck 60 g 11  . meloxicam (MOBIC) 15 MG tablet meloxicam 15 mg tablet  TAKE 1 TABLET(S)daily prn DO NOT TAKE WITH IBUPROFEN 90 tablet 1  . triamcinolone  cream (KENALOG) 0.1 % Apply 1 application topically 2  (two) times daily. As needed 454 g 1  . vitamin B-12 (CYANOCOBALAMIN) 1000 MCG tablet Take 1 tablet (1,000 mcg total) by mouth daily. 90 tablet 3  . [DISCONTINUED] norethindrone (MICRONOR,CAMILA,ERRIN) 0.35 MG tablet Take 1 tablet by mouth daily.     No current facility-administered medications on file prior to visit.    There are no Patient Instructions on file for this visit. No follow-ups on file.   Kris Hartmann, NP  This note was completed with Sales executive.  Any errors are purely unintentional.

## 2019-10-17 ENCOUNTER — Inpatient Hospital Stay: Payer: Federal, State, Local not specified - PPO | Attending: Oncology | Admitting: Oncology

## 2019-10-17 ENCOUNTER — Other Ambulatory Visit: Payer: Self-pay

## 2019-10-17 ENCOUNTER — Encounter: Payer: Self-pay | Admitting: Oncology

## 2019-10-17 VITALS — BP 126/86 | HR 81 | Temp 96.3°F | Resp 18 | Wt 247.0 lb

## 2019-10-17 DIAGNOSIS — Z8261 Family history of arthritis: Secondary | ICD-10-CM | POA: Insufficient documentation

## 2019-10-17 DIAGNOSIS — E669 Obesity, unspecified: Secondary | ICD-10-CM | POA: Insufficient documentation

## 2019-10-17 DIAGNOSIS — Z8249 Family history of ischemic heart disease and other diseases of the circulatory system: Secondary | ICD-10-CM | POA: Diagnosis not present

## 2019-10-17 DIAGNOSIS — Z823 Family history of stroke: Secondary | ICD-10-CM | POA: Insufficient documentation

## 2019-10-17 DIAGNOSIS — Z7289 Other problems related to lifestyle: Secondary | ICD-10-CM | POA: Diagnosis not present

## 2019-10-17 DIAGNOSIS — Z832 Family history of diseases of the blood and blood-forming organs and certain disorders involving the immune mechanism: Secondary | ICD-10-CM | POA: Insufficient documentation

## 2019-10-17 DIAGNOSIS — K449 Diaphragmatic hernia without obstruction or gangrene: Secondary | ICD-10-CM | POA: Diagnosis not present

## 2019-10-17 DIAGNOSIS — D709 Neutropenia, unspecified: Secondary | ICD-10-CM | POA: Diagnosis not present

## 2019-10-17 DIAGNOSIS — Z880 Allergy status to penicillin: Secondary | ICD-10-CM | POA: Insufficient documentation

## 2019-10-17 DIAGNOSIS — Z801 Family history of malignant neoplasm of trachea, bronchus and lung: Secondary | ICD-10-CM | POA: Insufficient documentation

## 2019-10-17 DIAGNOSIS — D509 Iron deficiency anemia, unspecified: Secondary | ICD-10-CM | POA: Diagnosis not present

## 2019-10-17 DIAGNOSIS — Z79899 Other long term (current) drug therapy: Secondary | ICD-10-CM | POA: Diagnosis not present

## 2019-10-17 DIAGNOSIS — I8001 Phlebitis and thrombophlebitis of superficial vessels of right lower extremity: Secondary | ICD-10-CM

## 2019-10-17 DIAGNOSIS — Z818 Family history of other mental and behavioral disorders: Secondary | ICD-10-CM | POA: Diagnosis not present

## 2019-10-17 DIAGNOSIS — E538 Deficiency of other specified B group vitamins: Secondary | ICD-10-CM

## 2019-10-17 DIAGNOSIS — Z836 Family history of other diseases of the respiratory system: Secondary | ICD-10-CM | POA: Diagnosis not present

## 2019-10-17 DIAGNOSIS — Z811 Family history of alcohol abuse and dependence: Secondary | ICD-10-CM | POA: Insufficient documentation

## 2019-10-17 DIAGNOSIS — R768 Other specified abnormal immunological findings in serum: Secondary | ICD-10-CM | POA: Diagnosis not present

## 2019-10-17 DIAGNOSIS — K8020 Calculus of gallbladder without cholecystitis without obstruction: Secondary | ICD-10-CM | POA: Diagnosis not present

## 2019-10-17 DIAGNOSIS — D72819 Decreased white blood cell count, unspecified: Secondary | ICD-10-CM | POA: Diagnosis present

## 2019-10-17 NOTE — Progress Notes (Signed)
Patient has recently been diagnosed with superficial blood clots.  She would like to discuss this with Dr. Cathie Hoops.

## 2019-10-17 NOTE — Progress Notes (Signed)
Hematology/Oncology follow up  note [facility] [phone] [phone]   Patient Care Team: McLean-Scocuzza, Nino Glow, MD as PCP - General (Internal Medicine)  REFERRING PROVIDER: McLean-Scocuzza, Olivia Mackie *  CHIEF COMPLAINTS/REASON FOR VISIT:  Follow-up for leukopenia  HISTORY OF PRESENTING ILLNESS:   Hannah Robinson is a  54 y.o.  female with PMH listed below was seen in consultation at the request of  McLean-Scocuzza, Olivia Mackie *  for evaluation of iron deficiency anemia leukopenia Patient recently had blood work done ordered by primary care provider. 04/26/2019, CBC showed WBC 3.3, hemoglobin 12.2, differential showed ANC 1.1.  Iron panel showed iron 42, TIBC 227, iron saturation 19, ferritin 132. Patient denies any fever, chills, frequent infection. She recently had urinary symptoms and was treated empirically for UTI.  05/04/2019 CT renal stone study Showed nonobstructing 8 x 6 mm calculus lower lobe left kidney.  Tiny calculus upper lobe right kidney.  No hydronephrosis or ureteral calculus on either side.  Mild thickening of urinary bladder wall potentially representing degree of cystitis.  Cholelithiasis.  Small hiatal hernia.   Symptoms.  No fever, chills, constitutional symptoms.  INTERVAL HISTORY Hannah Robinson is a 55 y.o. female who has above history reviewed by me today presents for follow up visit for  Superficial thrombophlebitis Problems and complaints are listed below: Patient had noticed knots on right anterior shin.  History of varicose veins. She recently was seen by dermatologist and she had ultrasound done to rule out blood clots. 10/11/2019, ultrasound lower extremity showed no evidence of DVT.  Right lower extremity acute superficial thrombophlebitis.  Patient has been referred to vascular surgeon for discussion. She was also referred back to cancer center for discussion of need of anticoagulation. Today patient reports  that the tenderness has improved.  She was recommended to start aspirin 81 mg by vascular surgery and has not started yet.   Review of Systems  Constitutional: Negative for appetite change, chills, fatigue and fever.  HENT:   Negative for hearing loss and voice change.   Eyes: Negative for eye problems.  Respiratory: Negative for chest tightness and cough.   Cardiovascular: Negative for chest pain.  Gastrointestinal: Negative for abdominal distention, abdominal pain and blood in stool.  Endocrine: Negative for hot flashes.  Genitourinary: Negative for difficulty urinating and frequency.   Musculoskeletal: Negative for arthralgias.       See HPI  Skin: Negative for itching and rash.  Neurological: Negative for extremity weakness.  Hematological: Negative for adenopathy.  Psychiatric/Behavioral: Negative for confusion.    MEDICAL HISTORY:  Past Medical History:  Diagnosis Date  . Anemia    iron infusions in past  . Arthritis    right knee (also torn meniscus)  . Childhood asthma   . Eczema   . Iritis    Followed by Dr. Joya San  . Motion sickness    back seat cars  . Prediabetes   . Radius fracture    right 09/21/19 [facility] conservative tx Dr. Peggye Ley  . Urinary tract bacterial infections     SURGICAL HISTORY: Past Surgical History:  Procedure Laterality Date  . BREAST BIOPSY  1988  . CATARACT EXTRACTION W/ INTRAOCULAR LENS IMPLANT Left    b/l eye surgery for cataracts b/l right eye blurry after surgery   . COLONOSCOPY WITH PROPOFOL N/A 03/05/2017   Procedure: COLONOSCOPY WITH PROPOFOL;  Surgeon: Lucilla Lame, MD;  Location: [facility];  Service: Endoscopy;  Laterality: N/A;  . DILATION AND  CURETTAGE OF UTERUS  1979  . VAGINAL DELIVERY     2    SOCIAL HISTORY: Social History   Socioeconomic History  . Marital status: Single    Spouse name: Not on file  . Number of children: 2  . Years of education: Not on file  . Highest education level: Not on file    Occupational History  . Occupation: Mail carrier  Tobacco Use  . Smoking status: Never Smoker  . Smokeless tobacco: Never Used  Substance and Sexual Activity  . Alcohol use: Yes    Alcohol/week: 11.0 standard drinks    Types: 4 Standard drinks or equivalent, 7 Glasses of wine per week    Comment: Wine  . Drug use: No  . Sexual activity: Not on file  Other Topics Concern  . Not on file  Social History Narrative   Lives in [place] with sons, from [place].      Work - Runner, broadcasting/film/video, Forensic scientist   Diet - regular   Exercise - walks with work   Social Determinants of Corporate investment banker Strain:   . Difficulty of Paying Living Expenses:   Food Insecurity:   . Worried About Programme researcher, broadcasting/film/video in the Last Year:   . Barista in the Last Year:   Transportation Needs:   . Freight forwarder (Medical):   Marland Kitchen Lack of Transportation (Non-Medical):   Physical Activity:   . Days of Exercise per Week:   . Minutes of Exercise per Session:   Stress:   . Feeling of Stress :   Social Connections:   . Frequency of Communication with Friends and Family:   . Frequency of Social Gatherings with Friends and Family:   . Attends Religious Services:   . Active Member of Clubs or Organizations:   . Attends Banker Meetings:   Marland Kitchen Marital Status:   Intimate Partner Violence:   . Fear of Current or Ex-Partner:   . Emotionally Abused:   Marland Kitchen Physically Abused:   . Sexually Abused:     FAMILY HISTORY: Family History  Problem Relation Age of Onset  . Alcohol abuse Maternal Aunt   . Clotting disorder Mother   . Arthritis Mother   . Heart disease Mother   . Bipolar disorder Brother   . Stroke Maternal Grandmother   . Cancer Maternal Aunt        lung  . Asthma Son     ALLERGIES:  is allergic to other; penicillins; tree extract; and amoxicillin.  MEDICATIONS:  Current Outpatient Medications  Medication Sig Dispense Refill  . acetaminophen (TYLENOL) 500 MG tablet  Take 2 tablets (1,000 mg total) by mouth every 6 (six) hours as needed. 30 tablet 0  . albuterol (PROAIR HFA) 108 (90 Base) MCG/ACT inhaler Inhale 2 puffs into the lungs every 4 hours as needed 8.5 g 11  . brimonidine (ALPHAGAN) 0.2 % ophthalmic solution Apply to eye.    . cetirizine (ZYRTEC) 10 MG tablet Take 1 tablet (10 mg total) by mouth daily. (Patient taking differently: Take 10 mg by mouth daily as needed. ) 30 tablet 11  . dorzolamide (TRUSOPT) 2 % ophthalmic solution Apply to eye.    . DUREZOL 0.05 % EMUL PLACE 1 DROP b/l bid  6  . fluticasone (FLONASE) 50 MCG/ACT nasal spray Place 2 sprays into both nostrils daily. 16 g 8  . fluticasone furoate-vilanterol (BREO ELLIPTA) 100-25 MCG/INH AEPB TAKE 1 PUFF BY MOUTH EVERY  DAY rinse mouth 60 each 12  . hydrocortisone 2.5 % cream Apply topically 2 (two) times daily. Face and neck 60 g 11  . meloxicam (MOBIC) 15 MG tablet meloxicam 15 mg tablet  TAKE 1 TABLET(S)daily prn DO NOT TAKE WITH IBUPROFEN 90 tablet 1  . triamcinolone cream (KENALOG) 0.1 % Apply 1 application topically 2 (two) times daily. As needed 454 g 1  . vitamin B-12 (CYANOCOBALAMIN) 1000 MCG tablet Take 1 tablet (1,000 mcg total) by mouth daily. 90 tablet 3  . ferrous sulfate 325 (65 FE) MG tablet Take 325 mg by mouth daily with breakfast.     No current facility-administered medications for this visit.     PHYSICAL EXAMINATION: ECOG PERFORMANCE STATUS: 0 - Asymptomatic Vitals:   10/17/19 0944  BP: 126/86  Pulse: 81  Resp: 18  Temp: (!) 96.3 F (35.7 C)   Filed Weights   10/17/19 0944  Weight: 247 lb (112 kg)    Physical Exam Constitutional:      General: She is not in acute distress.    Appearance: She is obese.  HENT:     Head: Normocephalic and atraumatic.  Eyes:     General: No scleral icterus.    Pupils: Pupils are equal, round, and reactive to light.  Cardiovascular:     Rate and Rhythm: Normal rate and regular rhythm.     Heart sounds: Normal heart  sounds.  Pulmonary:     Effort: Pulmonary effort is normal. No respiratory distress.     Breath sounds: No wheezing.  Abdominal:     General: Bowel sounds are normal. There is no distension.     Palpations: Abdomen is soft. There is no mass.     Tenderness: There is no abdominal tenderness.  Musculoskeletal:        General: No swelling or deformity. Normal range of motion.     Cervical back: Normal range of motion and neck supple.     Comments: Lower extremity varicose, chronic skin changes  Skin:    General: Skin is warm and dry.     Findings: No erythema or rash.  Neurological:     Mental Status: She is alert and oriented to person, place, and time. Mental status is at baseline.     Cranial Nerves: No cranial nerve deficit.     Coordination: Coordination normal.  Psychiatric:        Mood and Affect: Mood normal.     LABORATORY DATA:  I have reviewed the data as listed Lab Results  Component Value Date   WBC 3.0 (L) 05/20/2019   HGB 12.1 05/20/2019   HCT 37.7 05/20/2019   MCV 81.1 05/20/2019   PLT 243 05/20/2019   Recent Labs    04/26/19 0928  NA 140  K 4.0  CL 105  CO2 28  GLUCOSE 98  BUN 15  CREATININE 0.61  CALCIUM 9.0  PROT 7.0  ALBUMIN 3.9  AST 18  ALT 13  ALKPHOS 74  BILITOT 0.4   Iron/TIBC/Ferritin/ %Sat    Component Value Date/Time   IRON 45 05/20/2019 1149   IRON 50 08/22/2011 1329   TIBC 262 05/20/2019 1149   TIBC 311 08/22/2011 1329   FERRITIN 130 05/20/2019 1149   FERRITIN 19 08/22/2011 1329   IRONPCTSAT 17 05/20/2019 1149   IRONPCTSAT 19 04/26/2019 0928      RADIOGRAPHIC STUDIES: I have personally reviewed the radiological images as listed and agreed with the findings in the report. No results  found. I reviewed the results of 10/11/2019 ultrasound lower extremity done at [facility].   ASSESSMENT & PLAN:  1. Thrombophlebitis of superficial veins of right lower extremity   2. Neutropenia, unspecified type (HCC)   3. Positive ANA  (antinuclear antibody)    #Right lower extremity below-knee acute superficial thrombophlebitis.  Image results were discussed with patient.  Uncomplicated, below knee, age is less than 60, no presence of bilateral SVT, no DVT, no presence of systemic infection.  Her only risk factor is chronic varicose vein.  I discussed with patient that I agree with vascular surgery that no need for anticoagulation at this point.  It is reasonable to take 81 mg aspirin. I discussed with patient about red flag symptoms of development of pulmonary embolism and deep vein thrombosis. Patient appreciates explanation.  She will start aspirin 81 mg today.  Patient was previously seen by me for leukopenia predominantly neutropenia. She also had vitamin B12 deficiency which may have contributed to neutropenia.  Continue vitamin B12 supplements. She will follow up with me in May for follow-up blood work.  positive ANA, she was previously seen by rheumatologist and was recommended to start methotrexate.  Discussed with patient that autoimmune conditions can also cause neutropenia.  I recommend her to reestablish care with rheumatology.  I am happy to refer her to rheumatology.  Patient wants to consider and she may call us for the referral.    All questions were answered. The patient knows to call the clinic with any problems questions or concerns.  cc McLean-Scocuzza, French Ana *    Return of visit: May 2021 for follow-up leukopenia and vitamin B12 deficiency   Rickard Patience, MD, PhD Hematology Oncology [facility] at [facility] Pager- [phone] 10/17/2019

## 2019-10-27 DIAGNOSIS — H02402 Unspecified ptosis of left eyelid: Secondary | ICD-10-CM | POA: Diagnosis not present

## 2019-10-27 DIAGNOSIS — H21541 Posterior synechiae (iris), right eye: Secondary | ICD-10-CM | POA: Diagnosis not present

## 2019-10-27 DIAGNOSIS — H209 Unspecified iridocyclitis: Secondary | ICD-10-CM | POA: Diagnosis not present

## 2019-10-27 DIAGNOSIS — H4043X1 Glaucoma secondary to eye inflammation, bilateral, mild stage: Secondary | ICD-10-CM | POA: Diagnosis not present

## 2019-11-16 DIAGNOSIS — M25631 Stiffness of right wrist, not elsewhere classified: Secondary | ICD-10-CM | POA: Diagnosis not present

## 2019-11-16 DIAGNOSIS — S52531D Colles' fracture of right radius, subsequent encounter for closed fracture with routine healing: Secondary | ICD-10-CM | POA: Diagnosis not present

## 2019-11-16 DIAGNOSIS — G5601 Carpal tunnel syndrome, right upper limb: Secondary | ICD-10-CM | POA: Diagnosis not present

## 2019-11-16 DIAGNOSIS — S52521D Torus fracture of lower end of right radius, subsequent encounter for fracture with routine healing: Secondary | ICD-10-CM | POA: Diagnosis not present

## 2019-11-23 DIAGNOSIS — S52521D Torus fracture of lower end of right radius, subsequent encounter for fracture with routine healing: Secondary | ICD-10-CM | POA: Diagnosis not present

## 2019-11-23 DIAGNOSIS — M25631 Stiffness of right wrist, not elsewhere classified: Secondary | ICD-10-CM | POA: Diagnosis not present

## 2019-11-30 DIAGNOSIS — S52521D Torus fracture of lower end of right radius, subsequent encounter for fracture with routine healing: Secondary | ICD-10-CM | POA: Diagnosis not present

## 2019-11-30 DIAGNOSIS — M25631 Stiffness of right wrist, not elsewhere classified: Secondary | ICD-10-CM | POA: Diagnosis not present

## 2019-12-06 DIAGNOSIS — I808 Phlebitis and thrombophlebitis of other sites: Secondary | ICD-10-CM | POA: Diagnosis not present

## 2019-12-06 DIAGNOSIS — L2084 Intrinsic (allergic) eczema: Secondary | ICD-10-CM | POA: Diagnosis not present

## 2019-12-06 DIAGNOSIS — R21 Rash and other nonspecific skin eruption: Secondary | ICD-10-CM | POA: Diagnosis not present

## 2019-12-07 DIAGNOSIS — S52521D Torus fracture of lower end of right radius, subsequent encounter for fracture with routine healing: Secondary | ICD-10-CM | POA: Diagnosis not present

## 2019-12-07 DIAGNOSIS — M25631 Stiffness of right wrist, not elsewhere classified: Secondary | ICD-10-CM | POA: Diagnosis not present

## 2019-12-08 ENCOUNTER — Other Ambulatory Visit: Payer: Federal, State, Local not specified - PPO

## 2019-12-08 ENCOUNTER — Inpatient Hospital Stay: Payer: Federal, State, Local not specified - PPO | Attending: Oncology

## 2019-12-08 ENCOUNTER — Ambulatory Visit: Payer: Federal, State, Local not specified - PPO | Admitting: Oncology

## 2019-12-08 ENCOUNTER — Other Ambulatory Visit: Payer: Self-pay

## 2019-12-08 DIAGNOSIS — Z7289 Other problems related to lifestyle: Secondary | ICD-10-CM | POA: Insufficient documentation

## 2019-12-08 DIAGNOSIS — K449 Diaphragmatic hernia without obstruction or gangrene: Secondary | ICD-10-CM | POA: Insufficient documentation

## 2019-12-08 DIAGNOSIS — I8001 Phlebitis and thrombophlebitis of superficial vessels of right lower extremity: Secondary | ICD-10-CM | POA: Insufficient documentation

## 2019-12-08 DIAGNOSIS — Z832 Family history of diseases of the blood and blood-forming organs and certain disorders involving the immune mechanism: Secondary | ICD-10-CM | POA: Diagnosis not present

## 2019-12-08 DIAGNOSIS — Z823 Family history of stroke: Secondary | ICD-10-CM | POA: Insufficient documentation

## 2019-12-08 DIAGNOSIS — Z811 Family history of alcohol abuse and dependence: Secondary | ICD-10-CM | POA: Diagnosis not present

## 2019-12-08 DIAGNOSIS — Z801 Family history of malignant neoplasm of trachea, bronchus and lung: Secondary | ICD-10-CM | POA: Insufficient documentation

## 2019-12-08 DIAGNOSIS — Z818 Family history of other mental and behavioral disorders: Secondary | ICD-10-CM | POA: Diagnosis not present

## 2019-12-08 DIAGNOSIS — Z8261 Family history of arthritis: Secondary | ICD-10-CM | POA: Insufficient documentation

## 2019-12-08 DIAGNOSIS — Z836 Family history of other diseases of the respiratory system: Secondary | ICD-10-CM | POA: Insufficient documentation

## 2019-12-08 DIAGNOSIS — D709 Neutropenia, unspecified: Secondary | ICD-10-CM | POA: Diagnosis not present

## 2019-12-08 DIAGNOSIS — Z7982 Long term (current) use of aspirin: Secondary | ICD-10-CM | POA: Diagnosis not present

## 2019-12-08 DIAGNOSIS — Z79899 Other long term (current) drug therapy: Secondary | ICD-10-CM | POA: Insufficient documentation

## 2019-12-08 DIAGNOSIS — Z8249 Family history of ischemic heart disease and other diseases of the circulatory system: Secondary | ICD-10-CM | POA: Diagnosis not present

## 2019-12-08 DIAGNOSIS — Z880 Allergy status to penicillin: Secondary | ICD-10-CM | POA: Insufficient documentation

## 2019-12-08 DIAGNOSIS — E538 Deficiency of other specified B group vitamins: Secondary | ICD-10-CM | POA: Insufficient documentation

## 2019-12-08 DIAGNOSIS — K8020 Calculus of gallbladder without cholecystitis without obstruction: Secondary | ICD-10-CM | POA: Insufficient documentation

## 2019-12-08 LAB — COMPREHENSIVE METABOLIC PANEL
ALT: 16 U/L (ref 0–44)
AST: 19 U/L (ref 15–41)
Albumin: 3.7 g/dL (ref 3.5–5.0)
Alkaline Phosphatase: 75 U/L (ref 38–126)
Anion gap: 8 (ref 5–15)
BUN: 19 mg/dL (ref 6–20)
CO2: 24 mmol/L (ref 22–32)
Calcium: 8.7 mg/dL — ABNORMAL LOW (ref 8.9–10.3)
Chloride: 108 mmol/L (ref 98–111)
Creatinine, Ser: 0.78 mg/dL (ref 0.44–1.00)
GFR calc Af Amer: >60 mL/min (ref >60)
GFR calc non Af Amer: >60 mL/min (ref >60)
Glucose, Bld: 118 mg/dL — ABNORMAL HIGH (ref 70–99)
Potassium: 3.6 mmol/L (ref 3.5–5.1)
Sodium: 140 mmol/L (ref 135–145)
Total Bilirubin: 0.6 mg/dL (ref 0.3–1.2)
Total Protein: 7.6 g/dL (ref 6.5–8.1)

## 2019-12-08 LAB — CBC WITH DIFFERENTIAL/PLATELET
Abs Immature Granulocytes: 0.01 10*3/uL (ref 0.00–0.07)
Basophils Absolute: 0 10*3/uL (ref 0.0–0.1)
Basophils Relative: 1 %
Eosinophils Absolute: 0.1 10*3/uL (ref 0.0–0.5)
Eosinophils Relative: 3 %
HCT: 35.2 % — ABNORMAL LOW (ref 36.0–46.0)
Hemoglobin: 11.7 g/dL — ABNORMAL LOW (ref 12.0–15.0)
Immature Granulocytes: 0 %
Lymphocytes Relative: 43 %
Lymphs Abs: 1.8 10*3/uL (ref 0.7–4.0)
MCH: 26.8 pg (ref 26.0–34.0)
MCHC: 33.2 g/dL (ref 30.0–36.0)
MCV: 80.7 fL (ref 80.0–100.0)
Monocytes Absolute: 0.3 10*3/uL (ref 0.1–1.0)
Monocytes Relative: 6 %
Neutro Abs: 2 10*3/uL (ref 1.7–7.7)
Neutrophils Relative %: 47 %
Platelets: 243 10*3/uL (ref 150–400)
RBC: 4.36 MIL/uL (ref 3.87–5.11)
RDW: 15.1 % (ref 11.5–15.5)
WBC: 4.2 10*3/uL (ref 4.0–10.5)
nRBC: 0 % (ref 0.0–0.2)

## 2019-12-08 LAB — VITAMIN B12: Vitamin B-12: 271 pg/mL (ref 180–914)

## 2019-12-14 DIAGNOSIS — G5601 Carpal tunnel syndrome, right upper limb: Secondary | ICD-10-CM | POA: Diagnosis not present

## 2019-12-14 DIAGNOSIS — S52531D Colles' fracture of right radius, subsequent encounter for closed fracture with routine healing: Secondary | ICD-10-CM | POA: Diagnosis not present

## 2019-12-16 ENCOUNTER — Inpatient Hospital Stay (HOSPITAL_BASED_OUTPATIENT_CLINIC_OR_DEPARTMENT_OTHER): Payer: Federal, State, Local not specified - PPO | Admitting: Oncology

## 2019-12-16 ENCOUNTER — Other Ambulatory Visit: Payer: Self-pay

## 2019-12-16 ENCOUNTER — Other Ambulatory Visit: Payer: Federal, State, Local not specified - PPO

## 2019-12-16 ENCOUNTER — Encounter (INDEPENDENT_AMBULATORY_CARE_PROVIDER_SITE_OTHER): Payer: Federal, State, Local not specified - PPO

## 2019-12-16 ENCOUNTER — Ambulatory Visit (INDEPENDENT_AMBULATORY_CARE_PROVIDER_SITE_OTHER): Payer: Federal, State, Local not specified - PPO | Admitting: Vascular Surgery

## 2019-12-16 ENCOUNTER — Encounter: Payer: Self-pay | Admitting: Oncology

## 2019-12-16 VITALS — BP 129/73 | HR 63 | Temp 96.3°F | Resp 18 | Wt 258.2 lb

## 2019-12-16 DIAGNOSIS — D709 Neutropenia, unspecified: Secondary | ICD-10-CM

## 2019-12-16 DIAGNOSIS — Z818 Family history of other mental and behavioral disorders: Secondary | ICD-10-CM | POA: Diagnosis not present

## 2019-12-16 DIAGNOSIS — E538 Deficiency of other specified B group vitamins: Secondary | ICD-10-CM

## 2019-12-16 DIAGNOSIS — Z823 Family history of stroke: Secondary | ICD-10-CM | POA: Diagnosis not present

## 2019-12-16 DIAGNOSIS — Z880 Allergy status to penicillin: Secondary | ICD-10-CM | POA: Diagnosis not present

## 2019-12-16 DIAGNOSIS — R768 Other specified abnormal immunological findings in serum: Secondary | ICD-10-CM | POA: Diagnosis not present

## 2019-12-16 DIAGNOSIS — I8001 Phlebitis and thrombophlebitis of superficial vessels of right lower extremity: Secondary | ICD-10-CM | POA: Diagnosis not present

## 2019-12-16 DIAGNOSIS — Z811 Family history of alcohol abuse and dependence: Secondary | ICD-10-CM | POA: Diagnosis not present

## 2019-12-16 DIAGNOSIS — K449 Diaphragmatic hernia without obstruction or gangrene: Secondary | ICD-10-CM | POA: Diagnosis not present

## 2019-12-16 DIAGNOSIS — Z832 Family history of diseases of the blood and blood-forming organs and certain disorders involving the immune mechanism: Secondary | ICD-10-CM | POA: Diagnosis not present

## 2019-12-16 DIAGNOSIS — Z7982 Long term (current) use of aspirin: Secondary | ICD-10-CM | POA: Diagnosis not present

## 2019-12-16 DIAGNOSIS — Z8249 Family history of ischemic heart disease and other diseases of the circulatory system: Secondary | ICD-10-CM | POA: Diagnosis not present

## 2019-12-16 DIAGNOSIS — Z801 Family history of malignant neoplasm of trachea, bronchus and lung: Secondary | ICD-10-CM | POA: Diagnosis not present

## 2019-12-16 DIAGNOSIS — Z79899 Other long term (current) drug therapy: Secondary | ICD-10-CM | POA: Diagnosis not present

## 2019-12-16 DIAGNOSIS — K8020 Calculus of gallbladder without cholecystitis without obstruction: Secondary | ICD-10-CM | POA: Diagnosis not present

## 2019-12-16 DIAGNOSIS — Z7289 Other problems related to lifestyle: Secondary | ICD-10-CM | POA: Diagnosis not present

## 2019-12-16 DIAGNOSIS — Z8261 Family history of arthritis: Secondary | ICD-10-CM | POA: Diagnosis not present

## 2019-12-16 NOTE — Progress Notes (Signed)
Patient here for follow up. No new concerns regarding leg blood clots. Pt wants to know if its ok to start turmeric.

## 2019-12-16 NOTE — Progress Notes (Signed)
Hematology/Oncology follow up  note [facility] Telephone:(336) [phone] [phone]   Patient Care Team: McLean-Scocuzza, Pasty Spillers, MD as PCP - General (Internal Medicine)  REFERRING PROVIDER: McLean-Scocuzza, French Ana *  CHIEF COMPLAINTS/REASON FOR VISIT:  Follow-up for leukopenia  HISTORY OF PRESENTING ILLNESS:   Hannah Robinson is a  54 y.o.  female with PMH listed below was seen in consultation at the request of  McLean-Scocuzza, French Ana *  for evaluation of iron deficiency anemia leukopenia Patient recently had blood work done ordered by primary care provider. 04/26/2019, CBC showed WBC 3.3, hemoglobin 12.2, differential showed ANC 1.1.  Iron panel showed iron 42, TIBC 227, iron saturation 19, ferritin 132. Patient denies any fever, chills, frequent infection. She recently had urinary symptoms and was treated empirically for UTI.  05/04/2019 CT renal stone study Showed nonobstructing 8 x 6 mm calculus lower lobe left kidney.  Tiny calculus upper lobe right kidney.  No hydronephrosis or ureteral calculus on either side.  Mild thickening of urinary bladder wall potentially representing degree of cystitis.  Cholelithiasis.  Small hiatal hernia.   Symptoms.  No fever, chills, constitutional symptoms.  # 10/11/2019, ultrasound lower extremity showed no evidence of DVT.  Right lower extremity acute superficial thrombophlebitis.  Patient has been referred to vascular surgeon for discussion. She was also referred back to cancer center for discussion of need of anticoagulation. Today patient reports that the tenderness has improved.  She was recommended to start aspirin 81 mg  INTERVAL HISTORY Hannah SHINGLEDECKER is a 54 y.o. female who has above history reviewed by me today presents for follow up visit for  Superficial thrombophlebitis Problems and complaints are listed below: Patient reports feeling well today.  She is on aspirin 81 mg daily.  She reschedule  vascular surgery appointment.  There was a plan for repeat lower extremity ultrasound. Lower extremity chronic varicose veins. She is not taking vitamin B12 supplementation as advised.  Review of Systems  Constitutional: Negative for appetite change, chills, fatigue and fever.  HENT:   Negative for hearing loss and voice change.   Eyes: Negative for eye problems.  Respiratory: Negative for chest tightness and cough.   Cardiovascular: Negative for chest pain.  Gastrointestinal: Negative for abdominal distention, abdominal pain and blood in stool.  Endocrine: Negative for hot flashes.  Genitourinary: Negative for difficulty urinating and frequency.   Musculoskeletal: Negative for arthralgias.  Skin: Negative for itching and rash.  Neurological: Negative for extremity weakness.  Hematological: Negative for adenopathy.  Psychiatric/Behavioral: Negative for confusion.    MEDICAL HISTORY:  Past Medical History:  Diagnosis Date  . Anemia    iron infusions in past  . Arthritis    right knee (also torn meniscus)  . Childhood asthma   . Eczema   . Iritis    Followed by Dr. Marti Sleigh  . Motion sickness    back seat cars  . Prediabetes   . Radius fracture    right [facility]/21/[facility] conservative tx Dr. Stephenie Acres  . Urinary tract bacterial infections     SURGICAL HISTORY: Past Surgical History:  Procedure Laterality Date  . BREAST BIOPSY  1988  . CATARACT EXTRACTION W/ INTRAOCULAR LENS IMPLANT Left    b/l eye surgery for cataracts b/l right eye blurry after surgery   . COLONOSCOPY WITH PROPOFOL N/A 03/05/2017   Procedure: COLONOSCOPY WITH PROPOFOL;  Surgeon: Midge Minium, MD;  Location: [facility];  Service: Endoscopy;  Laterality: N/A;  . DILATION AND CURETTAGE  OF UTERUS  1979  . VAGINAL DELIVERY     2    SOCIAL HISTORY: Social History   Socioeconomic History  . Marital status: Single    Spouse name: Not on file  . Number of children: 2  . Years of education: Not on  file  . Highest education level: Not on file  Occupational History  . Occupation: Mail carrier  Tobacco Use  . Smoking status: Never Smoker  . Smokeless tobacco: Never Used  Substance and Sexual Activity  . Alcohol use: Yes    Alcohol/week: 11.0 standard drinks    Types: 4 Standard drinks or equivalent, 7 Glasses of wine per week    Comment: Wine  . Drug use: No  . Sexual activity: Not on file  Other Topics Concern  . Not on file  Social History Narrative   Lives in [place] with sons, from [place].      Work - Runner, broadcasting/film/video, Forensic scientist   Diet - regular   Exercise - walks with work   Social Determinants of Corporate investment banker Strain:   . Difficulty of Paying Living Expenses:   Food Insecurity:   . Worried About Programme researcher, broadcasting/film/video in the Last Year:   . Barista in the Last Year:   Transportation Needs:   . Freight forwarder (Medical):   Marland Kitchen Lack of Transportation (Non-Medical):   Physical Activity:   . Days of Exercise per Week:   . Minutes of Exercise per Session:   Stress:   . Feeling of Stress :   Social Connections:   . Frequency of Communication with Friends and Family:   . Frequency of Social Gatherings with Friends and Family:   . Attends Religious Services:   . Active Member of Clubs or Organizations:   . Attends Banker Meetings:   Marland Kitchen Marital Status:   Intimate Partner Violence:   . Fear of Current or Ex-Partner:   . Emotionally Abused:   Marland Kitchen Physically Abused:   . Sexually Abused:     FAMILY HISTORY: Family History  Problem Relation Age of Onset  . Alcohol abuse Maternal Aunt   . Clotting disorder Mother   . Arthritis Mother   . Heart disease Mother   . Bipolar disorder Brother   . Stroke Maternal Grandmother   . Cancer Maternal Aunt        lung  . Asthma Son     ALLERGIES:  is allergic to other; penicillins; tree extract; and amoxicillin.  MEDICATIONS:  Current Outpatient Medications  Medication Sig Dispense Refill   . acetaminophen (TYLENOL) 500 MG tablet Take 2 tablets (1,000 mg total) by mouth every 6 (six) hours as needed. 30 tablet 0  . albuterol (PROAIR HFA) 108 (90 Base) MCG/ACT inhaler Inhale 2 puffs into the lungs every 4 hours as needed 8.5 g 11  . aspirin 81 MG EC tablet Take by mouth.    . brimonidine (ALPHAGAN) 0.2 % ophthalmic solution Apply to eye.    . cetirizine (ZYRTEC) 10 MG tablet Take 1 tablet (10 mg total) by mouth daily. (Patient taking differently: Take 10 mg by mouth daily as needed. ) 30 tablet 11  . dorzolamide (TRUSOPT) 2 % ophthalmic solution Apply to eye.    . DUREZOL 0.05 % EMUL PLACE 1 DROP b/l bid  6  . ferrous sulfate 325 (65 FE) MG tablet Take 325 mg by mouth daily with breakfast.    . fluticasone (FLONASE)  50 MCG/ACT nasal spray Place 2 sprays into both nostrils daily. 16 g 8  . fluticasone furoate-vilanterol (BREO ELLIPTA) 100-25 MCG/INH AEPB TAKE 1 PUFF BY MOUTH EVERY DAY rinse mouth 60 each 12  . hydrocortisone 2.5 % cream Apply topically 2 (two) times daily. Face and neck 60 g 11  . meloxicam (MOBIC) 15 MG tablet meloxicam 15 mg tablet  TAKE 1 TABLET(S)daily prn DO NOT TAKE WITH IBUPROFEN 90 tablet 1  . triamcinolone cream (KENALOG) 0.1 % Apply 1 application topically 2 (two) times daily. As needed 454 g 1  . vitamin B-12 (CYANOCOBALAMIN) 1000 MCG tablet Take 1 tablet (1,000 mcg total) by mouth daily. 90 tablet 3   No current facility-administered medications for this visit.     PHYSICAL EXAMINATION: ECOG PERFORMANCE STATUS: 0 - Asymptomatic Vitals:   12/16/19 1332  BP: 129/73  Pulse: 63  Resp: 18  Temp: (!) 96.3 F (35.7 C)   Filed Weights   12/16/19 1332  Weight: 258 lb 3.2 oz (117.1 kg)    Physical Exam Constitutional:      General: She is not in acute distress.    Appearance: She is obese.  HENT:     Head: Normocephalic and atraumatic.  Eyes:     General: No scleral icterus.    Pupils: Pupils are equal, round, and reactive to light.    Cardiovascular:     Rate and Rhythm: Normal rate and regular rhythm.     Heart sounds: Normal heart sounds.  Pulmonary:     Effort: Pulmonary effort is normal. No respiratory distress.     Breath sounds: No wheezing.  Abdominal:     General: Bowel sounds are normal. There is no distension.     Palpations: Abdomen is soft. There is no mass.     Tenderness: There is no abdominal tenderness.  Musculoskeletal:        General: No swelling or deformity. Normal range of motion.     Cervical back: Normal range of motion and neck supple.     Comments: Lower extremity varicose, chronic skin changes  Skin:    General: Skin is warm and dry.     Findings: No erythema or rash.  Neurological:     Mental Status: She is alert and oriented to person, place, and time. Mental status is at baseline.     Cranial Nerves: No cranial nerve deficit.     Coordination: Coordination normal.  Psychiatric:        Mood and Affect: Mood normal.     LABORATORY DATA:  I have reviewed the data as listed Lab Results  Component Value Date   WBC 4.2 12/08/2019   HGB 11.7 (L) 12/08/2019   HCT 35.2 (L) 12/08/2019   MCV 80.7 12/08/2019   PLT 243 12/08/2019   Recent Labs    04/26/19 0928 12/08/19 1332  NA 140 140  K 4.0 3.6  CL 105 108  CO2 28 24  GLUCOSE 98 118*  BUN 15 19  CREATININE 0.61 0.78  CALCIUM 9.0 8.7*  GFRNONAA  --  >60  GFRAA  --  >60  PROT 7.0 7.6  ALBUMIN 3.9 3.7  AST 18 19  ALT 13 16  ALKPHOS 74 75  BILITOT 0.4 0.6   Iron/TIBC/Ferritin/ %Sat    Component Value Date/Time   IRON 45 05/20/2019 1149   IRON 50 08/22/2011 1329   TIBC 262 05/20/2019 1149   TIBC 311 08/22/2011 1329   FERRITIN 130 05/20/2019 1149  FERRITIN 19 08/22/2011 1329   IRONPCTSAT 17 05/20/2019 1149   IRONPCTSAT 19 04/26/2019 0928      RADIOGRAPHIC STUDIES: I have personally reviewed the radiological images as listed and agreed with the findings in the report. No results found. I reviewed the results  of 10/11/2019 ultrasound lower extremity done at [facility].   ASSESSMENT & PLAN:  1. Thrombophlebitis of superficial veins of right lower extremity   2. Neutropenia, unspecified type ([place])   3. Positive ANA (antinuclear antibody)   4. B12 deficiency    #History of right lower extremity below-knee acute superficial thrombophlebitis. Her clinic symptoms has improved.  She is on aspirin 81 mg. I recommend patient to continue follow-up with vascular surgery and have a repeat ultrasound of lower extremity. She has chronic lower extremity varicose and I recommend patient to have a discussion with vascular surgery regarding feasibility of varicose resection.  #Chronic leukopenia with mild neutropenia. Labs reviewed and discussed with patient.  WBC has normalized with normal differential counts. She reports recent steroid injection of right wrist but the injection was done after the blood work so it should not contribute to the normalization of blood count.  Continue monitor  #History of positive ANA, she was previously seen by rheumatologist for her eye symptoms and was recommended to start methotrexate in the past..  She did not start methotrexate.  Currently she denies eye symptoms, joint pain, skin rash.  I will defer to primary care provider whether she needs to reestablish care with rheumatologist.   #Mild hypocalcemia, I discussed with her about starting calcium 600 mg daily #Vitamin B12 level is normal, lower normal end.  Recommend patient to take vitamin B12 1000 mcg daily  All questions were answered. The patient knows to call the clinic with any problems questions or concerns.  cc McLean-Scocuzza, Olivia Mackie *    Return of visit: 6 months   Earlie Server, MD, PhD Hematology Oncology [facility] at [facility] Pager- [phone] 12/16/2019

## 2019-12-23 DIAGNOSIS — S52531D Colles' fracture of right radius, subsequent encounter for closed fracture with routine healing: Secondary | ICD-10-CM | POA: Diagnosis not present

## 2019-12-23 DIAGNOSIS — S52521D Torus fracture of lower end of right radius, subsequent encounter for fracture with routine healing: Secondary | ICD-10-CM | POA: Diagnosis not present

## 2019-12-27 DIAGNOSIS — S52531D Colles' fracture of right radius, subsequent encounter for closed fracture with routine healing: Secondary | ICD-10-CM | POA: Diagnosis not present

## 2020-01-03 ENCOUNTER — Ambulatory Visit (INDEPENDENT_AMBULATORY_CARE_PROVIDER_SITE_OTHER): Payer: Federal, State, Local not specified - PPO | Admitting: Vascular Surgery

## 2020-01-03 ENCOUNTER — Ambulatory Visit (INDEPENDENT_AMBULATORY_CARE_PROVIDER_SITE_OTHER): Payer: Federal, State, Local not specified - PPO

## 2020-01-03 ENCOUNTER — Other Ambulatory Visit: Payer: Self-pay

## 2020-01-03 ENCOUNTER — Encounter (INDEPENDENT_AMBULATORY_CARE_PROVIDER_SITE_OTHER): Payer: Self-pay | Admitting: Vascular Surgery

## 2020-01-03 VITALS — BP 136/84 | HR 75 | Ht 66.0 in | Wt 253.0 lb

## 2020-01-03 DIAGNOSIS — I83899 Varicose veins of unspecified lower extremities with other complications: Secondary | ICD-10-CM

## 2020-01-03 DIAGNOSIS — S52531D Colles' fracture of right radius, subsequent encounter for closed fracture with routine healing: Secondary | ICD-10-CM | POA: Diagnosis not present

## 2020-01-03 DIAGNOSIS — G5601 Carpal tunnel syndrome, right upper limb: Secondary | ICD-10-CM | POA: Diagnosis not present

## 2020-01-03 DIAGNOSIS — G5622 Lesion of ulnar nerve, left upper limb: Secondary | ICD-10-CM | POA: Diagnosis not present

## 2020-01-03 DIAGNOSIS — G5621 Lesion of ulnar nerve, right upper limb: Secondary | ICD-10-CM | POA: Diagnosis not present

## 2020-01-03 DIAGNOSIS — I8001 Phlebitis and thrombophlebitis of superficial vessels of right lower extremity: Secondary | ICD-10-CM

## 2020-01-03 DIAGNOSIS — G5602 Carpal tunnel syndrome, left upper limb: Secondary | ICD-10-CM | POA: Diagnosis not present

## 2020-01-03 NOTE — Assessment & Plan Note (Signed)
Duplex today shows resolution of the previous superficial thrombophlebitis with only a superficial varicose vein present in that location.  No current DVT or superficial thrombophlebitis is seen at this point. No current intervention we plan at this point.  It is possible, that as time goes on her varicose veins become more prominent and problematic.  I will plan a 38-month follow-up visit to reassess her to determine if future work-up for reflux and potential venous intervention would be prudent.

## 2020-01-03 NOTE — Progress Notes (Signed)
MRN : [id number]  Hannah Robinson is a 54 y.o. (March 11, 1966) female who presents with chief complaint of  Chief Complaint  Patient presents with  . Follow-up    8 weeks Bil LE Ven  reflux   .  History of Present Illness: Patient returns today in follow up of her right leg superficial thrombophlebitis.  She is doing well.  The firm, tender cord resolved about a month after her last visit.  She was still doing warm compresses and elevating her legs.  She continues to take aspirin daily.  She has noticed some varicosities on the right leg but not as bad as they were before her superficial thrombophlebitis.  No chest pain or shortness of breath.  Duplex today shows resolution of the previous superficial thrombophlebitis with only a superficial varicose vein present in that location.  No current DVT or superficial thrombophlebitis is seen at this point.  Current Outpatient Medications  Medication Sig Dispense Refill  . acetaminophen (TYLENOL) 500 MG tablet Take 2 tablets (1,000 mg total) by mouth every 6 (six) hours as needed. 30 tablet 0  . albuterol (PROAIR HFA) 108 (90 Base) MCG/ACT inhaler Inhale 2 puffs into the lungs every 4 hours as needed 8.5 g 11  . aspirin 81 MG EC tablet Take by mouth.    . brimonidine (ALPHAGAN) 0.2 % ophthalmic solution Apply to eye.    . cetirizine (ZYRTEC) 10 MG tablet Take 1 tablet (10 mg total) by mouth daily. (Patient taking differently: Take 10 mg by mouth daily as needed. ) 30 tablet 11  . dorzolamide (TRUSOPT) 2 % ophthalmic solution Apply to eye.    . DUREZOL 0.05 % EMUL PLACE 1 DROP b/l bid  6  . ferrous sulfate 325 (65 FE) MG tablet Take 325 mg by mouth daily with breakfast.    . fluticasone (FLONASE) 50 MCG/ACT nasal spray Place 2 sprays into both nostrils daily. 16 g 8  . fluticasone furoate-vilanterol (BREO ELLIPTA) 100-25 MCG/INH AEPB TAKE 1 PUFF BY MOUTH EVERY DAY rinse mouth 60 each 12  . meloxicam (MOBIC) 15 MG tablet meloxicam 15 mg tablet  TAKE  1 TABLET(S)daily prn DO NOT TAKE WITH IBUPROFEN 90 tablet 1  . triamcinolone cream (KENALOG) 0.1 % Apply 1 application topically 2 (two) times daily. As needed 454 g 1  . vitamin B-12 (CYANOCOBALAMIN) 1000 MCG tablet Take 1 tablet (1,000 mcg total) by mouth daily. 90 tablet 3  . hydrocortisone 2.5 % cream Apply topically 2 (two) times daily. Face and neck (Patient not taking: Reported on 01/03/2020) 60 g 11   No current facility-administered medications for this visit.    Past Medical History:  Diagnosis Date  . Anemia    iron infusions in past  . Arthritis    right knee (also torn meniscus)  . Childhood asthma   . Eczema   . Iritis    Followed by Dr. Marti Sleigh  . Motion sickness    back seat cars  . Prediabetes   . Radius fracture    right [facility] conservative tx Dr. Stephenie Acres  . Urinary tract bacterial infections     Past Surgical History:  Procedure Laterality Date  . BREAST BIOPSY  1988  . CATARACT EXTRACTION W/ INTRAOCULAR LENS IMPLANT Left    b/l eye surgery for cataracts b/l right eye blurry after surgery   . COLONOSCOPY WITH PROPOFOL N/A 03/05/2017   Procedure: COLONOSCOPY WITH PROPOFOL;  Surgeon: Midge Minium, MD;  Location: [facility];  Service: Endoscopy;  Laterality: N/A;  . DILATION AND CURETTAGE OF UTERUS  1979  . VAGINAL DELIVERY     2     Social History   Tobacco Use  . Smoking status: Never Smoker  . Smokeless tobacco: Never Used  Vaping Use  . Vaping Use: Never used  Substance Use Topics  . Alcohol use: Yes    Alcohol/week: 11.0 standard drinks    Types: 4 Standard drinks or equivalent, 7 Glasses of wine per week    Comment: Wine  . Drug use: No       Family History  Problem Relation Age of Onset  . Alcohol abuse Maternal Aunt   . Clotting disorder Mother   . Arthritis Mother   . Heart disease Mother   . Bipolar disorder Brother   . Stroke Maternal Grandmother   . Cancer Maternal Aunt        lung  . Asthma Son       Allergies  Allergen Reactions  . Other Hives    Allergic to tree nuts, but not to peanuts  . Penicillins Hives  . Tree Extract Hives  . Amoxicillin Rash     REVIEW OF SYSTEMS (Negative unless checked) Constitutional: [] ?Weight loss[] ?Fever[] ?Chills Cardiac:[] ?Chest pain[] ? Atrial Fibrillation[] ?Palpitations [] ?Shortness of breath when laying flat [] ?Shortness of breath with exertion. [] ?Shortness of breath at rest Vascular: [] ?Pain in legs with walking[] ?Pain in legswith standing[] ?Pain in legs when laying flat [] ?Claudication  [] ?Pain in feet when laying flat [] ?History of DVT [x] ?Phlebitis [x] ?Swelling in legs [x] ?Varicose veins [] ?Non-healing ulcers Pulmonary: [] ?Uses home oxygen [] ?Productive cough[] ?Hemoptysis [] ?Wheeze [] ?COPD [] ?Asthma Neurologic: [] ?Dizziness[] ?Seizures [] ?Blackouts[] ?History of stroke [] ?History of TIA[] ?Aphasia [] ?Temporary Blindness[] ?Weaknessor numbness in arm [] ?Weakness or numbnessin leg Musculoskeletal:[] ?Joint swelling [] ?Joint pain [] ?Low back pain  [] ? History of Knee Replacement [] ?Arthritis [] ?back Surgeries[] ? Spinal Stenosis  Hematologic:[] ?Easy bruising[] ?Easy bleeding [] ?Hypercoagulable state [] ?Anemic Gastrointestinal:[] ?Diarrhea [] ?Vomiting[x] ?Gastroesophageal reflux/heartburn[] ?Difficulty swallowing. [] ?Abdominal pain Genitourinary: [] ?Chronic kidney disease [] ?Difficulturination [] ?Anuric[] ?Blood in urine [] ?Frequenturination [] ?Burning with urination[] ?Hematuria Skin: [x] ?Rashes [] ?Ulcers [] ?Wounds Psychological: [] ?History of anxiety[] ?History of major depression  [] ? Memory Difficulties  Physical Examination  BP 136/84   Pulse 75   Ht 5\' 6"  (1.676 m)   Wt 253 lb (114.8 kg)   BMI 40.84 kg/m  Gen:  WD/WN, NAD Head: [place]/AT, No temporalis wasting. Ear/Nose/Throat: Hearing grossly intact, nares w/o erythema or  drainage Eyes: Conjunctiva clear. Sclera non-icteric Neck: Supple.  Trachea midline Pulmonary:  Good air movement, no use of accessory muscles.  Cardiac: RRR, no JVD Vascular: 2 mm varicosities are present in the right lower extremity with scattered varicosities present on the left. Vessel Right Left  Radial Palpable Palpable                          PT Palpable Palpable  DP Palpable Palpable   Gastrointestinal: soft, non-tender/non-distended. No guarding/reflex.  Musculoskeletal: M/S 5/5 throughout.  No deformity or atrophy.  Trace right lower extremity edema. Neurologic: Sensation grossly intact in extremities.  Symmetrical.  Speech is fluent.  Psychiatric: Judgment intact, Mood & affect appropriate for pt's clinical situation. Dermatologic: No rashes or ulcers noted.  No cellulitis or open wounds.       Labs Recent Results (from the past 2160 hour(s))  Vitamin B12     Status: None   Collection Time: 12/08/19  1:32 PM  Result Value Ref Range   Vitamin B-12 271 180 - 914 pg/mL    Comment: (NOTE) This assay is not validated for testing neonatal  or myeloproliferative syndrome specimens for Vitamin B12 levels. Performed at [facility], [address] [place], [place] [address]   Comprehensive metabolic panel     Status: Abnormal   Collection Time: 12/08/19  1:32 PM  Result Value Ref Range   Sodium 140 135 - 145 mmol/L   Potassium 3.6 3.5 - 5.1 mmol/L   Chloride 108 98 - 111 mmol/L   CO2 24 22 - 32 mmol/L   Glucose, Bld 118 (H) 70 - 99 mg/dL    Comment: Glucose reference range applies only to samples taken after fasting for at least 8 hours.   BUN 19 6 - 20 mg/dL   Creatinine, Ser 4.69 0.44 - 1.00 mg/dL   Calcium 8.7 (L) 8.9 - 10.3 mg/dL   Total Protein 7.6 6.5 - 8.1 g/dL   Albumin 3.7 3.5 - 5.0 g/dL   AST 19 15 - 41 U/L   ALT 16 0 - 44 U/L   Alkaline Phosphatase 75 38 - 126 U/L   Total Bilirubin 0.6 0.3 - 1.2 mg/dL   GFR calc non Af Amer >60 >60 mL/min    GFR calc Af Amer >60 >60 mL/min   Anion gap 8 5 - 15    Comment: Performed at [facility], [address] [place], [place] [address]  CBC with Differential     Status: Abnormal   Collection Time: 12/08/19  1:32 PM  Result Value Ref Range   WBC 4.2 4.0 - 10.5 K/uL   RBC 4.36 3.87 - 5.11 MIL/uL   Hemoglobin 11.7 (L) 12.0 - 15.0 g/dL   HCT 84.1 (L) 36 - 46 %   MCV 80.7 80.0 - 100.0 fL   MCH 26.8 26.0 - 34.0 pg   MCHC 33.2 30.0 - 36.0 g/dL   RDW 32.4 40.1 - 02.7 %   Platelets 243 150 - 400 K/uL   nRBC 0.0 0.0 - 0.2 %   Neutrophils Relative % 47 %   Neutro Abs 2.0 1.7 - 7.7 K/uL   Lymphocytes Relative 43 %   Lymphs Abs 1.8 0.7 - 4.0 K/uL   Monocytes Relative 6 %   Monocytes Absolute 0.3 0 - 1 K/uL   Eosinophils Relative 3 %   Eosinophils Absolute 0.1 0 - 0 K/uL   Basophils Relative 1 %   Basophils Absolute 0.0 0 - 0 K/uL   Immature Granulocytes 0 %   Abs Immature Granulocytes 0.01 0.00 - 0.07 K/uL    Comment: Performed at [facility], [address] [place], [place] [address]    Radiology No results found.  Assessment/Plan  Thrombophlebitis of superficial veins of right lower extremity  Duplex today shows resolution of the previous superficial thrombophlebitis with only a superficial varicose vein present in that location.  No current DVT or superficial thrombophlebitis is seen at this point. No current intervention we plan at this point.  It is possible, that as time goes on her varicose veins become more prominent and problematic.  I will plan a 40-month follow-up visit to reassess her to determine if future work-up for reflux and potential venous intervention would be prudent.    Festus Barren, MD  01/03/2020 3:38 PM    This note was created with Dragon medical transcription system.  Any errors from dictation are purely unintentional

## 2020-01-04 ENCOUNTER — Telehealth (INDEPENDENT_AMBULATORY_CARE_PROVIDER_SITE_OTHER): Payer: Self-pay

## 2020-01-04 NOTE — Telephone Encounter (Signed)
Pt called and wanted to know should she continue ASA. I called and spoke with the  Pt and made her aware that per Dr. Gilda Crease she is to continue to take her Asprin.

## 2020-01-10 DIAGNOSIS — S52531D Colles' fracture of right radius, subsequent encounter for closed fracture with routine healing: Secondary | ICD-10-CM | POA: Diagnosis not present

## 2020-01-17 DIAGNOSIS — S52531D Colles' fracture of right radius, subsequent encounter for closed fracture with routine healing: Secondary | ICD-10-CM | POA: Diagnosis not present

## 2020-01-19 DIAGNOSIS — H35373 Puckering of macula, bilateral: Secondary | ICD-10-CM | POA: Diagnosis not present

## 2020-01-19 DIAGNOSIS — H21543 Posterior synechiae (iris), bilateral: Secondary | ICD-10-CM | POA: Diagnosis not present

## 2020-01-19 DIAGNOSIS — H35353 Cystoid macular degeneration, bilateral: Secondary | ICD-10-CM | POA: Diagnosis not present

## 2020-01-19 DIAGNOSIS — H2013 Chronic iridocyclitis, bilateral: Secondary | ICD-10-CM | POA: Diagnosis not present

## 2020-01-26 DIAGNOSIS — G5601 Carpal tunnel syndrome, right upper limb: Secondary | ICD-10-CM | POA: Diagnosis not present

## 2020-01-31 DIAGNOSIS — G5601 Carpal tunnel syndrome, right upper limb: Secondary | ICD-10-CM | POA: Diagnosis not present

## 2020-02-15 ENCOUNTER — Other Ambulatory Visit: Payer: Self-pay | Admitting: Internal Medicine

## 2020-02-15 DIAGNOSIS — M1990 Unspecified osteoarthritis, unspecified site: Secondary | ICD-10-CM

## 2020-02-15 MED ORDER — MELOXICAM 15 MG PO TABS: ORAL_TABLET | ORAL | 1 refills | Status: DC

## 2020-04-09 ENCOUNTER — Other Ambulatory Visit: Payer: Self-pay | Admitting: Internal Medicine

## 2020-04-09 MED ORDER — ALBUTEROL SULFATE HFA 108 (90 BASE) MCG/ACT IN AERS: INHALATION_SPRAY | RESPIRATORY_TRACT | 11 refills | Status: DC

## 2020-05-16 DIAGNOSIS — H4043X1 Glaucoma secondary to eye inflammation, bilateral, mild stage: Secondary | ICD-10-CM | POA: Diagnosis not present

## 2020-05-24 DIAGNOSIS — H21543 Posterior synechiae (iris), bilateral: Secondary | ICD-10-CM | POA: Diagnosis not present

## 2020-05-24 DIAGNOSIS — H35373 Puckering of macula, bilateral: Secondary | ICD-10-CM | POA: Diagnosis not present

## 2020-05-24 DIAGNOSIS — H2013 Chronic iridocyclitis, bilateral: Secondary | ICD-10-CM | POA: Diagnosis not present

## 2020-05-24 DIAGNOSIS — H35353 Cystoid macular degeneration, bilateral: Secondary | ICD-10-CM | POA: Diagnosis not present

## 2020-06-22 ENCOUNTER — Telehealth: Payer: Self-pay | Admitting: Internal Medicine

## 2020-06-22 NOTE — Telephone Encounter (Signed)
Patient called want labs done before cpe on  Wednesday 06-27-20

## 2020-06-22 NOTE — Telephone Encounter (Signed)
Please advise what orders would you like placed?

## 2020-06-27 ENCOUNTER — Encounter: Payer: Self-pay | Admitting: Internal Medicine

## 2020-06-27 ENCOUNTER — Other Ambulatory Visit: Payer: Self-pay

## 2020-06-27 ENCOUNTER — Ambulatory Visit (INDEPENDENT_AMBULATORY_CARE_PROVIDER_SITE_OTHER): Payer: Federal, State, Local not specified - PPO | Admitting: Internal Medicine

## 2020-06-27 ENCOUNTER — Other Ambulatory Visit (HOSPITAL_COMMUNITY)
Admission: RE | Admit: 2020-06-27 | Discharge: 2020-06-27 | Disposition: A | Discharge status: Home / Self Care | Payer: Federal, State, Local not specified - PPO | Source: Ambulatory Visit | Attending: Internal Medicine | Admitting: Internal Medicine

## 2020-06-27 VITALS — BP 124/82 | HR 78 | Temp 97.9°F | Ht 66.0 in | Wt 248.0 lb

## 2020-06-27 DIAGNOSIS — D509 Iron deficiency anemia, unspecified: Secondary | ICD-10-CM | POA: Diagnosis not present

## 2020-06-27 DIAGNOSIS — E559 Vitamin D deficiency, unspecified: Secondary | ICD-10-CM

## 2020-06-27 DIAGNOSIS — Z6841 Body Mass Index (BMI) 40.0 and over, adult: Secondary | ICD-10-CM | POA: Insufficient documentation

## 2020-06-27 DIAGNOSIS — R7303 Prediabetes: Secondary | ICD-10-CM | POA: Diagnosis not present

## 2020-06-27 DIAGNOSIS — Z124 Encounter for screening for malignant neoplasm of cervix: Secondary | ICD-10-CM

## 2020-06-27 DIAGNOSIS — Z113 Encounter for screening for infections with a predominantly sexual mode of transmission: Secondary | ICD-10-CM | POA: Diagnosis not present

## 2020-06-27 DIAGNOSIS — B9689 Other specified bacterial agents as the cause of diseases classified elsewhere: Secondary | ICD-10-CM | POA: Insufficient documentation

## 2020-06-27 DIAGNOSIS — R319 Hematuria, unspecified: Secondary | ICD-10-CM

## 2020-06-27 DIAGNOSIS — J4520 Mild intermittent asthma, uncomplicated: Secondary | ICD-10-CM

## 2020-06-27 DIAGNOSIS — Z0000 Encounter for general adult medical examination without abnormal findings: Secondary | ICD-10-CM | POA: Diagnosis not present

## 2020-06-27 DIAGNOSIS — Z23 Encounter for immunization: Secondary | ICD-10-CM

## 2020-06-27 DIAGNOSIS — N760 Acute vaginitis: Secondary | ICD-10-CM

## 2020-06-27 DIAGNOSIS — L309 Dermatitis, unspecified: Secondary | ICD-10-CM

## 2020-06-27 DIAGNOSIS — J301 Allergic rhinitis due to pollen: Secondary | ICD-10-CM

## 2020-06-27 DIAGNOSIS — M1990 Unspecified osteoarthritis, unspecified site: Secondary | ICD-10-CM

## 2020-06-27 LAB — COMPREHENSIVE METABOLIC PANEL
ALT: 14 U/L (ref 0–35)
AST: 17 U/L (ref 0–37)
Albumin: 4.2 g/dL (ref 3.5–5.2)
Alkaline Phosphatase: 83 U/L (ref 39–117)
BUN: 15 mg/dL (ref 6–23)
CO2: 29 mEq/L (ref 19–32)
Calcium: 9.4 mg/dL (ref 8.4–10.5)
Chloride: 103 mEq/L (ref 96–112)
Creatinine, Ser: 0.74 mg/dL (ref 0.40–1.20)
GFR: 91.24 mL/min (ref >60.00)
Glucose, Bld: 87 mg/dL (ref 70–99)
Potassium: 4.1 mEq/L (ref 3.5–5.1)
Sodium: 139 mEq/L (ref 135–145)
Total Bilirubin: 0.8 mg/dL (ref 0.2–1.2)
Total Protein: 7.9 g/dL (ref 6.0–8.3)

## 2020-06-27 LAB — LIPID PANEL
Cholesterol: 169 mg/dL (ref 0–200)
HDL: 70.2 mg/dL (ref >39.00)
LDL Cholesterol: 89 mg/dL (ref 0–99)
NonHDL: 99.02
Total CHOL/HDL Ratio: 2
Triglycerides: 49 mg/dL (ref 0.0–149.0)
VLDL: 9.8 mg/dL (ref 0.0–40.0)

## 2020-06-27 LAB — CBC WITH DIFFERENTIAL/PLATELET
Basophils Absolute: 0 10*3/uL (ref 0.0–0.1)
Basophils Relative: 0.3 % (ref 0.0–3.0)
Eosinophils Absolute: 0.1 10*3/uL (ref 0.0–0.7)
Eosinophils Relative: 3.8 % (ref 0.0–5.0)
HCT: 38.2 % (ref 36.0–46.0)
Hemoglobin: 12.5 g/dL (ref 12.0–15.0)
Lymphocytes Relative: 45.9 % (ref 12.0–46.0)
Lymphs Abs: 1.6 10*3/uL (ref 0.7–4.0)
MCHC: 32.7 g/dL (ref 30.0–36.0)
MCV: 81 fl (ref 78.0–100.0)
Monocytes Absolute: 0.3 10*3/uL (ref 0.1–1.0)
Monocytes Relative: 9.1 % (ref 3.0–12.0)
Neutro Abs: 1.4 10*3/uL (ref 1.4–7.7)
Neutrophils Relative %: 40.9 % — ABNORMAL LOW (ref 43.0–77.0)
Platelets: 254 10*3/uL (ref 150.0–400.0)
RBC: 4.71 Mil/uL (ref 3.87–5.11)
RDW: 15.4 % (ref 11.5–15.5)
WBC: 3.5 10*3/uL — ABNORMAL LOW (ref 4.0–10.5)

## 2020-06-27 LAB — VITAMIN D 25 HYDROXY (VIT D DEFICIENCY, FRACTURES): VITD: 21.34 ng/mL — ABNORMAL LOW (ref 30.00–100.00)

## 2020-06-27 LAB — TSH: TSH: 1.46 u[IU]/mL (ref 0.35–4.50)

## 2020-06-27 LAB — HEMOGLOBIN A1C: Hgb A1c MFr Bld: 5.9 % (ref 4.6–6.5)

## 2020-06-27 MED ORDER — BREO ELLIPTA 100-25 MCG/INH IN AEPB: INHALATION_SPRAY | RESPIRATORY_TRACT | 12 refills | Status: DC

## 2020-06-27 MED ORDER — TRIAMCINOLONE ACETONIDE 0.1 % EX CREA: 1.0000 "application " | TOPICAL_CREAM | Freq: Two times a day (BID) | CUTANEOUS | 3 refills | Status: DC

## 2020-06-27 MED ORDER — MELOXICAM 15 MG PO TABS: ORAL_TABLET | ORAL | 1 refills | Status: DC

## 2020-06-27 MED ORDER — CETIRIZINE HCL 10 MG PO TABS: 10.0000 mg | ORAL_TABLET | Freq: Every day | ORAL | 3 refills | Status: DC | PRN

## 2020-06-27 MED ORDER — ALBUTEROL SULFATE HFA 108 (90 BASE) MCG/ACT IN AERS: INHALATION_SPRAY | RESPIRATORY_TRACT | 11 refills | Status: DC

## 2020-06-27 MED ORDER — FLUTICASONE PROPIONATE 50 MCG/ACT NA SUSP: 2.0000 | Freq: Every day | NASAL | 8 refills | Status: DC

## 2020-06-27 NOTE — Addendum Note (Signed)
Addended by: Tilford Pillar on: 06/27/2020 09:50 AM   Modules accepted: Orders

## 2020-06-27 NOTE — Patient Instructions (Signed)
Gold bond talc free powder to keep to dry  With breakouts under breast clotrimazole or lamisil otc   Intertrigo Intertrigo is skin irritation or inflammation (dermatitis) that occurs when folds of skin rub together. The irritation can cause a rash and make skin raw and itchy. This condition most commonly occurs in the skin folds of these areas:  Toes.  Armpits.  Groin.  Under the belly.  Under the breasts.  Buttocks. Intertrigo is not passed from person to person (is not contagious). What are the causes? This condition is caused by heat, moisture, rubbing (friction), and not enough air circulation. The condition can be made worse by:  Sweat.  Bacteria.  A fungus, such as yeast. What increases the risk? This condition is more likely to occur if you have moisture in your skin folds. You are more likely to develop this condition if you:  Have diabetes.  Are overweight.  Are not able to move around or are not active.  Live in a warm and moist climate.  Wear splints, braces, or other medical devices.  Are not able to control your bowels or bladder (have incontinence). What are the signs or symptoms? Symptoms of this condition include:  A pink or red skin rash in the skin fold or near the skin fold.  Raw or scaly skin.  Itchiness.  A burning feeling.  Bleeding.  Leaking fluid.  A bad smell. How is this diagnosed? This condition is diagnosed with a medical history and physical exam. You may also have a skin swab to test for bacteria or a fungus. How is this treated? This condition may be treated by:  Cleaning and drying your skin.  Taking an antibiotic medicine or using an antibiotic skin cream for a bacterial infection.  Using an antifungal cream on your skin or taking pills for an infection that was caused by a fungus, such as yeast.  Using a steroid ointment to relieve itchiness and irritation.  Separating the skin fold with a clean cotton cloth to  absorb moisture and allow air to flow into the area. Follow these instructions at home:  Keep the affected area clean and dry.  Do not scratch your skin.  Stay in a cool environment as much as possible. Use an air conditioner or fan, if available.  Apply over-the-counter and prescription medicines only as told by your health care provider.  If you were prescribed an antibiotic medicine, use it as told by your health care provider. Do not stop using the antibiotic even if your condition improves.  Keep all follow-up visits as told by your health care provider. This is important. How is this prevented?   Maintain a healthy weight.  Take care of your feet, especially if you have diabetes. Foot care includes: ? Wearing shoes that fit well. ? Keeping your feet dry. ? Wearing clean, breathable socks.  Protect the skin around your groin and buttocks, especially if you have incontinence. Skin protection includes: ? Following a regular cleaning routine. ? Using skin protectant creams, powders, or ointments. ? Changing protection pads frequently.  Do not wear tight clothes. Wear clothes that are loose, absorbent, and made of cotton.  Wear a bra that gives good support, if needed.  Shower and dry yourself well after activity or exercise. Use a hair dryer on a cool setting to dry between skin folds, especially after you bathe.  If you have diabetes, keep your blood sugar under control. Contact a health care provider if:  Your  symptoms do not improve with treatment.  Your symptoms get worse or they spread.  You notice increased redness and warmth.  You have a fever. Summary  Intertrigo is skin irritation or inflammation (dermatitis) that occurs when folds of skin rub together.  This condition is caused by heat, moisture, rubbing (friction), and not enough air circulation.  This condition may be treated by cleaning and drying your skin and with medicines.  Apply  over-the-counter and prescription medicines only as told by your health care provider.  Keep all follow-up visits as told by your health care provider. This is important. This information is not intended to replace advice given to you by your health care provider. Make sure you discuss any questions you have with your health care provider. Document Revised: 12/07/2017 Document Reviewed: 12/07/2017 Elsevier Patient Education  [place].

## 2020-06-27 NOTE — Progress Notes (Signed)
Chief Complaint  Patient presents with  . Annual Exam  . Gynecologic Exam   Annual  1. Glaucoma improved with [facility] and drops doing well not complaints  2. S/p wrist fx from fall on the job doing well right wrist    Review of Systems  Constitutional: Negative for weight loss.  HENT: Negative for hearing loss.   Eyes: Negative for blurred vision.  Respiratory: Negative for shortness of breath.   Cardiovascular: Negative for chest pain.  Gastrointestinal: Negative for abdominal pain.  Musculoskeletal: Positive for falls.  Skin: Negative for rash.  Neurological: Negative for headaches.  Psychiatric/Behavioral: Negative for depression.   Past Medical History:  Diagnosis Date  . Anemia    iron infusions in past  . Arthritis    right knee (also torn meniscus)  . Childhood asthma   . Eczema   . Iritis    Followed by Dr. Joya San  . Motion sickness    back seat cars  . Prediabetes   . Radius fracture    right 09/21/19 [facility] conservative tx Dr. Peggye Ley  . Right wrist fracture   . Urinary tract bacterial infections    Past Surgical History:  Procedure Laterality Date  . BREAST BIOPSY  1988  . CATARACT EXTRACTION W/ INTRAOCULAR LENS IMPLANT Left    b/l eye surgery for cataracts b/l right eye blurry after surgery   . COLONOSCOPY WITH PROPOFOL N/A 03/05/2017   Procedure: COLONOSCOPY WITH PROPOFOL;  Surgeon: Lucilla Lame, MD;  Location: [facility];  Service: Endoscopy;  Laterality: N/A;  . DILATION AND CURETTAGE OF UTERUS  1979  . VAGINAL DELIVERY     2   Family History  Problem Relation Age of Onset  . Alcohol abuse Maternal Aunt   . Clotting disorder Mother   . Arthritis Mother   . Heart disease Mother   . Bipolar disorder Brother   . Stroke Maternal Grandmother   . Cancer Maternal Aunt        lung  . Asthma Son    Social History   Socioeconomic History  . Marital status: Single    Spouse name: Not on file  . Number of children: 2   . Years of education: Not on file  . Highest education level: Not on file  Occupational History  . Occupation: Mail carrier  Tobacco Use  . Smoking status: Never Smoker  . Smokeless tobacco: Never Used  Vaping Use  . Vaping Use: Never used  Substance and Sexual Activity  . Alcohol use: Yes    Alcohol/week: 3.0 standard drinks    Types: 3 Glasses of wine per week    Comment: Wine 3 reg pour/week  . Drug use: No  . Sexual activity: Not on file  Other Topics Concern  . Not on file  Social History Narrative   Lives in [place] with sons, from [place].      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 Difficulty of Paying Living Expenses: Not on file  Food Insecurity:   . Worried About Charity fundraiser in the Last Year: Not on file  . Ran Out of Food in the Last Year: Not on file  Transportation Needs:   . Lack of Transportation (Medical): Not  on file  . Lack of Transportation (Non-Medical): Not on file  Physical Activity:   . Days of Exercise per Week: Not on file  . Minutes of Exercise per Session: Not on file  Stress:   . Feeling of Stress : Not on file  Social Connections:   . Frequency of Communication with Friends and Family: Not on file  . Frequency of Social Gatherings with Friends and Family: Not on file  . Attends Religious Services: Not on file  . Active Member of Clubs or Organizations: Not on file  . Attends Archivist Meetings: Not on file  . Marital Status: Not on file  Intimate Partner Violence:   . Fear of Current or Ex-Partner: Not on file  . Emotionally Abused: Not on file  . Physically Abused: Not on file  . Sexually Abused: Not on file   Current Meds  Medication Sig  . acetaminophen (TYLENOL) 500 MG tablet Take 2 tablets (1,000 mg total) by mouth every 6 (six) hours as needed.  Marland Kitchen  aspirin 81 MG EC tablet Take by mouth.  . brimonidine (ALPHAGAN) 0.2 % ophthalmic solution Apply to eye.  . dorzolamide (TRUSOPT) 2 % ophthalmic solution Apply to eye.  . DUREZOL 0.05 % EMUL PLACE 1 DROP b/l bid  . ferrous sulfate 325 (65 FE) MG tablet Take 325 mg by mouth daily with breakfast.  . hydrocortisone 2.5 % cream Apply topically 2 (two) times daily. Face and neck  . Multiple Vitamin (MULTIVITAMIN) tablet Take 1 tablet by mouth daily.  Marland Kitchen triamcinolone (KENALOG) 0.1 % Apply 1 application topically 2 (two) times daily. As needed  . TURMERIC PO Take by mouth.  . vitamin B-12 (CYANOCOBALAMIN) 1000 MCG tablet Take 1 tablet (1,000 mcg total) by mouth daily.  . [DISCONTINUED] albuterol (PROAIR HFA) 108 (90 Base) MCG/ACT inhaler Inhale 2 puffs into the lungs every 4 hours as needed  . [DISCONTINUED] cetirizine (ZYRTEC) 10 MG tablet Take 1 tablet (10 mg total) by mouth daily. (Patient taking differently: Take 10 mg by mouth daily as needed. )  . [DISCONTINUED] fluticasone (FLONASE) 50 MCG/ACT nasal spray Place 2 sprays into both nostrils daily.  . [DISCONTINUED] fluticasone furoate-vilanterol (BREO ELLIPTA) 100-25 MCG/INH AEPB TAKE 1 PUFF BY MOUTH EVERY DAY rinse mouth  . [DISCONTINUED] meloxicam (MOBIC) 15 MG tablet meloxicam 15 mg tablet  TAKE 1 TABLET(S)daily prn DO NOT TAKE WITH IBUPROFEN  . [DISCONTINUED] triamcinolone cream (KENALOG) 0.1 % Apply 1 application topically 2 (two) times daily. As needed  . albuterol (PROAIR HFA) 108 (90 Base) MCG/ACT inhaler Inhale 2 puffs into the lungs every 4 hours as needed  . cetirizine (ZYRTEC) 10 MG tablet Take 1 tablet (10 mg total) by mouth daily as needed.  . fluticasone (FLONASE) 50 MCG/ACT nasal spray Place 2 sprays into both nostrils daily.  . fluticasone furoate-vilanterol (BREO ELLIPTA) 100-25 MCG/INH AEPB TAKE 1 PUFF BY MOUTH EVERY DAY rinse mouth  . meloxicam (MOBIC) 15 MG tablet meloxicam 15 mg tablet  TAKE 1 TABLET(S)daily prn DO NOT TAKE  WITH IBUPROFEN   Allergies  Allergen Reactions  . Other Hives    Allergic to tree nuts, but not to peanuts  . Penicillins Hives  . Tree Extract Hives  . Amoxicillin Rash   No results found for this or any previous visit (from the past 2160 hour(s)). Objective  Body mass index is 40.03 kg/m. Wt Readings from Last 3 Encounters:  06/27/20 248 lb (112.5 kg)  01/03/20 253 lb (114.8 kg)  12/16/19 258 lb 3.2 oz (117.1 kg)   Temp Readings from Last 3 Encounters:  06/27/20 97.9 F (36.6 C) (Oral)  12/16/19 (!) 96.3 F (35.7 C)  10/17/19 (!) 96.3 F (35.7 C)   BP Readings from Last 3 Encounters:  06/27/20 124/82  01/03/20 136/84  12/16/19 129/73   Pulse Readings from Last 3 Encounters:  06/27/20 78  01/03/20 75  12/16/19 63    Physical Exam Vitals and nursing note reviewed. Exam conducted with a chaperone present.  Constitutional:      Appearance: Normal appearance. She is well-developed and well-groomed. She is morbidly obese.  HENT:     Head: Normocephalic and atraumatic.  Eyes:     Conjunctiva/sclera: Conjunctivae normal.     Pupils: Pupils are equal, round, and reactive to light.  Cardiovascular:     Rate and Rhythm: Normal rate and regular rhythm.     Heart sounds: Normal heart sounds. No murmur heard.   Pulmonary:     Effort: Pulmonary effort is normal.     Breath sounds: Normal breath sounds.  Chest:     Breasts: Breasts are symmetrical.        Right: Normal.        Left: Normal.  Abdominal:     Tenderness: There is no abdominal tenderness.  Genitourinary:    Pubic Area: No rash.      Labia:        Right: No rash.        Left: No rash.      Vagina: Normal.     Cervix: Discharge present.     Uterus: Normal.      Adnexa: Right adnexa normal and left adnexa normal.  Lymphadenopathy:     Upper Body:     Right upper body: No axillary adenopathy.     Left upper body: No axillary adenopathy.  Skin:    General: Skin is warm and dry.  Neurological:      General: No focal deficit present.     Mental Status: She is alert and oriented to person, place, and time. Mental status is at baseline.     Gait: Gait normal.  Psychiatric:        Attention and Perception: Attention and perception normal.        Mood and Affect: Mood and affect normal.        Speech: Speech normal.        Behavior: Behavior normal. Behavior is cooperative.        Thought Content: Thought content normal.        Cognition and Memory: Cognition and memory normal.        Judgment: Judgment normal.     Assessment  Plan  Annual physical exam - Plan: Comprehensive metabolic panel, Lipid panel, CBC with Differential/Platelet, TSH, Urinalysis, Routine w reflex microscopic flu shot today utdTdap immunemmr Not immune hep B status consider new vx in future  Consider shingrix and covid vaccine in future for now declines covid    referral mammo [facility] 3 d due 2020 pt to call and schedule  -sch 08/2020   Pap 12/11/16 neg pap neg hpv today and breast exam   Colonoscopy 03/05/17 [facility] repeat in 5 yearsDr. Allen Norris  rec healthy diet and exercise   Prediabetes - Plan: Hemoglobin A1c  IAcute vaginitis - Plan: Cervicovaginal ancillary only( )  Morbid obesity with BMI of 40.0-44.9, adult (HCC)  Eczema, unspecified type - Plan: triamcinolone (KENALOG) 0.1 %  Chronic allergic  rhinitis due to pollen - Plan: cetirizine (ZYRTEC) 10 MG tablet, fluticasone (FLONASE) 50 MCG/ACT nasal spray  Mild intermittent asthma without complication - Plan: albuterol (PROAIR HFA) 108 (90 Base) MCG/ACT inhaler, fluticasone furoate-vilanterol (BREO ELLIPTA) 100-25 MCG/INH AEPB  Arthritis - Plan: meloxicam (MOBIC) 15 MG tablet    Iron deficiency - Plan: Ambulatory referral to Hematology Leukopenia, unspecified type - Plan: Ambulatory referral to Hematology Dr. Tasia Catchings  rec iron infusions and further w/u if needed low wbc and iron def  utd colonoscopy   Provider: Dr. Olivia Mackie  [facility]

## 2020-06-28 LAB — URINALYSIS, ROUTINE W REFLEX MICROSCOPIC
Bacteria, UA: NONE SEEN /HPF
Bilirubin Urine: NEGATIVE
Glucose, UA: NEGATIVE
Hyaline Cast: NONE SEEN /LPF
Ketones, ur: NEGATIVE
Nitrite: NEGATIVE
Specific Gravity, Urine: 1.02 (ref 1.001–1.03)
pH: 6.5 (ref 5.0–8.0)

## 2020-06-28 LAB — IRON,TIBC AND FERRITIN PANEL
%SAT: 26 % (calc) (ref 16–45)
Ferritin: 141 ng/mL (ref 16–232)
Iron: 71 ug/dL (ref 45–160)
TIBC: 278 mcg/dL (calc) (ref 250–450)

## 2020-06-28 LAB — CYTOLOGY - PAP
Comment: NEGATIVE
Diagnosis: NEGATIVE
High risk HPV: NEGATIVE

## 2020-06-28 LAB — CERVICOVAGINAL ANCILLARY ONLY
Bacterial Vaginitis (gardnerella): POSITIVE — AB
Chlamydia: NEGATIVE
Comment: NEGATIVE
Comment: NEGATIVE
Comment: NEGATIVE
Comment: NORMAL
Neisseria Gonorrhea: NEGATIVE
Trichomonas: NEGATIVE

## 2020-06-28 LAB — URINE CULTURE
MICRO NUMBER:: 11292388
Result:: NO GROWTH
SPECIMEN QUALITY:: ADEQUATE

## 2020-07-02 ENCOUNTER — Other Ambulatory Visit: Payer: Self-pay | Admitting: Internal Medicine

## 2020-07-02 DIAGNOSIS — B9689 Other specified bacterial agents as the cause of diseases classified elsewhere: Secondary | ICD-10-CM

## 2020-07-02 MED ORDER — METRONIDAZOLE 500 MG PO TABS: 500.0000 mg | ORAL_TABLET | Freq: Two times a day (BID) | ORAL | 0 refills | Status: DC

## 2020-07-03 ENCOUNTER — Ambulatory Visit (INDEPENDENT_AMBULATORY_CARE_PROVIDER_SITE_OTHER): Payer: Federal, State, Local not specified - PPO | Admitting: Vascular Surgery

## 2020-07-31 ENCOUNTER — Ambulatory Visit (INDEPENDENT_AMBULATORY_CARE_PROVIDER_SITE_OTHER): Payer: Federal, State, Local not specified - PPO | Admitting: Vascular Surgery

## 2020-09-11 DIAGNOSIS — Z1231 Encounter for screening mammogram for malignant neoplasm of breast: Secondary | ICD-10-CM | POA: Diagnosis not present

## 2020-09-11 LAB — HM MAMMOGRAPHY

## 2020-09-14 ENCOUNTER — Encounter: Payer: Self-pay | Admitting: Internal Medicine

## 2020-09-19 ENCOUNTER — Encounter: Payer: Self-pay | Admitting: Internal Medicine

## 2020-09-19 ENCOUNTER — Ambulatory Visit (INDEPENDENT_AMBULATORY_CARE_PROVIDER_SITE_OTHER): Payer: Federal, State, Local not specified - PPO

## 2020-09-19 ENCOUNTER — Ambulatory Visit: Payer: Federal, State, Local not specified - PPO | Admitting: Internal Medicine

## 2020-09-19 ENCOUNTER — Other Ambulatory Visit: Payer: Self-pay

## 2020-09-19 VITALS — BP 122/82 | HR 76 | Temp 98.1°F | Ht 66.0 in | Wt 245.6 lb

## 2020-09-19 DIAGNOSIS — N760 Acute vaginitis: Secondary | ICD-10-CM | POA: Diagnosis not present

## 2020-09-19 DIAGNOSIS — B9689 Other specified bacterial agents as the cause of diseases classified elsewhere: Secondary | ICD-10-CM

## 2020-09-19 DIAGNOSIS — B351 Tinea unguium: Secondary | ICD-10-CM

## 2020-09-19 DIAGNOSIS — M25562 Pain in left knee: Secondary | ICD-10-CM | POA: Diagnosis not present

## 2020-09-19 DIAGNOSIS — M1712 Unilateral primary osteoarthritis, left knee: Secondary | ICD-10-CM | POA: Diagnosis not present

## 2020-09-19 MED ORDER — CICLOPIROX 8 % EX SOLN: Freq: Every day | CUTANEOUS | 11 refills | Status: DC

## 2020-09-19 MED ORDER — TRAMADOL HCL 50 MG PO TABS: 50.0000 mg | ORAL_TABLET | Freq: Two times a day (BID) | ORAL | 0 refills | Status: AC | PRN

## 2020-09-19 MED ORDER — METHYLPREDNISOLONE ACETATE 80 MG/ML IJ SUSP
80.0000 mg | Freq: Once | INTRAMUSCULAR | Status: AC
  Administered 2020-09-19: 80 mg via INTRAMUSCULAR

## 2020-09-19 MED ORDER — METRONIDAZOLE 500 MG PO TABS: 500.0000 mg | ORAL_TABLET | Freq: Two times a day (BID) | ORAL | 0 refills | Status: DC

## 2020-09-19 NOTE — Progress Notes (Addendum)
Chief Complaint  Patient presents with  . Leg Pain   F/u with aunt  1. C/o left knee pain 7/10 not able to go to work Mon/Tues this week no known trauma but h/o meniscus tear (right knee f/u [facility]) pain worse with walking and walking with a limp. Tried mobic and tylenol w/o help  2. C/o toenail fungus declines lamisil for now multiple toes left foot will try topical  3. BV needs refill flagyl did not take prev.   Review of Systems  Constitutional: Negative for weight loss.  HENT: Negative for hearing loss.   Eyes: Negative for blurred vision.  Respiratory: Negative for shortness of breath.   Cardiovascular: Negative for chest pain.  Genitourinary:       +BV  Musculoskeletal: Positive for joint pain.  Skin: Negative for rash.       +toenail fungus   Past Medical History:  Diagnosis Date  . Anemia    iron infusions in past  . Arthritis    right knee (also torn meniscus)  . Childhood asthma   . Eczema   . Iritis    Followed by Dr. Joya San  . Motion sickness    back seat cars  . Prediabetes   . Radius fracture    right 09/21/19 [facility] conservative tx Dr. Peggye Ley  . Right wrist fracture   . Urinary tract bacterial infections    Past Surgical History:  Procedure Laterality Date  . BREAST BIOPSY  1988  . CATARACT EXTRACTION W/ INTRAOCULAR LENS IMPLANT Left    b/l eye surgery for cataracts b/l right eye blurry after surgery   . COLONOSCOPY WITH PROPOFOL N/A 03/05/2017   Procedure: COLONOSCOPY WITH PROPOFOL;  Surgeon: Lucilla Lame, MD;  Location: [facility];  Service: Endoscopy;  Laterality: N/A;  . DILATION AND CURETTAGE OF UTERUS  1979  . VAGINAL DELIVERY     2   Family History  Problem Relation Age of Onset  . Alcohol abuse Maternal Aunt   . Clotting disorder Mother   . Arthritis Mother   . Heart disease Mother   . Bipolar disorder Brother   . Stroke Maternal Grandmother   . Cancer Maternal Aunt        lung  . Asthma Son    Social History    Socioeconomic History  . Marital status: Single    Spouse name: Not on file  . Number of children: 2  . Years of education: Not on file  . Highest education level: Not on file  Occupational History  . Occupation: Mail carrier  Tobacco Use  . Smoking status: Never Smoker  . Smokeless tobacco: Never Used  Vaping Use  . Vaping Use: Never used  Substance and Sexual Activity  . Alcohol use: Yes    Alcohol/week: 3.0 standard drinks    Types: 3 Glasses of wine per week    Comment: Wine 3 reg pour/week  . Drug use: No  . Sexual activity: Not on file  Other Topics Concern  . Not on file  Social History Narrative   Lives in [place] with sons, from [place].      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 acetaminophen (TYLENOL) 500 MG tablet Take 2 tablets (1,000 mg total) by mouth every 6 (six) hours as needed.  Marland Kitchen albuterol (PROAIR HFA) 108 (90 Base) MCG/ACT inhaler Inhale 2 puffs into the lungs every 4 hours as needed  . aspirin 81 MG EC tablet Take by mouth.  . brimonidine (ALPHAGAN) 0.2 % ophthalmic solution Apply to eye.  . cetirizine (ZYRTEC) 10 MG tablet Take 1 tablet (10 mg total) by mouth daily as needed.  . ciclopirox (PENLAC) 8 % solution Apply topically at bedtime. Apply over nail and surrounding skin. Apply daily over previous coat. After seven (7) days, may remove with alcohol and continue cycle.  . dorzolamide (TRUSOPT) 2 % ophthalmic solution Apply to eye.  . DUREZOL 0.05 % EMUL PLACE 1 DROP b/l bid  . fluticasone (FLONASE) 50 MCG/ACT nasal spray Place 2 sprays into both nostrils  daily.  . fluticasone furoate-vilanterol (BREO ELLIPTA) 100-25 MCG/INH AEPB TAKE 1 PUFF BY MOUTH EVERY DAY rinse mouth  . hydrocortisone 2.5 % cream Apply topically 2 (two) times daily. Face and neck  . meloxicam (MOBIC) 15 MG tablet meloxicam 15 mg tablet  TAKE 1 TABLET(S)daily prn DO NOT TAKE WITH IBUPROFEN  . Multiple Vitamin (MULTIVITAMIN) tablet Take 1 tablet by mouth daily.  . traMADol (ULTRAM) 50 MG tablet Take 1 tablet (50 mg total) by mouth 2 (two) times daily as needed for up to 7 days.  Marland Kitchen triamcinolone (KENALOG) 0.1 % Apply 1 application topically 2 (two) times daily. As needed  . TURMERIC PO Take by mouth.  . vitamin B-12 (CYANOCOBALAMIN) 1000 MCG tablet Take 1 tablet (1,000 mcg total) by mouth daily.   Allergies  Allergen Reactions  . Other Hives    Allergic to tree nuts, but not to peanuts  . Penicillins Hives  . Tree Extract Hives  . Amoxicillin Rash   Recent Results (from the past 2160 hour(s))  Cytology - PAP( [place])     Status: None   Collection Time: 06/27/20  8:53 AM  Result Value Ref Range   High risk HPV Negative    Adequacy      Satisfactory for evaluation; transformation zone component PRESENT.   Diagnosis      - Negative for intraepithelial lesion or malignancy (NILM)   Comment Normal Reference Range HPV - Negative   Comprehensive metabolic panel     Status: None   Collection Time: 06/27/20  8:56 AM  Result Value Ref Range   Sodium 139 135 - 145 mEq/L   Potassium 4.1 3.5 - 5.1 mEq/L   Chloride 103 96 - 112 mEq/L   CO2 29 19 - 32 mEq/L   Glucose, Bld 87 70 - 99 mg/dL   BUN 15 6 - 23 mg/dL   Creatinine, Ser 0.74 0.40 - 1.20 mg/dL   Total Bilirubin 0.8 0.2 - 1.2 mg/dL   Alkaline Phosphatase 83 39 - 117 U/L   AST 17 0 - 37 U/L   ALT 14 0 - 35 U/L   Total Protein 7.9 6.0 - 8.3 g/dL   Albumin 4.2 3.5 - 5.2 g/dL   GFR 91.24 >60.00 mL/min    Comment: Calculated using the CKD-EPI Creatinine Equation (2021)   Calcium 9.4 8.4 - 10.5 mg/dL  Lipid  panel     Status: None  Collection Time: 06/27/20  8:56 AM  Result Value Ref Range   Cholesterol 169 0 - 200 mg/dL    Comment: ATP III Classification       Desirable:  < 200 mg/dL               Borderline High:  200 - 239 mg/dL          High:  > = 240 mg/dL   Triglycerides 49.0 0.0 - 149.0 mg/dL    Comment: Normal:  <150 mg/dLBorderline High:  150 - 199 mg/dL   HDL 70.20 >39.00 mg/dL   VLDL 9.8 0.0 - 40.0 mg/dL   LDL Cholesterol 89 0 - 99 mg/dL   Total CHOL/HDL Ratio 2     Comment:                Men          Women1/2 Average Risk     3.4          3.3Average Risk          5.0          4.42X Average Risk          9.6          7.13X Average Risk          15.0          11.0                       NonHDL 99.02     Comment: NOTE:  Non-HDL goal should be 30 mg/dL higher than patient's LDL goal (i.e. LDL goal of < 70 mg/dL, would have non-HDL goal of < 100 mg/dL)  CBC with Differential/Platelet     Status: Abnormal   Collection Time: 06/27/20  8:56 AM  Result Value Ref Range   WBC 3.5 (L) 4.0 - 10.5 K/uL   RBC 4.71 3.87 - 5.11 Mil/uL   Hemoglobin 12.5 12.0 - 15.0 g/dL   HCT 38.2 36.0 - 46.0 %   MCV 81.0 78.0 - 100.0 fl   MCHC 32.7 30.0 - 36.0 g/dL   RDW 15.4 11.5 - 15.5 %   Platelets 254.0 150.0 - 400.0 K/uL   Neutrophils Relative % 40.9 (L) 43.0 - 77.0 %   Lymphocytes Relative 45.9 12.0 - 46.0 %   Monocytes Relative 9.1 3.0 - 12.0 %   Eosinophils Relative 3.8 0.0 - 5.0 %   Basophils Relative 0.3 0.0 - 3.0 %   Neutro Abs 1.4 1.4 - 7.7 K/uL   Lymphs Abs 1.6 0.7 - 4.0 K/uL   Monocytes Absolute 0.3 0.1 - 1.0 K/uL   Eosinophils Absolute 0.1 0.0 - 0.7 K/uL   Basophils Absolute 0.0 0.0 - 0.1 K/uL  TSH     Status: None   Collection Time: 06/27/20  8:56 AM  Result Value Ref Range   TSH 1.46 0.35 - 4.50 uIU/mL  Hemoglobin A1c     Status: None   Collection Time: 06/27/20  8:56 AM  Result Value Ref Range   Hgb A1c MFr Bld 5.9 4.6 - 6.5 %    Comment: Glycemic Control Guidelines for People  with Diabetes:Non Diabetic:  <6%Goal of Therapy: <7%Additional Action Suggested:  >8%   Iron, TIBC and Ferritin Panel     Status: None   Collection Time: 06/27/20  8:56 AM  Result Value Ref Range   Iron 71 45 - 160 mcg/dL   TIBC 278 250 - 450 mcg/dL (  calc)   %SAT 26 16 - 45 % (calc)   Ferritin 141 16 - 232 ng/mL  Vitamin D (25 hydroxy)     Status: Abnormal   Collection Time: 06/27/20  8:56 AM  Result Value Ref Range   VITD 21.34 (L) 30.00 - 100.00 ng/mL  Urinalysis, Routine w reflex microscopic     Status: Abnormal   Collection Time: 06/27/20  8:57 AM  Result Value Ref Range   Color, Urine YELLOW YELLOW   APPearance CLEAR CLEAR   Specific Gravity, Urine 1.020 1.001 - 1.03   pH 6.5 5.0 - 8.0   Glucose, UA NEGATIVE NEGATIVE   Bilirubin Urine NEGATIVE NEGATIVE   Ketones, ur NEGATIVE NEGATIVE   Hgb urine dipstick 1+ (A) NEGATIVE   Protein, ur 1+ (A) NEGATIVE   Nitrite NEGATIVE NEGATIVE   Leukocytes,Ua 1+ (A) NEGATIVE   WBC, UA 0-5 0 - 5 /HPF   RBC / HPF 0-2 0 - 2 /HPF   Squamous Epithelial / LPF 0-5 < OR = 5 /HPF   Bacteria, UA NONE SEEN NONE SEEN /HPF   Hyaline Cast NONE SEEN NONE SEEN /LPF  Urine Culture     Status: None   Collection Time: 06/27/20  9:18 AM   Specimen: Urine  Result Value Ref Range   MICRO NUMBER: [id number]    SPECIMEN QUALITY: Adequate    Sample Source NOT GIVEN    STATUS: FINAL    Result: No Growth   Cervicovaginal ancillary only( [place])     Status: Abnormal   Collection Time: 06/27/20  9:26 AM  Result Value Ref Range   Neisseria Gonorrhea Negative    Chlamydia Negative    Trichomonas Negative    Bacterial Vaginitis (gardnerella) Positive (A)    Comment      Normal Reference Range Bacterial Vaginosis - Negative   Comment Normal Reference Range Trichomonas - Negative    Comment Normal Reference [place] Chlamydia - Negative    Comment      Normal Reference Range Neisseria Gonorrhea - Negative  HM MAMMOGRAPHY     Status: None   Collection Time:  09/11/20 12:00 AM  Result Value Ref Range   HM Mammogram 0-4 Bi-Rad 0-4 Bi-Rad, Self Reported Normal    Comment: 09/11/20   Objective  Body mass index is 39.64 kg/m. Wt Readings from Last 3 Encounters:  09/19/20 245 lb 9.6 oz (111.4 kg)  06/27/20 248 lb (112.5 kg)  01/03/20 253 lb (114.8 kg)   Temp Readings from Last 3 Encounters:  09/19/20 98.1 F (36.7 C) (Oral)  06/27/20 97.9 F (36.6 C) (Oral)  12/16/19 (!) 96.3 F (35.7 C)   BP Readings from Last 3 Encounters:  09/19/20 122/82  06/27/20 124/82  01/03/20 136/84   Pulse Readings from Last 3 Encounters:  09/19/20 76  06/27/20 78  01/03/20 75    Physical Exam Vitals and nursing note reviewed.  Constitutional:      Appearance: Normal appearance. She is well-developed and well-groomed. She is obese.  HENT:     Head: Normocephalic and atraumatic.  Eyes:     Conjunctiva/sclera: Conjunctivae normal.     Pupils: Pupils are equal, round, and reactive to light.  Cardiovascular:     Rate and Rhythm: Normal rate and regular rhythm.     Heart sounds: Normal heart sounds. No murmur heard.   Pulmonary:     Effort: Pulmonary effort is normal.     Breath sounds: Normal breath sounds.  Musculoskeletal:  Left knee: Crepitus present. Tenderness present over the medial joint line and lateral joint line.  Skin:    General: Skin is warm and dry.  Neurological:     General: No focal deficit present.     Mental Status: She is alert and oriented to person, place, and time. Mental status is at baseline.     Gait: Gait abnormal.     Comments: Limping   Psychiatric:        Attention and Perception: Attention and perception normal.        Mood and Affect: Mood and affect normal.        Speech: Speech normal.        Behavior: Behavior normal. Behavior is cooperative.        Thought Content: Thought content normal.        Cognition and Memory: Cognition and memory normal.        Judgment: Judgment normal.     Assessment   Plan  Acute pain of left knee - Plan: DG Knee Complete 4 Views Left, traMADol (ULTRAM) 50 MG tablet Left knee brace  Consider [facility] here (Dr. Menz/Bowers) vs Dr. Kathlyn [place] in [place]  Consider PT FINDINGS: Mild medial joint space narrowing is noted. No acute fracture or dislocation is seen. No joint effusion is noted.  IMPRESSION: Mild degenerative change without acute abnormality.   Electronically Signed   By: Inez Catalina M.D.   On: 09/20/2020 10:10   Onychomycosis - Plan: ciclopirox (PENLAC) 8 % solution Consider lamisil in the future   Bacterial vaginosis  Rx Flagyl   hm flu shot utd utdTdap immunemmr Not immunehep B statusconsider new vx in future Consider shingrix and covid vaccine in future for now declines covid   [facility] 2020 pt to call and schedule -sch 09/11/20 negative   Pap 06/27/20 neg pap neg hpv +BV 06/2020 will treat   Colonoscopy 03/05/17 [facility] repeat in5yearsDr. Allen Norris  rec healthy diet and exercise   BV - Plan: Cervicovaginal ancillary only( [place])  Morbid obesity with BMI of 40.0-44.9, adult ([place]) rec healthy diet and exercise   Provider: Dr. Olivia Mackie McLean-Scocuzza-[facility]

## 2020-09-19 NOTE — Patient Instructions (Addendum)
voltaren gel left knee 4x per day  Can try heat/ice   Consider [facility] Dr. Carmin [facility] in [place] or Dr. Linna Caprice or [place] in [place]    EXAM: [facility] DIGITAL SCREENING W TOMO BILATERAL  DATE: 09/11/2020 10:58 AM  ACCESSION: [id number] UN  DICTATED: 09/11/2020 3:19 PM  INTERPRETATION LOCATION: [facility]   CLINICAL INDICATION: 55 years old Female: SCREENING MAMMOGRAM - Z12.31 - Visit for screening mammogram    COMPARISON: 2020, 2019, 2018   TECHNIQUE: Bilateral breast full field digital mammography MLO and CC views with tomosynthesis. Tomosynthesis imaging was used to improve the sensitivity and specificity of the detection of breast cancer.   BREAST DENSITY: a - The breasts are almost entirely fatty.   FINDINGS: There are no dominant masses, suspicious asymmetries or calcifications, or unexplained areas of architectural distortion in either breast. Stable postsurgical changes in the subareolar right breast.   ASSESSMENT:   BI-RADS Category: 2-Mammo1Yr : Benign. Annual mammography is recommended.    Knee Exercises Ask your health care provider which exercises are safe for you. Do exercises exactly as told by your health care provider and adjust them as directed. It is normal to feel mild stretching, pulling, tightness, or discomfort as you do these exercises. Stop right away if you feel sudden pain or your pain gets worse. Do not begin these exercises until told by your health care provider. Stretching and range-of-motion exercises These exercises warm up your muscles and joints and improve the movement and flexibility of your knee. These exercises also help to relieve pain and swelling. Knee extension, prone 1. Lie on your abdomen (prone position) on a bed. 2. Place your left / right knee just beyond the edge of the surface so your knee is not on the bed. You can put a towel under your left / right thigh just above your kneecap for comfort. 3. Relax your leg muscles and allow  gravity to straighten your knee (extension). You should feel a stretch behind your left / right knee. 4. Hold this position for __________ seconds. 5. Scoot up so your knee is supported between repetitions. Repeat __________ times. Complete this exercise __________ times a day. Knee flexion, active 1. Lie on your back with both legs straight. If this causes back discomfort, bend your left / right knee so your foot is flat on the floor. 2. Slowly slide your left / right heel back toward your buttocks. Stop when you feel a gentle stretch in the front of your knee or thigh (flexion). 3. Hold this position for __________ seconds. 4. Slowly slide your left / right heel back to the starting position. Repeat __________ times. Complete this exercise __________ times a day.   Quadriceps stretch, prone 1. Lie on your abdomen on a firm surface, such as a bed or padded floor. 2. Bend your left / right knee and hold your ankle. If you cannot reach your ankle or pant leg, loop a belt around your foot and grab the belt instead. 3. Gently pull your heel toward your buttocks. Your knee should not slide out to the side. You should feel a stretch in the front of your thigh and knee (quadriceps). 4. Hold this position for __________ seconds. Repeat __________ times. Complete this exercise __________ times a day.   Hamstring, supine 1. Lie on your back (supine position). 2. Loop a belt or towel over the ball of your left / right foot. The ball of your foot is on the walking surface, right under your toes.  3. Straighten your left / right knee and slowly pull on the belt to raise your leg until you feel a gentle stretch behind your knee (hamstring). ? Do not let your knee bend while you do this. ? Keep your other leg flat on the floor. 4. Hold this position for __________ seconds. Repeat __________ times. Complete this exercise __________ times a day. Strengthening exercises These exercises build strength and  endurance in your knee. Endurance is the ability to use your muscles for a long time, even after they get tired. Quadriceps, isometric This exercise stretches the muscles in front of your thigh (quadriceps) without moving your knee joint (isometric). 1. Lie on your back with your left / right leg extended and your other knee bent. Put a rolled towel or small pillow under your knee if told by your health care provider. 2. Slowly tense the muscles in the front of your left / right thigh. You should see your kneecap slide up toward your hip or see increased dimpling just above the knee. This motion will push the back of the knee toward the floor. 3. For __________ seconds, hold the muscle as tight as you can without increasing your pain. 4. Relax the muscles slowly and completely. Repeat __________ times. Complete this exercise __________ times a day.   Straight leg raises This exercise stretches the muscles in front of your thigh (quadriceps) and the muscles that move your hips (hip flexors). 1. Lie on your back with your left / right leg extended and your other knee bent. 2. Tense the muscles in the front of your left / right thigh. You should see your kneecap slide up or see increased dimpling just above the knee. Your thigh may even shake a bit. 3. Keep these muscles tight as you raise your leg 4-6 inches (10-15 cm) off the floor. Do not let your knee bend. 4. Hold this position for __________ seconds. 5. Keep these muscles tense as you lower your leg. 6. Relax your muscles slowly and completely after each repetition. Repeat __________ times. Complete this exercise __________ times a day. Hamstring, isometric 1. Lie on your back on a firm surface. 2. Bend your left / right knee about __________ degrees. 3. Dig your left / right heel into the surface as if you are trying to pull it toward your buttocks. Tighten the muscles in the back of your thighs (hamstring) to "dig" as hard as you can without  increasing any pain. 4. Hold this position for __________ seconds. 5. Release the tension gradually and allow your muscles to relax completely for __________ seconds after each repetition. Repeat __________ times. Complete this exercise __________ times a day. Hamstring curls If told by your health care provider, do this exercise while wearing ankle weights. Begin with __________ lb weights. Then increase the weight by 1 lb (0.5 kg) increments. Do not wear ankle weights that are more than __________ lb. 1. Lie on your abdomen with your legs straight. 2. Bend your left / right knee as far as you can without feeling pain. Keep your hips flat against the floor. 3. Hold this position for __________ seconds. 4. Slowly lower your leg to the starting position. Repeat __________ times. Complete this exercise __________ times a day.   Squats This exercise strengthens the muscles in front of your thigh and knee (quadriceps). 1. Stand in front of a table, with your feet and knees pointing straight ahead. You may rest your hands on the table for balance but  not for support. 2. Slowly bend your knees and lower your hips like you are going to sit in a chair. ? Keep your weight over your heels, not over your toes. ? Keep your lower legs upright so they are parallel with the table legs. ? Do not let your hips go lower than your knees. ? Do not bend lower than told by your health care provider. ? If your knee pain increases, do not bend as low. 3. Hold the squat position for __________ seconds. 4. Slowly push with your legs to return to standing. Do not use your hands to pull yourself to standing. Repeat __________ times. Complete this exercise __________ times a day. Wall slides This exercise strengthens the muscles in front of your thigh and knee (quadriceps). 1. Lean your back against a smooth wall or door, and walk your feet out 18-24 inches (46-61 cm) from it. 2. Place your feet hip-width  apart. 3. Slowly slide down the wall or door until your knees bend __________ degrees. Keep your knees over your heels, not over your toes. Keep your knees in line with your hips. 4. Hold this position for __________ seconds. Repeat __________ times. Complete this exercise __________ times a day.   Straight leg raises This exercise strengthens the muscles that rotate the leg at the hip and move it away from your body (hip abductors). 1. Lie on your side with your left / right leg in the top position. Lie so your head, shoulder, knee, and hip line up. You may bend your bottom knee to help you keep your balance. 2. Roll your hips slightly forward so your hips are stacked directly over each other and your left / right knee is facing forward. 3. Leading with your heel, lift your top leg 4-6 inches (10-15 cm). You should feel the muscles in your outer hip lifting. ? Do not let your foot drift forward. ? Do not let your knee roll toward the ceiling. 4. Hold this position for __________ seconds. 5. Slowly return your leg to the starting position. 6. Let your muscles relax completely after each repetition. Repeat __________ times. Complete this exercise __________ times a day.   Straight leg raises This exercise stretches the muscles that move your hips away from the front of the pelvis (hip extensors). 1. Lie on your abdomen on a firm surface. You can put a pillow under your hips if that is more comfortable. 2. Tense the muscles in your buttocks and lift your left / right leg about 4-6 inches (10-15 cm). Keep your knee straight as you lift your leg. 3. Hold this position for __________ seconds. 4. Slowly lower your leg to the starting position. 5. Let your leg relax completely after each repetition. Repeat __________ times. Complete this exercise __________ times a day. This information is not intended to replace advice given to you by your health care provider. Make sure you discuss any questions  you have with your health care provider. Document Revised: 04/27/2018 Document Reviewed: 04/27/2018 Elsevier Patient Education  2021 [employer].

## 2020-09-21 NOTE — Addendum Note (Signed)
Addended by: Quentin Ore on: 09/21/2020 06:15 PM   Modules accepted: Orders

## 2020-09-25 ENCOUNTER — Telehealth: Payer: Self-pay | Admitting: Internal Medicine

## 2020-09-25 NOTE — Telephone Encounter (Signed)
Rejection Reason - Other - Pt referred to [facility]" Gean Maidens said on Sep 25, 2020 12:10 PM  "Looks like pt has been referred to Dr. Odis Luster at [facility]" Gean Maidens said on Sep 25, 2020 12:10 PM  [facility] orthopedic surgery

## 2020-09-25 NOTE — Telephone Encounter (Signed)
[facility] called her for referral for left knee does she still need referral?

## 2020-09-26 NOTE — Telephone Encounter (Signed)
Letter sent for Patient to call [facility] to schedule

## 2020-09-27 ENCOUNTER — Ambulatory Visit: Payer: Federal, State, Local not specified - PPO | Admitting: Family Medicine

## 2020-09-27 DIAGNOSIS — H2013 Chronic iridocyclitis, bilateral: Secondary | ICD-10-CM | POA: Diagnosis not present

## 2020-09-27 DIAGNOSIS — H21543 Posterior synechiae (iris), bilateral: Secondary | ICD-10-CM | POA: Diagnosis not present

## 2020-09-27 DIAGNOSIS — H35353 Cystoid macular degeneration, bilateral: Secondary | ICD-10-CM | POA: Diagnosis not present

## 2020-09-27 DIAGNOSIS — H35373 Puckering of macula, bilateral: Secondary | ICD-10-CM | POA: Diagnosis not present

## 2020-10-10 ENCOUNTER — Ambulatory Visit: Payer: Federal, State, Local not specified - PPO | Admitting: Family Medicine

## 2020-10-10 ENCOUNTER — Other Ambulatory Visit: Payer: Self-pay

## 2020-10-10 ENCOUNTER — Encounter: Payer: Self-pay | Admitting: Family Medicine

## 2020-10-10 VITALS — BP 110/66 | HR 86 | Temp 97.5°F | Ht 66.0 in | Wt 251.0 lb

## 2020-10-10 DIAGNOSIS — M1712 Unilateral primary osteoarthritis, left knee: Secondary | ICD-10-CM | POA: Diagnosis not present

## 2020-10-10 DIAGNOSIS — M7050 Other bursitis of knee, unspecified knee: Secondary | ICD-10-CM

## 2020-10-10 DIAGNOSIS — M25562 Pain in left knee: Secondary | ICD-10-CM | POA: Diagnosis not present

## 2020-10-10 MED ORDER — PREDNISONE 20 MG PO TABS: ORAL_TABLET | ORAL | 0 refills | Status: DC

## 2020-10-10 NOTE — Progress Notes (Signed)
Hannah Ruffini T. Greidy Sherard, MD, CAQ Sports Medicine  Primary Care and Sports Medicine [facility] [address] [place], [address]  Phone: [phone]  FAX: [phone]  Hannah Robinson - 54 y.o. female  MRN [id number]  Date of Birth: February 24, 1966  Date: 10/10/2020  PCP: McLean-Scocuzza, Pasty Spillers, MD  Referral: McLean-Scocuzza, French Ana *  Chief Complaint  Patient presents with  . Knee Pain    Left    This visit occurred during the SARS-CoV-2 public health emergency.  Safety protocols were in place, including screening questions prior to the visit, additional usage of staff PPE, and extensive cleaning of exam room while observing appropriate contact time as indicated for disinfecting solutions.   Subjective:   Hannah Robinson is a 55 y.o. very pleasant female patient with Body mass index is 40.51 kg/m. who presents with the following:  She reports an incident several weeks ago where she was getting hurt her knee acutely.  She had some pain getting in her bed and then was bending it, now does not feel right.  She has altered the way in which she gets in and out of her bed.  Her pain was so severe that she had to miss 2 days of work.  She has not had any major trauma, prior fractures or operative intervention.  She has minimal effusion.  She has not had any functional giving way.  No locking up of the joint.  X-rays reviewed, does have medial compartmental OA with preservation of the lateral compartment and patellofemoral compartment. The radiological images were independently reviewed by myself in the office and results were reviewed with the patient. My independent interpretation of images:   Was getting in her bed, and then she was bendig it and then it does not feel right.  The could not get in the bed that same way.  About two weeks ago.  Had to stay out of work for a ouple of days.  Review of Systems is noted in the HPI, as appropriate    Objective:   BP 110/66   Pulse 86   Temp (!) 97.5 F (36.4 C) (Temporal)   Ht 5\' 6"  (1.676 m)   Wt 251 lb (113.9 kg)   SpO2 98%   BMI 40.51 kg/m   GEN: No acute distress; alert,appropriate. PULM: Breathing comfortably in no respiratory distress PSYCH: Normally interactive.    Left knee: Full extension.  Flexion to 125.  No pain with loading the medial lateral patellar facets.  No palpable plica that are painful.  Stable to varus and valgus stress.  Lachman as well as drawer testing is normal.  Nontender at the patella and quad tendons.  She does have some tenderness in the anserine bursa region. She also does have some medial joint line tenderness.  She does not have any significant pain with bounce home testing, McMurray's testing, flexion pinch testing, and no mechanical symptoms with testing.  Radiology: DG Knee Complete 4 Views Left  Result Date: 09/20/2020 CLINICAL DATA:  Left knee pain for several days, no known injury, initial encounter EXAM: LEFT KNEE - COMPLETE 4+ VIEW COMPARISON:  None. FINDINGS: Mild medial joint space narrowing is noted. No acute fracture or dislocation is seen. No joint effusion is noted. IMPRESSION: Mild degenerative change without acute abnormality. Electronically Signed   By: 11/20/2020 M.D.   On: 09/20/2020 10:10    Assessment and Plan:     ICD-10-CM   1. Primary  osteoarthritis of left knee  M17.12   2. Acute pain of left knee  M25.562   3. Anserine bursitis  M70.50    Osteoarthritis of the knee with exacerbation.  With a relatively acute onset, she certainly could have a small meniscal tear, and this would go along with some early arthritic changes well.  She also has some very obvious anserine bursitis.  Continue to work on motion.  Continue with oral meloxicam.  This should do pretty well with conservative treatment.  Plans as of assessment and plan, and I certainly can inject her knee at any point.  She will call me back in a month  if she is not improving.  Patient Instructions  Voltaren 1% gel. Over the counter You can apply up to 4 times a day Minimal is absorbed in the bloodstream Cost is about 9 dollars  Lidocaine 4% cream Can use as much as you want.    Meds ordered this encounter  Medications  . predniSONE (DELTASONE) 20 MG tablet    Sig: 2 tabs po daily for 5 days, then 1 tab po daily for 5 days    Dispense:  15 tablet    Refill:  0   There are no discontinued medications. No orders of the defined types were placed in this encounter.   Follow-up: No follow-ups on file.  Signed,  Elpidio Galea. Laquanda Bick, MD   Outpatient Encounter Medications as of 10/10/2020  Medication Sig  . acetaminophen (TYLENOL) 500 MG tablet Take 2 tablets (1,000 mg total) by mouth every 6 (six) hours as needed.  Marland Kitchen albuterol (PROAIR HFA) 108 (90 Base) MCG/ACT inhaler Inhale 2 puffs into the lungs every 4 hours as needed  . aspirin 81 MG EC tablet Take by mouth.  . brimonidine (ALPHAGAN) 0.2 % ophthalmic solution Apply to eye.  . cetirizine (ZYRTEC) 10 MG tablet Take 1 tablet (10 mg total) by mouth daily as needed.  . ciclopirox (PENLAC) 8 % solution Apply topically at bedtime. Apply over nail and surrounding skin. Apply daily over previous coat. After seven (7) days, may remove with alcohol and continue cycle.  . dorzolamide (TRUSOPT) 2 % ophthalmic solution Apply to eye.  . DUREZOL 0.05 % EMUL PLACE 1 DROP b/l bid  . ferrous sulfate 325 (65 FE) MG tablet Take 325 mg by mouth daily with breakfast.  . fluticasone (FLONASE) 50 MCG/ACT nasal spray Place 2 sprays into both nostrils daily.  . fluticasone furoate-vilanterol (BREO ELLIPTA) 100-25 MCG/INH AEPB TAKE 1 PUFF BY MOUTH EVERY DAY rinse mouth  . hydrocortisone 2.5 % cream Apply topically 2 (two) times daily. Face and neck  . meloxicam (MOBIC) 15 MG tablet meloxicam 15 mg tablet  TAKE 1 TABLET(S)daily prn DO NOT TAKE WITH IBUPROFEN  . metroNIDAZOLE (FLAGYL) 500 MG tablet  Take 1 tablet (500 mg total) by mouth 2 (two) times daily. With food  . Multiple Vitamin (MULTIVITAMIN) tablet Take 1 tablet by mouth daily.  . predniSONE (DELTASONE) 20 MG tablet 2 tabs po daily for 5 days, then 1 tab po daily for 5 days  . triamcinolone (KENALOG) 0.1 % Apply 1 application topically 2 (two) times daily. As needed  . TURMERIC PO Take by mouth.  . vitamin B-12 (CYANOCOBALAMIN) 1000 MCG tablet Take 1 tablet (1,000 mcg total) by mouth daily.  . [DISCONTINUED] norethindrone (MICRONOR,CAMILA,ERRIN) 0.35 MG tablet Take 1 tablet by mouth daily.   No facility-administered encounter medications on file as of 10/10/2020.

## 2020-10-10 NOTE — Patient Instructions (Signed)
Voltaren 1% gel. Over the counter You can apply up to 4 times a day Minimal is absorbed in the bloodstream Cost is about 9 dollars  Lidocaine 4% cream Can use as much as you want.

## 2020-11-08 DIAGNOSIS — H11133 Conjunctival pigmentations, bilateral: Secondary | ICD-10-CM | POA: Diagnosis not present

## 2020-11-08 DIAGNOSIS — Z961 Presence of intraocular lens: Secondary | ICD-10-CM | POA: Diagnosis not present

## 2020-11-08 DIAGNOSIS — H02402 Unspecified ptosis of left eyelid: Secondary | ICD-10-CM | POA: Diagnosis not present

## 2020-11-08 DIAGNOSIS — H4043X1 Glaucoma secondary to eye inflammation, bilateral, mild stage: Secondary | ICD-10-CM | POA: Diagnosis not present

## 2020-11-30 ENCOUNTER — Telehealth: Payer: Self-pay | Admitting: Internal Medicine

## 2020-11-30 NOTE — Telephone Encounter (Signed)
Does she nee appt ? [facility] if needed or [facility] if worsening  Write letter to be out of work 10-14 days quarantine   There is no medication other than over the counter meds:  Mucinex dm green label for cough.  Vitamin C 1000 mg daily.  Vitamin D3 4000 Iu (units) daily.  Zinc 100 mg daily.  Quercetin 250-500 mg 2 times per day   Elderberry  Oil of oregano  cepacol or chloroseptic spray  Warm tea with honey and lemon  Hydration  Try to eat though you dont feel like it   Tylenol or Advil  Nasal saline and Flonase for nasal congestion  Over the counter allergy pill zyrtec, allegra, xyzal, claritin sneezing, runny nose   Monitor pulse oximeter, buy from [place] if oxygen is less than 90 please go to the hospital.        Are you feeling really sick? Shortness of breath, cough, chest pain?, dizziness? Confusion   If so let me know  If worsening, go to hospital or [facility] clinic Urgent care for further treatment

## 2020-11-30 NOTE — Telephone Encounter (Signed)
Patient called in has tested positive for covid wanted to know what she should do and how long do she need to stay out of work also do she need a excuse to go back to work

## 2020-11-30 NOTE — Telephone Encounter (Signed)
Patient informed and verbalized understanding.  Patient testes positive yesterday but states today is day 5 of symptoms. Patient having sore throat and headache. States she will need a note for work as she was supposed to go in on Monday and thought quarantine was only 5 days.   Please advise on note for work.

## 2020-11-30 NOTE — Telephone Encounter (Signed)
Please advise 

## 2020-12-01 ENCOUNTER — Ambulatory Visit
Admission: RE | Admit: 2020-12-01 | Discharge: 2020-12-01 | Disposition: A | Discharge status: Home / Self Care | Payer: Federal, State, Local not specified - PPO | Source: Ambulatory Visit | Attending: Physician Assistant | Admitting: Physician Assistant

## 2020-12-01 ENCOUNTER — Other Ambulatory Visit: Payer: Self-pay

## 2020-12-01 VITALS — BP 107/62 | HR 111 | Temp 99.3°F | Resp 18 | Ht 66.0 in | Wt 238.0 lb

## 2020-12-01 DIAGNOSIS — U071 COVID-19: Secondary | ICD-10-CM | POA: Diagnosis not present

## 2020-12-01 LAB — COMPREHENSIVE METABOLIC PANEL
ALT: 25 U/L (ref 0–44)
AST: 28 U/L (ref 15–41)
Albumin: 4.2 g/dL (ref 3.5–5.0)
Alkaline Phosphatase: 67 U/L (ref 38–126)
Anion gap: 7 (ref 5–15)
BUN: 13 mg/dL (ref 6–20)
CO2: 25 mmol/L (ref 22–32)
Calcium: 9 mg/dL (ref 8.9–10.3)
Chloride: 107 mmol/L (ref 98–111)
Creatinine, Ser: 0.7 mg/dL (ref 0.44–1.00)
GFR, Estimated: >60 mL/min (ref >60)
Glucose, Bld: 123 mg/dL — ABNORMAL HIGH (ref 70–99)
Potassium: 3.6 mmol/L (ref 3.5–5.1)
Sodium: 139 mmol/L (ref 135–145)
Total Bilirubin: 0.5 mg/dL (ref 0.3–1.2)
Total Protein: 8.5 g/dL — ABNORMAL HIGH (ref 6.5–8.1)

## 2020-12-01 MED ORDER — NIRMATRELVIR/RITONAVIR (PAXLOVID)TABLET: ORAL_TABLET | ORAL | 0 refills | Status: DC

## 2020-12-01 MED ORDER — ONDANSETRON HCL 4 MG PO TABS: 4.0000 mg | ORAL_TABLET | Freq: Three times a day (TID) | ORAL | 0 refills | Status: DC | PRN

## 2020-12-01 MED ORDER — ONDANSETRON 8 MG PO TBDP
8.0000 mg | ORAL_TABLET | Freq: Once | ORAL | Status: AC
  Administered 2020-12-01: 8 mg via ORAL

## 2020-12-01 NOTE — ED Provider Notes (Signed)
[facility]    CSN: [id number] Arrival date & time: 12/01/20  1438      History   Chief Complaint Chief Complaint  Patient presents with  . Appointment  . Covid Positive    HPI Hannah Robinson is a 55 y.o. female.   Hannah Robinson presents with complaints of sore throat, fatigue, weakness, headache, nausea, and occasional cough. Started three days ago. Yesterday had a home covid test which was positive. No shortness of breath . No vomiting or diarrhea. No work of breathing. No chest pain . Her son had been ill a few days prior to onset of her symptoms. Has not had covid in the past 3 months.     ROS per HPI, negative if not otherwise mentioned.      Past Medical History:  Diagnosis Date  . Anemia    iron infusions in past  . Arthritis    right knee (also torn meniscus)  . Childhood asthma   . Eczema   . Iritis    Followed by Dr. Marti Sleigh  . Motion sickness    back seat cars  . Prediabetes   . Radius fracture    right 09/21/19 [facility] conservative tx Dr. Stephenie Acres  . Right wrist fracture   . Tear of meniscus of right knee   . Urinary tract bacterial infections     Patient Active Problem List   Diagnosis Date Noted  . Prediabetes 06/27/2020  . Morbid obesity with BMI of 40.0-44.9, adult (HCC) 06/27/2020  . B12 deficiency 10/17/2019  . Positive ANA (antinuclear antibody) 10/17/2019  . Neutropenia (HCC) 10/17/2019  . Thrombophlebitis of superficial veins of right lower extremity 10/17/2019  . Kidney stone 05/06/2019  . Hiatal hernia 05/06/2019  . Arthritis of back 05/06/2019  . Gallstones 05/06/2019  . Iron deficiency 05/04/2019  . Bacterial vaginitis 05/04/2019  . Annual physical exam 02/05/2018  . Intertrigo 02/05/2018  . Obesity, Class III, BMI 40-49.9 (morbid obesity) (HCC) 02/05/2018  . Leukopenia 12/27/2017  . Special screening for malignant neoplasms, colon   . High risk medication use 07/29/2016  . Acute bronchitis 06/18/2016   . Skin lesion 12/07/2015  . Right knee pain 10/12/2015  . BMI 30.0-30.9,adult 12/04/2014  . Candidal skin infection 12/04/2014  . Candidiasis of skin and nail 12/04/2014  . Recurrent UTI 10/13/2013  . Routine general medical examination at a health care facility 09/02/2013  . Dyshidrotic eczema 09/02/2013  . Eczema 07/26/2013  . Dysuria 08/09/2012  . Vitamin D deficiency 08/09/2012  . Iron deficiency anemia 03/01/2012  . Screening for skin cancer 03/01/2012  . Iritis 03/01/2012  . Asthma 08/25/2011  . Rhinitis, allergic 08/25/2011  . Reflux 08/25/2011  . Gastro-esophageal reflux disease without esophagitis 08/25/2011    Past Surgical History:  Procedure Laterality Date  . BREAST BIOPSY  1988  . CATARACT EXTRACTION W/ INTRAOCULAR LENS IMPLANT Left    b/l eye surgery for cataracts b/l right eye blurry after surgery   . COLONOSCOPY WITH PROPOFOL N/A 03/05/2017   Procedure: COLONOSCOPY WITH PROPOFOL;  Surgeon: Midge Minium, MD;  Location: [facility];  Service: Endoscopy;  Laterality: N/A;  . DILATION AND CURETTAGE OF UTERUS  1979  . VAGINAL DELIVERY     2    OB History   No obstetric history on file.      Home Medications    Prior to Admission medications   Medication Sig Start Date End Date Taking? Authorizing Provider  acetaminophen (TYLENOL) 500 MG  tablet Take 2 tablets (1,000 mg total) by mouth every 6 (six) hours as needed. 09/10/19  Yes Evon SlackGaines, Thomas C, PA-C  albuterol (PROAIR HFA) 108 (90 Base) MCG/ACT inhaler Inhale 2 puffs into the lungs every 4 hours as needed 06/27/20  Yes McLean-Scocuzza, Pasty Spillersracy N, MD  aspirin 81 MG EC tablet Take by mouth.   Yes [provider]  brimonidine (ALPHAGAN) 0.2 % ophthalmic solution Apply to eye. 05/16/20 05/16/21 Yes [provider]  dorzolamide (TRUSOPT) 2 % ophthalmic solution Apply to eye. 05/16/20 05/16/21 Yes [provider]  DUREZOL 0.05 % EMUL PLACE 1 DROP b/l bid 10/31/16  Yes [provider]  fluticasone (FLONASE) 50 MCG/ACT nasal spray Place 2 sprays into both nostrils daily. 06/27/20  Yes McLean-Scocuzza, Pasty Spillersracy N, MD  meloxicam (MOBIC) 15 MG tablet meloxicam 15 mg tablet  TAKE 1 TABLET(S)daily prn DO NOT TAKE WITH IBUPROFEN 06/27/20  Yes McLean-Scocuzza, Pasty Spillersracy N, MD  Multiple Vitamin (MULTIVITAMIN) tablet Take 1 tablet by mouth daily.   Yes [provider]  nirmatrelvir/ritonavir EUA (PAXLOVID) TABS Take nirmatrelvir (150 mg) 2 tablet(s) twice daily for 5 days and ritonavir (100 mg) one tablet twice daily for 5 days. 12/01/20  Yes Naveen Clardy, Dorene GrebeNatalie B, NP  ondansetron (ZOFRAN) 4 MG tablet Take 1 tablet (4 mg total) by mouth every 8 (eight) hours as needed for nausea or vomiting. 12/01/20  Yes Ilai Hiller B, NP  triamcinolone (KENALOG) 0.1 % Apply 1 application topically 2 (two) times daily. As needed 06/27/20  Yes McLean-Scocuzza, Pasty Spillersracy N, MD  TURMERIC PO Take by mouth.   Yes [provider]  vitamin B-12 (CYANOCOBALAMIN) 1000 MCG tablet Take 1 tablet (1,000 mcg total) by mouth daily. 06/07/19  Yes Rickard PatienceYu, Zhou, MD  VITAMIN D PO Take 1 tablet by mouth daily.   Yes [provider]  cetirizine (ZYRTEC) 10 MG tablet Take 1 tablet (10 mg total) by mouth daily as needed. 06/27/20   McLean-Scocuzza, Pasty Spillersracy N, MD  ciclopirox (PENLAC) 8 % solution Apply topically at bedtime. Apply over nail and surrounding skin. Apply daily over previous coat. After seven (7) days, may remove with alcohol and continue cycle. 09/19/20   McLean-Scocuzza, Pasty Spillersracy N, MD  ferrous sulfate 325 (65 FE) MG tablet Take 325 mg by mouth daily with breakfast.    [provider]  fluticasone furoate-vilanterol (BREO ELLIPTA) 100-25 MCG/INH AEPB TAKE 1 PUFF BY MOUTH EVERY DAY rinse mouth 06/27/20   McLean-Scocuzza, Pasty Spillersracy N, MD  hydrocortisone 2.5 % cream Apply topically 2 (two) times daily. Face and neck 04/07/19   McLean-Scocuzza, Pasty Spillersracy N, MD  metroNIDAZOLE (FLAGYL) 500 MG tablet Take 1  tablet (500 mg total) by mouth 2 (two) times daily. With food 09/19/20   McLean-Scocuzza, Pasty Spillersracy N, MD  predniSONE (DELTASONE) 20 MG tablet 2 tabs po daily for 5 days, then 1 tab po daily for 5 days 10/10/20   Copland, Karleen HampshireSpencer, MD  norethindrone (MICRONOR,CAMILA,ERRIN) 0.35 MG tablet Take 1 tablet by mouth daily.  10/08/11  [provider]    Family History Family History  Problem Relation Age of Onset  . Alcohol abuse Maternal Aunt   . Clotting disorder Mother   . Arthritis Mother   . Heart disease Mother   . Bipolar disorder Brother   . Stroke Maternal Grandmother   . Cancer Maternal Aunt        lung  . Asthma Son     Social History Social History   Tobacco Use  . Smoking status:  Never Smoker  . Smokeless tobacco: Never Used  Vaping Use  . Vaping Use: Never used  Substance Use Topics  . Alcohol use: Yes    Alcohol/week: 3.0 standard drinks    Types: 3 Glasses of wine per week    Comment: Wine 3 reg pour/week  . Drug use: No     Allergies   Other, Penicillins, Tree extract, and Amoxicillin   Review of Systems Review of Systems   Physical Exam Triage Vital Signs ED Triage Vitals  Enc Vitals Group     BP 12/01/20 1505 107/62     Pulse Rate 12/01/20 1505 (!) 111     Resp 12/01/20 1505 18     Temp 12/01/20 1505 99.3 F (37.4 C)     Temp Source 12/01/20 1505 Oral     SpO2 12/01/20 1505 98 %     Weight 12/01/20 1504 238 lb (108 kg)     Height 12/01/20 1504 5\' 6"  (1.676 m)     Head Circumference --      Peak Flow --      Pain Score 12/01/20 1504 0     Pain Loc --      Pain Edu? --      Excl. in GC? --    No data found.  Updated Vital Signs BP 107/62 (BP Location: Left Arm)   Pulse (!) 111   Temp 99.3 F (37.4 C) (Oral)   Resp 18   Ht 5\' 6"  (1.676 m)   Wt 238 lb (108 kg)   SpO2 98%   BMI 38.41 kg/m   Visual Acuity Right Eye Distance:   Left Eye Distance:   Bilateral Distance:    Right Eye Near:   Left Eye Near:    Bilateral Near:      Physical Exam Constitutional:      General: She is not in acute distress.    Appearance: She is well-developed.  Cardiovascular:     Rate and Rhythm: Tachycardia present.  Pulmonary:     Effort: Pulmonary effort is normal.  Skin:    General: Skin is warm and dry.  Neurological:     Mental Status: She is alert and oriented to person, place, and time.      UC Treatments / Results  Labs (all labs ordered are listed, but only abnormal results are displayed) Labs Reviewed  SARS CORONAVIRUS 2 (TAT 6-24 HRS)  COMPREHENSIVE METABOLIC PANEL    EKG   Radiology No results found.  Procedures Procedures (including critical care time)  Medications Ordered in UC Medications  ondansetron (ZOFRAN-ODT) disintegrating tablet 8 mg (8 mg Oral Given 12/01/20 1533)    Initial Impression / Assessment and Plan / UC Course  I have reviewed the triage vital signs and the nursing notes.  Pertinent labs & imaging results that were available during my care of the patient were reviewed by me and considered in my medical decision making (see chart for details).     Non toxic. Benign physical exam.  Tachycardia noted. Positive home covid test, pcr testing for her work. History of asthma, and patient's BMI meet criteria for paxlovid. Patient education discussed and provided about this, she will consider taking after reading the additional patient information provided to her. Baseline CMP collected- GFR WNL. Supportive cares otherwise recommended. Return precautions provided. Patient verbalized understanding and agreeable to plan.   Final Clinical Impressions(s) / UC Diagnoses   Final diagnoses:  COVID-19     Discharge Instructions  See provided information about covid-19 and paxlovid.  Zofran every 8 hours as needed for nausea or vomiting.   Please return for any worsening of symptoms.    ED Prescriptions    Medication Sig Dispense Auth. Provider   ondansetron (ZOFRAN) 4 MG tablet  Take 1 tablet (4 mg total) by mouth every 8 (eight) hours as needed for nausea or vomiting. 10 tablet Linus Mako B, NP   nirmatrelvir/ritonavir EUA (PAXLOVID) TABS Take nirmatrelvir (150 mg) 2 tablet(s) twice daily for 5 days and ritonavir (100 mg) one tablet twice daily for 5 days. 30 tablet Georgetta Haber, NP     PDMP not reviewed this encounter.   Georgetta Haber, NP 12/01/20 1601

## 2020-12-01 NOTE — ED Triage Notes (Signed)
Patient in today c/o fatigue, cough, sore throat and headache x 3 days. Patient is starting to get nauseous. Patient did a home covid test on Thursday (11/29/20) and it was positive. Patient has taken OTC Tylenol for her symptoms. Patient states she needs a PCR covid test for her employer.

## 2020-12-01 NOTE — Discharge Instructions (Addendum)
See provided information about covid-19 and paxlovid.  Zofran every 8 hours as needed for nausea or vomiting.   Please return for any worsening of symptoms.

## 2020-12-02 LAB — SARS CORONAVIRUS 2 (TAT 6-24 HRS): SARS Coronavirus 2: POSITIVE — AB

## 2020-12-03 ENCOUNTER — Encounter: Payer: Self-pay | Admitting: Internal Medicine

## 2020-12-03 NOTE — Telephone Encounter (Signed)
Patient informed via my chart.

## 2020-12-03 NOTE — Telephone Encounter (Signed)
Note in my chart  Inform pt thanks

## 2020-12-11 ENCOUNTER — Telehealth: Payer: Self-pay | Admitting: Oncology

## 2020-12-11 NOTE — Telephone Encounter (Signed)
error 

## 2020-12-12 ENCOUNTER — Other Ambulatory Visit: Payer: Federal, State, Local not specified - PPO

## 2020-12-13 ENCOUNTER — Ambulatory Visit: Payer: Federal, State, Local not specified - PPO | Admitting: Oncology

## 2020-12-14 ENCOUNTER — Ambulatory Visit: Payer: Federal, State, Local not specified - PPO | Admitting: Oncology

## 2021-01-22 ENCOUNTER — Other Ambulatory Visit: Payer: Self-pay

## 2021-01-22 DIAGNOSIS — I8001 Phlebitis and thrombophlebitis of superficial vessels of right lower extremity: Secondary | ICD-10-CM

## 2021-01-23 ENCOUNTER — Other Ambulatory Visit
Admission: RE | Admit: 2021-01-23 | Discharge: 2021-01-23 | Disposition: A | Discharge status: Home / Self Care | Payer: Federal, State, Local not specified - PPO | Attending: Oncology | Admitting: Oncology

## 2021-01-23 ENCOUNTER — Other Ambulatory Visit: Payer: Self-pay

## 2021-01-23 ENCOUNTER — Other Ambulatory Visit: Payer: Federal, State, Local not specified - PPO

## 2021-01-23 DIAGNOSIS — I8001 Phlebitis and thrombophlebitis of superficial vessels of right lower extremity: Secondary | ICD-10-CM | POA: Insufficient documentation

## 2021-01-23 LAB — CBC WITH DIFFERENTIAL/PLATELET
Abs Immature Granulocytes: 0 10*3/uL (ref 0.00–0.07)
Basophils Absolute: 0 10*3/uL (ref 0.0–0.1)
Basophils Relative: 1 %
Eosinophils Absolute: 0.1 10*3/uL (ref 0.0–0.5)
Eosinophils Relative: 3 %
HCT: 34 % — ABNORMAL LOW (ref 36.0–46.0)
Hemoglobin: 11.3 g/dL — ABNORMAL LOW (ref 12.0–15.0)
Immature Granulocytes: 0 %
Lymphocytes Relative: 48 %
Lymphs Abs: 1.5 10*3/uL (ref 0.7–4.0)
MCH: 26.3 pg (ref 26.0–34.0)
MCHC: 33.2 g/dL (ref 30.0–36.0)
MCV: 79.1 fL — ABNORMAL LOW (ref 80.0–100.0)
Monocytes Absolute: 0.3 10*3/uL (ref 0.1–1.0)
Monocytes Relative: 9 %
Neutro Abs: 1.2 10*3/uL — ABNORMAL LOW (ref 1.7–7.7)
Neutrophils Relative %: 39 %
Platelets: 240 10*3/uL (ref 150–400)
RBC: 4.3 MIL/uL (ref 3.87–5.11)
RDW: 15.7 % — ABNORMAL HIGH (ref 11.5–15.5)
WBC: 3.1 10*3/uL — ABNORMAL LOW (ref 4.0–10.5)
nRBC: 0 % (ref 0.0–0.2)

## 2021-01-23 LAB — COMPREHENSIVE METABOLIC PANEL
ALT: 15 U/L (ref 0–44)
AST: 20 U/L (ref 15–41)
Albumin: 4 g/dL (ref 3.5–5.0)
Alkaline Phosphatase: 67 U/L (ref 38–126)
Anion gap: 8 (ref 5–15)
BUN: 15 mg/dL (ref 6–20)
CO2: 25 mmol/L (ref 22–32)
Calcium: 8.9 mg/dL (ref 8.9–10.3)
Chloride: 105 mmol/L (ref 98–111)
Creatinine, Ser: 0.57 mg/dL (ref 0.44–1.00)
GFR, Estimated: >60 mL/min (ref >60)
Glucose, Bld: 95 mg/dL (ref 70–99)
Potassium: 3.9 mmol/L (ref 3.5–5.1)
Sodium: 138 mmol/L (ref 135–145)
Total Bilirubin: 0.9 mg/dL (ref 0.3–1.2)
Total Protein: 7.8 g/dL (ref 6.5–8.1)

## 2021-01-29 DIAGNOSIS — L209 Atopic dermatitis, unspecified: Secondary | ICD-10-CM | POA: Diagnosis not present

## 2021-01-29 DIAGNOSIS — R21 Rash and other nonspecific skin eruption: Secondary | ICD-10-CM | POA: Diagnosis not present

## 2021-01-31 ENCOUNTER — Ambulatory Visit: Payer: Federal, State, Local not specified - PPO | Admitting: Oncology

## 2021-01-31 ENCOUNTER — Inpatient Hospital Stay: Payer: Federal, State, Local not specified - PPO | Attending: Oncology | Admitting: Oncology

## 2021-01-31 ENCOUNTER — Encounter: Payer: Self-pay | Admitting: Oncology

## 2021-01-31 ENCOUNTER — Other Ambulatory Visit: Payer: Self-pay

## 2021-01-31 DIAGNOSIS — M2550 Pain in unspecified joint: Secondary | ICD-10-CM

## 2021-01-31 DIAGNOSIS — I8001 Phlebitis and thrombophlebitis of superficial vessels of right lower extremity: Secondary | ICD-10-CM | POA: Diagnosis not present

## 2021-01-31 DIAGNOSIS — E538 Deficiency of other specified B group vitamins: Secondary | ICD-10-CM | POA: Diagnosis not present

## 2021-01-31 DIAGNOSIS — D709 Neutropenia, unspecified: Secondary | ICD-10-CM

## 2021-01-31 DIAGNOSIS — R768 Other specified abnormal immunological findings in serum: Secondary | ICD-10-CM

## 2021-01-31 DIAGNOSIS — R7689 Other specified abnormal immunological findings in serum: Secondary | ICD-10-CM

## 2021-01-31 NOTE — Progress Notes (Signed)
HEMATOLOGY-ONCOLOGY TeleHEALTH VISIT PROGRESS NOTE  I connected with Hannah Robinson on 01/31/21  at  3:45 PM EDT by video enabled telemedicine visit and verified that I am speaking with the correct person using two identifiers. I discussed the limitations, risks, security and privacy concerns of performing an evaluation and management service by telemedicine and the availability of in-person appointments. The patient expressed understanding and agreed to proceed.   Other persons participating in the visit and their role in the encounter:  None  Patient's location: Home  Provider's location: office Chief Complaint: leukopenia, thrombophlebitis, B12 deficiency, positive ANA   INTERVAL HISTORY Hannah Robinson is a 55 y.o. female who has above history reviewed by me today presents for follow up visit for leukopenia, thrombophlebitis, B12 deficiency, positive ANA She follows up with vascular surgeon and her thrombophlebitis symptom have completely resolved. No fever chills, unintentional weight loss.  Continues to have multiple arthralgia.    Review of Systems  Constitutional:  Negative for appetite change, chills, fatigue and fever.  HENT:   Negative for hearing loss and voice change.   Eyes:  Negative for eye problems.  Respiratory:  Negative for chest tightness and cough.   Cardiovascular:  Negative for chest pain.  Gastrointestinal:  Negative for abdominal distention, abdominal pain and blood in stool.  Endocrine: Negative for hot flashes.  Genitourinary:  Negative for difficulty urinating and frequency.   Musculoskeletal:  Positive for arthralgias.  Skin:  Negative for itching and rash.  Neurological:  Negative for extremity weakness.  Hematological:  Negative for adenopathy.  Psychiatric/Behavioral:  Negative for confusion.    Past Medical History:  Diagnosis Date   Anemia    iron infusions in past   Arthritis    right knee (also torn meniscus)   Childhood asthma     COVID-19    11/30/20   Eczema    Iritis    Followed by Dr. Marti Sleigh   Motion sickness    back seat cars   Prediabetes    Radius fracture    right [facility] Dr. Stephenie Acres   Right wrist fracture    Tear of meniscus of right knee    Urinary tract bacterial infections    Past Surgical History:  Procedure Laterality Date   BREAST BIOPSY  1988   CATARACT EXTRACTION W/ INTRAOCULAR LENS IMPLANT Left    b/l eye surgery for cataracts b/l right eye blurry after surgery    COLONOSCOPY WITH PROPOFOL N/A 03/05/2017   Procedure: COLONOSCOPY WITH PROPOFOL;  Surgeon: Midge Minium, MD;  Location: [facility];  Service: Endoscopy;  Laterality: N/A;   DILATION AND CURETTAGE OF UTERUS  1979   VAGINAL DELIVERY     2    Family History  Problem Relation Age of Onset   Alcohol abuse Maternal Aunt    Clotting disorder Mother    Arthritis Mother    Heart disease Mother    Bipolar disorder Brother    Stroke Maternal Grandmother    Cancer Maternal Aunt        lung   Asthma Son     Social History   Socioeconomic History   Marital status: Single    Spouse name: Not on file   Number of children: 2   Years of education: Not on file   Highest education level: Not on file  Occupational History   Occupation: Mail carrier  Tobacco Use   Smoking status: Never   Smokeless tobacco: Never  Vaping  Use   Vaping Use: Never used  Substance and Sexual Activity   Alcohol use: Yes    Alcohol/week: 3.0 standard drinks    Types: 3 Glasses of wine per week    Comment: Wine 3 reg pour/week   Drug use: No   Sexual activity: Not on file  Other Topics Concern   Not on file  Social History Narrative   Lives in [place] with sons, from [place].      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 30 tablet 0   albuterol (PROAIR HFA) 108 (90 Base) MCG/ACT inhaler Inhale 2 puffs into the lungs every 4 hours as needed 18 g 11   aspirin 81 MG EC tablet Take by mouth.     brimonidine (ALPHAGAN) 0.2 % ophthalmic solution Apply to eye.     cetirizine (ZYRTEC) 10 MG tablet Take 1 tablet (10 mg total) by mouth daily as needed. 90 tablet 3   dorzolamide (TRUSOPT) 2 % ophthalmic solution Apply to eye.     DUREZOL 0.05 % EMUL PLACE 1 DROP b/l bid  6   ferrous sulfate 325 (65 FE) MG tablet Take 325 mg by mouth daily with breakfast.     fluticasone (FLONASE) 50 MCG/ACT nasal spray Place 2 sprays into both nostrils daily. 16 g 8   fluticasone furoate-vilanterol (BREO ELLIPTA) 100-25 MCG/INH AEPB TAKE 1 PUFF BY MOUTH EVERY DAY rinse mouth 60 each 12   hydrocortisone 2.5 % cream Apply topically 2 (two) times daily. Face and neck 60 g 11   meloxicam (MOBIC) 15 MG tablet meloxicam 15 mg tablet  TAKE 1 TABLET(S)daily prn DO NOT TAKE WITH IBUPROFEN 90 tablet 1   Multiple Vitamin (MULTIVITAMIN) tablet Take 1 tablet by mouth daily.     triamcinolone (KENALOG) 0.1 % Apply 1 application topically 2 (two) times daily. As needed 454 g 3   TURMERIC PO Take by mouth.     vitamin B-12 (CYANOCOBALAMIN) 1000 MCG tablet Take 1 tablet (1,000 mcg total) by mouth daily. 90 tablet 3   VITAMIN D PO Take 1 tablet by mouth daily.     ciclopirox (PENLAC) 8 % solution Apply topically at bedtime. Apply over nail and surrounding skin. Apply daily over previous coat. After seven (7) days, may remove with alcohol and continue cycle. (Patient not taking: Reported on  01/31/2021) 6.6 mL 11   metroNIDAZOLE (FLAGYL) 500 MG tablet Take 1 tablet (500 mg total) by mouth 2 (two) times daily. With food (Patient not taking: Reported on 01/31/2021) 14 tablet 0   nirmatrelvir/ritonavir EUA (PAXLOVID) TABS Take nirmatrelvir (150 mg) 2 tablet(s) twice daily for 5 days and ritonavir (100 mg) one tablet twice daily for 5 days. (Patient not taking: Reported on 01/31/2021) 30 tablet 0   ondansetron (ZOFRAN) 4 MG tablet Take 1 tablet (4 mg  total) by mouth every 8 (eight) hours as needed for nausea or vomiting. (Patient not taking: Reported on 01/31/2021) 10 tablet 0   predniSONE (DELTASONE) 20 MG tablet 2 tabs po daily for 5 days, then 1 tab po daily for 5 days (Patient not taking: Reported on 01/31/2021) 15 tablet 0   [DISCONTINUED] norethindrone (MICRONOR,CAMILA,ERRIN) 0.35 MG tablet Take 1 tablet by mouth daily.     No current facility-administered medications on file prior to visit.    Allergies  Allergen Reactions   Other Hives    Allergic to tree nuts, but not to peanuts   Penicillins Hives   Tree Extract Hives   Amoxicillin Rash       Observations/Objective: Today's Vitals   01/31/21 1355  PainSc: 0-No pain   There is no height or weight on file to calculate BMI.  Physical Exam Neurological:     Mental Status: She is alert.    CBC    Component Value Date/Time   WBC 3.1 (L) 01/23/2021 0926   RBC 4.30 01/23/2021 0926   HGB 11.3 (L) 01/23/2021 0926   HGB 12.2 08/22/2011 1329   HCT 34.0 (L) 01/23/2021 0926   HCT 36.6 08/22/2011 1329   PLT 240 01/23/2021 0926   PLT 237 08/22/2011 1329   MCV 79.1 (L) 01/23/2021 0926   MCV 80 08/22/2011 1329   MCH 26.3 01/23/2021 0926   MCHC 33.2 01/23/2021 0926   RDW 15.7 (H) 01/23/2021 0926   RDW 15.8 (H) 08/22/2011 1329   LYMPHSABS 1.5 01/23/2021 0926   LYMPHSABS 1.5 08/22/2011 1329   MONOABS 0.3 01/23/2021 0926   MONOABS 0.2 08/22/2011 1329   EOSABS 0.1 01/23/2021 0926   EOSABS 0.4 08/22/2011 1329   BASOSABS 0.0  01/23/2021 0926   BASOSABS 0.0 08/22/2011 1329    CMP     Component Value Date/Time   NA 138 01/23/2021 0926   K 3.9 01/23/2021 0926   CL 105 01/23/2021 0926   CO2 25 01/23/2021 0926   GLUCOSE 95 01/23/2021 0926   GLUCOSE 102 (H) 08/22/2011 1409   BUN 15 01/23/2021 0926   CREATININE 0.57 01/23/2021 0926   CALCIUM 8.9 01/23/2021 0926   PROT 7.8 01/23/2021 0926   ALBUMIN 4.0 01/23/2021 0926   AST 20 01/23/2021 0926   ALT 15 01/23/2021 0926   ALKPHOS 67 01/23/2021 0926   BILITOT 0.9 01/23/2021 0926   GFRNONAA >60 01/23/2021 0926   GFRAA >60 12/08/2019 1332     Assessment and Plan: 1. Neutropenia, unspecified type (HCC)   2. Thrombophlebitis of superficial veins of right lower extremity   3. Positive ANA (antinuclear antibody)   4. B12 deficiency   5. Arthralgia, unspecified joint     # Leukopenia, chronic neutropenia.  Previously worked up.  Negative flowcytometry, positive ANA Probably ethnic neutropenia.  # multiple arthralgia, with positive ANA, dsDNA negative. She was recommended to further discuss with PCP. Patient prefers to have evaluation by rheumatology. Refer to rheumatology for further evaluation.   #low B12 level, continue oral vitamin B12 supplementation.  # Thrombophlebitis, resolved. Follow up with vascular surgery  Follow Up Instructions:  Patient will be discharged. Advise her to follow up with pcp  I discussed the assessment and treatment plan with the patient. The patient was provided an opportunity to ask questions and all were answered. The patient agreed with the plan and demonstrated an understanding of the instructions.  The patient was advised to call back or seek an in-person evaluation if the symptoms  worsen or if the condition fails to improve as anticipated.   Rickard PatienceZhou Marshel Golubski, MD 01/31/2021 10:43 PM

## 2021-01-31 NOTE — Progress Notes (Signed)
Patient contacted for Mychart visit. Pt reports having arthritis pain and would like referral to rheumatology if appropriate.

## 2021-03-14 DIAGNOSIS — H21543 Posterior synechiae (iris), bilateral: Secondary | ICD-10-CM | POA: Diagnosis not present

## 2021-03-14 DIAGNOSIS — H2013 Chronic iridocyclitis, bilateral: Secondary | ICD-10-CM | POA: Diagnosis not present

## 2021-03-14 DIAGNOSIS — H35373 Puckering of macula, bilateral: Secondary | ICD-10-CM | POA: Diagnosis not present

## 2021-03-14 DIAGNOSIS — H35353 Cystoid macular degeneration, bilateral: Secondary | ICD-10-CM | POA: Diagnosis not present

## 2021-04-10 ENCOUNTER — Ambulatory Visit
Admission: EM | Admit: 2021-04-10 | Discharge: 2021-04-10 | Disposition: A | Discharge status: Home / Self Care | Payer: Federal, State, Local not specified - PPO | Attending: Family Medicine | Admitting: Family Medicine

## 2021-04-10 ENCOUNTER — Other Ambulatory Visit: Payer: Self-pay

## 2021-04-10 DIAGNOSIS — N3001 Acute cystitis with hematuria: Secondary | ICD-10-CM | POA: Diagnosis not present

## 2021-04-10 LAB — POCT URINALYSIS DIP (DEVICE)
Bilirubin Urine: NEGATIVE
Glucose, UA: NEGATIVE mg/dL
Ketones, ur: NEGATIVE mg/dL
Nitrite: NEGATIVE
Protein, ur: 100 mg/dL — AB
Specific Gravity, Urine: >=1.03 (ref 1.005–1.030)
Urobilinogen, UA: 0.2 mg/dL (ref 0.0–1.0)
pH: 6.5 (ref 5.0–8.0)

## 2021-04-10 MED ORDER — NITROFURANTOIN MONOHYD MACRO 100 MG PO CAPS
100.0000 mg | ORAL_CAPSULE | Freq: Two times a day (BID) | ORAL | 0 refills | Status: DC
Start: 2021-04-10 — End: 2021-07-02

## 2021-04-10 NOTE — ED Provider Notes (Signed)
[facility]    CSN: [id number] Arrival date & time: 04/10/21  1902      History   Chief Complaint Chief Complaint  Patient presents with   Dysuria    HPI 55 year old female presents with dysuria.  Patient reports that she has had symptoms since late last week.  She states that she has dysuria at the end of her urinary stream.  She has a history of recurrent UTI.  Symptoms have been getting worse. No abdominal pain, back pain.  No relieving factors.  No other complaints.  Past Medical History:  Diagnosis Date   Anemia    iron infusions in past   Arthritis    right knee (also torn meniscus)   Childhood asthma    COVID-19    11/30/20   Eczema    Iritis    Followed by Dr. Marti Sleigh   Motion sickness    back seat cars   Prediabetes    Radius fracture    right [facility] Dr. Stephenie Acres   Right wrist fracture    Tear of meniscus of right knee    Urinary tract bacterial infections     Patient Active Problem List   Diagnosis Date Noted   Prediabetes 06/27/2020   Morbid obesity with BMI of 40.0-44.9, adult (HCC) 06/27/2020   B12 deficiency 10/17/2019   Positive ANA (antinuclear antibody) 10/17/2019   Neutropenia (HCC) 10/17/2019   Thrombophlebitis of superficial veins of right lower extremity 10/17/2019   Kidney stone 05/06/2019   Hiatal hernia 05/06/2019   Arthritis of back 05/06/2019   Gallstones 05/06/2019   Iron deficiency 05/04/2019   Bacterial vaginitis 05/04/2019   Annual physical exam 02/05/2018   Intertrigo 02/05/2018   Obesity, Class III, BMI 40-49.9 (morbid obesity) (HCC) 02/05/2018   Leukopenia 12/27/2017   Special screening for malignant neoplasms, colon    High risk medication use 07/29/2016   Acute bronchitis 06/18/2016   Skin lesion 12/07/2015   Right knee pain 10/12/2015   BMI 30.0-30.9,adult 12/04/2014   Candidal skin infection 12/04/2014   Candidiasis of skin and nail 12/04/2014   Recurrent UTI 10/13/2013    Routine general medical examination at a health care facility 09/02/2013   Dyshidrotic eczema 09/02/2013   Eczema 07/26/2013   Dysuria 08/09/2012   Vitamin D deficiency 08/09/2012   Iron deficiency anemia 03/01/2012   Screening for skin cancer 03/01/2012   Iritis 03/01/2012   Asthma 08/25/2011   Rhinitis, allergic 08/25/2011   Reflux 08/25/2011   Gastro-esophageal reflux disease without esophagitis 08/25/2011    Past Surgical History:  Procedure Laterality Date   BREAST BIOPSY  1988   CATARACT EXTRACTION W/ INTRAOCULAR LENS IMPLANT Left    b/l eye surgery for cataracts b/l right eye blurry after surgery    COLONOSCOPY WITH PROPOFOL N/A 03/05/2017   Procedure: COLONOSCOPY WITH PROPOFOL;  Surgeon: Midge Minium, MD;  Location: [facility];  Service: Endoscopy;  Laterality: N/A;   DILATION AND CURETTAGE OF UTERUS  1979   VAGINAL DELIVERY     2    OB History   No obstetric history on file.      Home Medications    Prior to Admission medications   Medication Sig Start Date End Date Taking? Authorizing Provider  acetaminophen (TYLENOL) 500 MG tablet Take 2 tablets (1,000 mg total) by mouth every 6 (six) hours as needed. 09/10/19  Yes Evon Slack, PA-C  albuterol (PROAIR HFA) 108 (90 Base) MCG/ACT inhaler Inhale 2 puffs  into the lungs every 4 hours as needed 06/27/20  Yes McLean-Scocuzza, Pasty Spillers, MD  aspirin 81 MG EC tablet Take by mouth.   Yes [provider]  brimonidine (ALPHAGAN) 0.2 % ophthalmic solution Apply to eye. 05/16/20 05/16/21 Yes [provider]  cetirizine (ZYRTEC) 10 MG tablet Take 1 tablet (10 mg total) by mouth daily as needed. 06/27/20  Yes McLean-Scocuzza, Pasty Spillers, MD  ciclopirox (PENLAC) 8 % solution Apply topically at bedtime. Apply over nail and surrounding skin. Apply daily over previous coat. After seven (7) days, may remove with alcohol and continue cycle. 09/19/20  Yes McLean-Scocuzza, Pasty Spillers, MD  dorzolamide (TRUSOPT) 2 %  ophthalmic solution Apply to eye. 05/16/20 05/16/21 Yes [provider]  DUREZOL 0.05 % EMUL PLACE 1 DROP b/l bid 10/31/16  Yes [provider]  ferrous sulfate 325 (65 FE) MG tablet Take 325 mg by mouth daily with breakfast.   Yes [provider]  fluticasone (FLONASE) 50 MCG/ACT nasal spray Place 2 sprays into both nostrils daily. 06/27/20  Yes McLean-Scocuzza, Pasty Spillers, MD  fluticasone furoate-vilanterol (BREO ELLIPTA) 100-25 MCG/INH AEPB TAKE 1 PUFF BY MOUTH EVERY DAY rinse mouth 06/27/20  Yes McLean-Scocuzza, Pasty Spillers, MD  hydrocortisone 2.5 % cream Apply topically 2 (two) times daily. Face and neck 04/07/19  Yes McLean-Scocuzza, Pasty Spillers, MD  meloxicam (MOBIC) 15 MG tablet meloxicam 15 mg tablet  TAKE 1 TABLET(S)daily prn DO NOT TAKE WITH IBUPROFEN 06/27/20  Yes McLean-Scocuzza, Pasty Spillers, MD  Multiple Vitamin (MULTIVITAMIN) tablet Take 1 tablet by mouth daily.   Yes [provider]  nitrofurantoin, macrocrystal-monohydrate, (MACROBID) 100 MG capsule Take 1 capsule (100 mg total) by mouth 2 (two) times daily. 04/10/21  Yes Crystelle Ferrufino G, DO  triamcinolone (KENALOG) 0.1 % Apply 1 application topically 2 (two) times daily. As needed 06/27/20  Yes McLean-Scocuzza, Pasty Spillers, MD  TURMERIC PO Take by mouth.   Yes [provider]  vitamin B-12 (CYANOCOBALAMIN) 1000 MCG tablet Take 1 tablet (1,000 mcg total) by mouth daily. 06/07/19  Yes Rickard Patience, MD  VITAMIN D PO Take 1 tablet by mouth daily.   Yes [provider]  norethindrone (MICRONOR,CAMILA,ERRIN) 0.35 MG tablet Take 1 tablet by mouth daily.  10/08/11  [provider]    Family History Family History  Problem Relation Age of Onset   Alcohol abuse Maternal Aunt    Clotting disorder Mother    Arthritis Mother    Heart disease Mother    Bipolar disorder Brother    Stroke Maternal Grandmother    Cancer Maternal Aunt        lung   Asthma Son     Social History Social History   Tobacco  Use   Smoking status: Never   Smokeless tobacco: Never  Vaping Use   Vaping Use: Never used  Substance Use Topics   Alcohol use: Yes    Alcohol/week: 3.0 standard drinks    Types: 3 Glasses of wine per week    Comment: Wine 3 reg pour/week   Drug use: No     Allergies   Other, Penicillins, Tree extract, and Amoxicillin   Review of Systems Review of Systems Per HPI  Physical Exam Triage Vital Signs ED Triage Vitals  Enc Vitals Group     BP 04/10/21 1918 139/76     Pulse Rate 04/10/21 1918 73     Resp 04/10/21 1918 18     Temp 04/10/21 1918 98.2 F (36.8 C)  Temp Source 04/10/21 1918 Oral     SpO2 04/10/21 1918 100 %     Weight 04/10/21 1916 240 lb (108.9 kg)     Height 04/10/21 1916 5\' 6"  (1.676 m)     Head Circumference --      Peak Flow --      Pain Score 04/10/21 1915 8     Pain Loc --      Pain Edu? --      Excl. in GC? --    Updated Vital Signs BP 139/76 (BP Location: Left Arm)   Pulse 73   Temp 98.2 F (36.8 C) (Oral)   Resp 18   Ht 5\' 6"  (1.676 m)   Wt 108.9 kg   SpO2 100%   BMI 38.74 kg/m   Visual Acuity Right Eye Distance:   Left Eye Distance:   Bilateral Distance:    Right Eye Near:   Left Eye Near:    Bilateral Near:     Physical Exam Constitutional:      Appearance: Normal appearance.  HENT:     Head: Normocephalic and atraumatic.  Eyes:     General:        Right eye: No discharge.        Left eye: No discharge.     Conjunctiva/sclera: Conjunctivae normal.  Cardiovascular:     Rate and Rhythm: Normal rate and regular rhythm.     Heart sounds: No murmur heard. Pulmonary:     Effort: Pulmonary effort is normal.     Breath sounds: Normal breath sounds. No wheezing, rhonchi or rales.  Abdominal:     General: There is no distension.     Palpations: Abdomen is soft.     Tenderness: There is no abdominal tenderness.  Neurological:     Mental Status: She is alert.  Psychiatric:        Mood and Affect: Mood normal.         Behavior: Behavior normal.     UC Treatments / Results  Labs (all labs ordered are listed, but only abnormal results are displayed) Labs Reviewed  POCT URINALYSIS DIP (DEVICE) - Abnormal; Notable for the following components:      Result Value   Hgb urine dipstick LARGE (*)    Protein, ur 100 (*)    Leukocytes,Ua SMALL (*)    All other components within normal limits  URINE CULTURE  POCT URINALYSIS DIPSTICK, ED / UC    EKG   Radiology No results found.  Procedures Procedures (including critical care time)  Medications Ordered in UC Medications - No data to display  Initial Impression / Assessment and Plan / UC Course  I have reviewed the triage vital signs and the nursing notes.  Pertinent labs & imaging results that were available during my care of the patient were reviewed by me and considered in my medical decision making (see chart for details).    55 year old female presents with UTI.  Sending culture.  Placing on Macrobid.  Final Clinical Impressions(s) / UC Diagnoses   Final diagnoses:  Acute cystitis with hematuria   Discharge Instructions   None    ED Prescriptions     Medication Sig Dispense Auth. Provider   nitrofurantoin, macrocrystal-monohydrate, (MACROBID) 100 MG capsule Take 1 capsule (100 mg total) by mouth 2 (two) times daily. 14 capsule G, DO      PDMP not reviewed this encounter.   53, Everlene Other 04/10/21 2022

## 2021-04-10 NOTE — ED Triage Notes (Signed)
Pt c/o urinary burning/pain and some blood when wiping. Pt reports symptoms increasing over past few days. Pt denies f/n/v/d, abd pain or other symptoms.

## 2021-04-12 LAB — URINE CULTURE

## 2021-04-25 DIAGNOSIS — H5704 Mydriasis: Secondary | ICD-10-CM | POA: Diagnosis not present

## 2021-04-25 DIAGNOSIS — Z8669 Personal history of other diseases of the nervous system and sense organs: Secondary | ICD-10-CM | POA: Diagnosis not present

## 2021-04-25 DIAGNOSIS — H4043X1 Glaucoma secondary to eye inflammation, bilateral, mild stage: Secondary | ICD-10-CM | POA: Diagnosis not present

## 2021-04-25 DIAGNOSIS — H11133 Conjunctival pigmentations, bilateral: Secondary | ICD-10-CM | POA: Diagnosis not present

## 2021-06-06 ENCOUNTER — Other Ambulatory Visit: Payer: Self-pay

## 2021-06-06 DIAGNOSIS — E559 Vitamin D deficiency, unspecified: Secondary | ICD-10-CM

## 2021-06-06 DIAGNOSIS — Z1329 Encounter for screening for other suspected endocrine disorder: Secondary | ICD-10-CM

## 2021-06-06 DIAGNOSIS — E538 Deficiency of other specified B group vitamins: Secondary | ICD-10-CM

## 2021-06-06 DIAGNOSIS — Z0000 Encounter for general adult medical examination without abnormal findings: Secondary | ICD-10-CM

## 2021-06-06 DIAGNOSIS — R7303 Prediabetes: Secondary | ICD-10-CM

## 2021-06-06 DIAGNOSIS — Z1389 Encounter for screening for other disorder: Secondary | ICD-10-CM

## 2021-06-24 ENCOUNTER — Other Ambulatory Visit (INDEPENDENT_AMBULATORY_CARE_PROVIDER_SITE_OTHER): Payer: Federal, State, Local not specified - PPO

## 2021-06-24 ENCOUNTER — Other Ambulatory Visit: Payer: Self-pay | Admitting: Internal Medicine

## 2021-06-24 ENCOUNTER — Other Ambulatory Visit: Payer: Self-pay

## 2021-06-24 DIAGNOSIS — E559 Vitamin D deficiency, unspecified: Secondary | ICD-10-CM

## 2021-06-24 DIAGNOSIS — R7303 Prediabetes: Secondary | ICD-10-CM

## 2021-06-24 DIAGNOSIS — Z0000 Encounter for general adult medical examination without abnormal findings: Secondary | ICD-10-CM

## 2021-06-24 DIAGNOSIS — Z1389 Encounter for screening for other disorder: Secondary | ICD-10-CM | POA: Diagnosis not present

## 2021-06-24 DIAGNOSIS — E538 Deficiency of other specified B group vitamins: Secondary | ICD-10-CM

## 2021-06-24 DIAGNOSIS — Z1329 Encounter for screening for other suspected endocrine disorder: Secondary | ICD-10-CM

## 2021-06-24 LAB — COMPREHENSIVE METABOLIC PANEL
ALT: 16 U/L (ref 0–35)
AST: 20 U/L (ref 0–37)
Albumin: 4.2 g/dL (ref 3.5–5.2)
Alkaline Phosphatase: 73 U/L (ref 39–117)
BUN: 17 mg/dL (ref 6–23)
CO2: 29 mEq/L (ref 19–32)
Calcium: 9.4 mg/dL (ref 8.4–10.5)
Chloride: 104 mEq/L (ref 96–112)
Creatinine, Ser: 0.67 mg/dL (ref 0.40–1.20)
GFR: 97.88 mL/min (ref >60.00)
Glucose, Bld: 96 mg/dL (ref 70–99)
Potassium: 4.6 mEq/L (ref 3.5–5.1)
Sodium: 139 mEq/L (ref 135–145)
Total Bilirubin: 0.6 mg/dL (ref 0.2–1.2)
Total Protein: 7.6 g/dL (ref 6.0–8.3)

## 2021-06-24 LAB — CBC WITH DIFFERENTIAL/PLATELET
Basophils Absolute: 0 10*3/uL (ref 0.0–0.1)
Basophils Relative: 1.1 % (ref 0.0–3.0)
Eosinophils Absolute: 0.1 10*3/uL (ref 0.0–0.7)
Eosinophils Relative: 2.5 % (ref 0.0–5.0)
HCT: 36.2 % (ref 36.0–46.0)
Hemoglobin: 11.8 g/dL — ABNORMAL LOW (ref 12.0–15.0)
Lymphocytes Relative: 50.2 % — ABNORMAL HIGH (ref 12.0–46.0)
Lymphs Abs: 1.4 10*3/uL (ref 0.7–4.0)
MCHC: 32.6 g/dL (ref 30.0–36.0)
MCV: 82.2 fl (ref 78.0–100.0)
Monocytes Absolute: 0.2 10*3/uL (ref 0.1–1.0)
Monocytes Relative: 8 % (ref 3.0–12.0)
Neutro Abs: 1.1 10*3/uL — ABNORMAL LOW (ref 1.4–7.7)
Neutrophils Relative %: 38.2 % — ABNORMAL LOW (ref 43.0–77.0)
Platelets: 233 10*3/uL (ref 150.0–400.0)
RBC: 4.41 Mil/uL (ref 3.87–5.11)
RDW: 14.6 % (ref 11.5–15.5)
WBC: 2.8 10*3/uL — ABNORMAL LOW (ref 4.0–10.5)

## 2021-06-24 LAB — TSH: TSH: 1.31 u[IU]/mL (ref 0.35–5.50)

## 2021-06-24 LAB — LIPID PANEL
Cholesterol: 160 mg/dL (ref 0–200)
HDL: 71.2 mg/dL (ref >39.00)
LDL Cholesterol: 83 mg/dL (ref 0–99)
NonHDL: 88.64
Total CHOL/HDL Ratio: 2
Triglycerides: 30 mg/dL (ref 0.0–149.0)
VLDL: 6 mg/dL (ref 0.0–40.0)

## 2021-06-24 LAB — HEMOGLOBIN A1C: Hgb A1c MFr Bld: 5.9 % (ref 4.6–6.5)

## 2021-06-24 LAB — VITAMIN D 25 HYDROXY (VIT D DEFICIENCY, FRACTURES): VITD: 21.6 ng/mL — ABNORMAL LOW (ref 30.00–100.00)

## 2021-06-24 LAB — VITAMIN B12: Vitamin B-12: 371 pg/mL (ref 211–911)

## 2021-06-24 MED ORDER — CHOLECALCIFEROL 1.25 MG (50000 UT) PO CAPS: 50000.0000 [IU] | ORAL_CAPSULE | ORAL | 2 refills | Status: DC

## 2021-06-25 LAB — URINALYSIS, ROUTINE W REFLEX MICROSCOPIC
Bilirubin Urine: NEGATIVE
Glucose, UA: NEGATIVE
Hgb urine dipstick: NEGATIVE
Ketones, ur: NEGATIVE
Leukocytes,Ua: NEGATIVE
Nitrite: NEGATIVE
Protein, ur: NEGATIVE
Specific Gravity, Urine: 1.02 (ref 1.001–1.035)
pH: 6 (ref 5.0–8.0)

## 2021-06-27 ENCOUNTER — Telehealth: Payer: Self-pay

## 2021-06-27 DIAGNOSIS — E538 Deficiency of other specified B group vitamins: Secondary | ICD-10-CM

## 2021-06-27 DIAGNOSIS — D709 Neutropenia, unspecified: Secondary | ICD-10-CM

## 2021-06-27 NOTE — Telephone Encounter (Signed)
Error

## 2021-06-27 NOTE — Telephone Encounter (Signed)
Received  message from PCP that pt has low White blood ell count.   Please schedule pt for labs in Jan, then MD a few days after labs. Plese inform pt of appts.

## 2021-06-27 NOTE — Telephone Encounter (Signed)
-----   Message from Rickard Patience, MD sent at 06/26/2021  8:11 PM EST ----- Ok. Thanks. Will reach out for follow up. Chronic neutropenia is likely ethnic.   Lanora Manis, please arrange patient to do a follow up in January. Thanks. Labs prior to MD cbc smear, vitamin B12, folate, iron TIBC ferritin. Thanks.   ----- Message ----- From: McLean-Scocuzza, Pasty Spillers, MD Sent: 06/24/2021   7:18 PM EST To: Rickard Patience, MD, Tilford Pillar, CMA  Cholesterol normal  Liver kidneys normal  A1c 5.9=prediabetes B12 normal  Vit D low  -rec D3 50K IU 1x per week  Thyroid lab normal  White blood cell ct low and anemic  -please follow up with hematology  Urine pending

## 2021-07-02 ENCOUNTER — Ambulatory Visit (INDEPENDENT_AMBULATORY_CARE_PROVIDER_SITE_OTHER): Payer: Federal, State, Local not specified - PPO | Admitting: Internal Medicine

## 2021-07-02 ENCOUNTER — Encounter: Payer: Self-pay | Admitting: Internal Medicine

## 2021-07-02 ENCOUNTER — Other Ambulatory Visit (HOSPITAL_COMMUNITY)
Admission: RE | Admit: 2021-07-02 | Discharge: 2021-07-02 | Disposition: A | Discharge status: Home / Self Care | Payer: Federal, State, Local not specified - PPO | Source: Ambulatory Visit | Attending: Internal Medicine | Admitting: Internal Medicine

## 2021-07-02 ENCOUNTER — Other Ambulatory Visit: Payer: Self-pay

## 2021-07-02 VITALS — BP 124/80 | HR 76 | Temp 97.0°F | Ht 66.0 in | Wt 244.0 lb

## 2021-07-02 DIAGNOSIS — N761 Subacute and chronic vaginitis: Secondary | ICD-10-CM

## 2021-07-02 DIAGNOSIS — M79672 Pain in left foot: Secondary | ICD-10-CM

## 2021-07-02 DIAGNOSIS — M1990 Unspecified osteoarthritis, unspecified site: Secondary | ICD-10-CM

## 2021-07-02 DIAGNOSIS — E559 Vitamin D deficiency, unspecified: Secondary | ICD-10-CM

## 2021-07-02 DIAGNOSIS — Z23 Encounter for immunization: Secondary | ICD-10-CM

## 2021-07-02 DIAGNOSIS — R7303 Prediabetes: Secondary | ICD-10-CM

## 2021-07-02 DIAGNOSIS — J301 Allergic rhinitis due to pollen: Secondary | ICD-10-CM | POA: Diagnosis not present

## 2021-07-02 DIAGNOSIS — D72819 Decreased white blood cell count, unspecified: Secondary | ICD-10-CM

## 2021-07-02 DIAGNOSIS — Z0000 Encounter for general adult medical examination without abnormal findings: Secondary | ICD-10-CM

## 2021-07-02 DIAGNOSIS — Z1231 Encounter for screening mammogram for malignant neoplasm of breast: Secondary | ICD-10-CM

## 2021-07-02 DIAGNOSIS — Z1329 Encounter for screening for other suspected endocrine disorder: Secondary | ICD-10-CM

## 2021-07-02 DIAGNOSIS — J4520 Mild intermittent asthma, uncomplicated: Secondary | ICD-10-CM

## 2021-07-02 DIAGNOSIS — Z1389 Encounter for screening for other disorder: Secondary | ICD-10-CM

## 2021-07-02 MED ORDER — ALBUTEROL SULFATE HFA 108 (90 BASE) MCG/ACT IN AERS: INHALATION_SPRAY | RESPIRATORY_TRACT | 11 refills | Status: DC

## 2021-07-02 MED ORDER — MELOXICAM 15 MG PO TABS: ORAL_TABLET | ORAL | 1 refills | Status: DC

## 2021-07-02 MED ORDER — FLUTICASONE FUROATE-VILANTEROL 100-25 MCG/ACT IN AEPB
1.0000 | INHALATION_SPRAY | Freq: Every day | RESPIRATORY_TRACT | 11 refills | Status: DC
Start: 2021-07-02 — End: 2022-03-19

## 2021-07-02 MED ORDER — CETIRIZINE HCL 10 MG PO TABS: 10.0000 mg | ORAL_TABLET | Freq: Every day | ORAL | 3 refills | Status: DC | PRN

## 2021-07-02 MED ORDER — FLUTICASONE PROPIONATE 50 MCG/ACT NA SUSP: 2.0000 | Freq: Every day | NASAL | 11 refills | Status: DC

## 2021-07-02 MED ORDER — SALINE SPRAY 0.65 % NA SOLN: 1.0000 | NASAL | 11 refills | Status: DC | PRN

## 2021-07-02 NOTE — Progress Notes (Addendum)
Chief Complaint  Patient presents with   Annual Exam   Annual 1. Chronic left knee pain takes prn mobic not currently f/u with ortho due to bill due  2. Asthma controlled breo, albuterol  3. Vit d def    Review of Systems  Constitutional:  Negative for weight loss.  HENT:  Negative for hearing loss.   Eyes:  Negative for blurred vision.  Respiratory:  Negative for shortness of breath.   Cardiovascular:  Negative for chest pain.  Gastrointestinal:  Negative for abdominal pain and blood in stool.  Genitourinary:  Negative for dysuria.  Musculoskeletal:  Negative for falls and joint pain.  Skin:  Negative for rash.  Neurological:  Negative for headaches.  Psychiatric/Behavioral:  Negative for depression.   Past Medical History:  Diagnosis Date   Anemia    iron infusions in past   Arthritis    right knee (also torn meniscus)   Childhood asthma    COVID-19    11/30/20   Eczema    Iritis    Followed by Dr. Joya San   Motion sickness    back seat cars   Prediabetes    Radius fracture    right 09/21/19 [facility] conservative tx Dr. Peggye Ley   Right wrist fracture    Tear of meniscus of right knee    Urinary tract bacterial infections    Past Surgical History:  Procedure Laterality Date   BREAST BIOPSY  1988   CATARACT EXTRACTION W/ INTRAOCULAR LENS IMPLANT Left    b/l eye surgery for cataracts b/l right eye blurry after surgery    COLONOSCOPY WITH PROPOFOL N/A 03/05/2017   Procedure: COLONOSCOPY WITH PROPOFOL;  Surgeon: Lucilla Lame, MD;  Location: [facility];  Service: Endoscopy;  Laterality: N/A;   DILATION AND CURETTAGE OF UTERUS  1979   VAGINAL DELIVERY     2   Family History  Problem Relation Age of Onset   Alcohol abuse Maternal Aunt    Clotting disorder Mother    Arthritis Mother    Heart disease Mother    Bipolar disorder Brother    Stroke Maternal Grandmother    Cancer Maternal Aunt        lung   Asthma Son    Social History   Socioeconomic  History   Marital status: Single    Spouse name: Not on file   Number of children: 2   Years of education: Not on file   Highest education level: Not on file  Occupational History   Occupation: Mail carrier  Tobacco Use   Smoking status: Never   Smokeless tobacco: Never  Vaping Use   Vaping Use: Never used  Substance and Sexual Activity   Alcohol use: Yes    Alcohol/week: 3.0 standard drinks    Types: 3 Glasses of wine per week    Comment: Wine 3 reg pour/week   Drug use: No   Sexual activity: Not on file  Other Topics Concern   Not on file  Social History Narrative   Lives in [place] with sons, from [place].      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   Cholecalciferol 1.25 MG (50000 UT) capsule Take 1 capsule (50,000 Units total) by mouth once a week. D3 stop other vitamin D   ciclopirox (PENLAC) 8 % solution Apply topically at bedtime. Apply over nail and surrounding skin. Apply daily over previous coat. After seven (7) days, may remove with alcohol and continue cycle.   dorzolamide (TRUSOPT) 2 % ophthalmic solution Apply to eye.   DUREZOL 0.05 % EMUL PLACE 1 DROP b/l bid   ferrous sulfate 325 (65 FE) MG tablet Take 325 mg by mouth daily with breakfast.   fluticasone furoate-vilanterol (BREO ELLIPTA) 100-25 MCG/ACT AEPB Inhale 1 puff into the lungs daily.   hydrocortisone 2.5 % cream Apply topically 2 (two) times daily. Face and neck   Multiple Vitamin (MULTIVITAMIN) tablet Take 1 tablet by mouth daily.   sodium chloride  (OCEAN) 0.65 % SOLN nasal spray Place 1 spray into both nostrils as needed for congestion.   triamcinolone (KENALOG) 0.1 % Apply 1 application topically 2 (two) times daily. As needed   TURMERIC PO Take by mouth.   vitamin B-12 (CYANOCOBALAMIN) 1000 MCG tablet Take 1 tablet (1,000 mcg total) by mouth daily.   [DISCONTINUED] albuterol (PROAIR HFA) 108 (90 Base) MCG/ACT inhaler Inhale 2 puffs into the lungs every 4 hours as needed   [DISCONTINUED] aspirin 81 MG EC tablet Take by mouth.   [DISCONTINUED] fluticasone (FLONASE) 50 MCG/ACT nasal spray Place 2 sprays into both nostrils daily.   [DISCONTINUED] fluticasone furoate-vilanterol (BREO ELLIPTA) 100-25 MCG/INH AEPB TAKE 1 PUFF BY MOUTH EVERY DAY rinse mouth   [DISCONTINUED] meloxicam (MOBIC) 15 MG tablet meloxicam 15 mg tablet  TAKE 1 TABLET(S)daily prn DO NOT TAKE WITH IBUPROFEN   Allergies  Allergen Reactions   Other Hives    Allergic to tree nuts, but not to peanuts   Penicillins Hives   Tree Extract Hives   Amoxicillin Rash   Recent Results (from the past 2160 hour(s))  POCT urinalysis dip (device)     Status: Abnormal   Collection Time: 04/10/21  7:30 PM  Result Value Ref Range   Glucose, UA NEGATIVE NEGATIVE mg/dL   Bilirubin Urine NEGATIVE NEGATIVE   Ketones, ur NEGATIVE NEGATIVE mg/dL   Specific Gravity, Urine >=1.030 1.005 - 1.030   Hgb urine dipstick LARGE (A) NEGATIVE   pH 6.5 5.0 - 8.0   Protein, ur 100 (A) NEGATIVE mg/dL   Urobilinogen, UA 0.2 0.0 - 1.0 mg/dL   Nitrite NEGATIVE NEGATIVE   Leukocytes,Ua SMALL (A) NEGATIVE    Comment: Biochemical Testing Only. Please order routine urinalysis from main lab if confirmatory testing is needed.  Urine Culture     Status: Abnormal   Collection Time: 04/10/21  7:34 PM   Specimen: Urine, Clean Catch  Result Value Ref Range   Specimen Description      URINE, CLEAN CATCH Performed at [facility], [address], [place], [place] [address]    Special Requests       NONE Performed at [facility], [address], [place], [place] [address]    Culture MULTIPLE SPECIES PRESENT, SUGGEST RECOLLECTION (A)    Report Status 04/12/2021 FINAL   B12     Status: None   Collection Time: 06/24/21  9:56 AM  Result Value Ref Range  Vitamin B-12 371 211 - 911 pg/mL  Lipid panel     Status: None   Collection Time: 06/24/21  9:56 AM  Result Value Ref Range   Cholesterol 160 0 - 200 mg/dL    Comment: ATP III Classification       Desirable:  < 200 mg/dL               Borderline High:  200 - 239 mg/dL          High:  > = 240 mg/dL   Triglycerides 30.0 0.0 - 149.0 mg/dL    Comment: Normal:  <150 mg/dLBorderline High:  150 - 199 mg/dL   HDL 71.20 >39.00 mg/dL   VLDL 6.0 0.0 - 40.0 mg/dL   LDL Cholesterol 83 0 - 99 mg/dL   Total CHOL/HDL Ratio 2     Comment:                Men          Women1/2 Average Risk     3.4          3.3Average Risk          5.0          4.42X Average Risk          9.6          7.13X Average Risk          15.0          11.0                       NonHDL 88.64     Comment: NOTE:  Non-HDL goal should be 30 mg/dL higher than patient's LDL goal (i.e. LDL goal of < 70 mg/dL, would have non-HDL goal of < 100 mg/dL)  TSH     Status: None   Collection Time: 06/24/21  9:56 AM  Result Value Ref Range   TSH 1.31 0.35 - 5.50 uIU/mL  Urinalysis, Routine w reflex microscopic     Status: None   Collection Time: 06/24/21  9:56 AM  Result Value Ref Range   Color, Urine YELLOW YELLOW   APPearance CLEAR CLEAR   Specific Gravity, Urine 1.020 1.001 - 1.035   pH 6.0 5.0 - 8.0   Glucose, UA NEGATIVE NEGATIVE   Bilirubin Urine NEGATIVE NEGATIVE   Ketones, ur NEGATIVE NEGATIVE   Hgb urine dipstick NEGATIVE NEGATIVE   Protein, ur NEGATIVE NEGATIVE   Nitrite NEGATIVE NEGATIVE   Leukocytes,Ua NEGATIVE NEGATIVE  CBC with Differential/Platelet     Status: Abnormal   Collection Time: 06/24/21  9:56 AM  Result Value Ref Range   WBC 2.8 (L) 4.0 -  10.5 K/uL   RBC 4.41 3.87 - 5.11 Mil/uL   Hemoglobin 11.8 (L) 12.0 - 15.0 g/dL   HCT 36.2 36.0 - 46.0 %   MCV 82.2 78.0 - 100.0 fl   MCHC 32.6 30.0 - 36.0 g/dL   RDW 14.6 11.5 - 15.5 %   Platelets 233.0 150.0 - 400.0 K/uL   Neutrophils Relative % 38.2 (L) 43.0 - 77.0 %   Lymphocytes Relative 50.2 (H) 12.0 - 46.0 %   Monocytes Relative 8.0 3.0 - 12.0 %   Eosinophils Relative 2.5 0.0 - 5.0 %   Basophils Relative 1.1 0.0 - 3.0 %   Neutro Abs 1.1 (L) 1.4 - 7.7 K/uL   Lymphs Abs 1.4 0.7 - 4.0 K/uL   Monocytes Absolute 0.2 0.1 - 1.0 K/uL  Eosinophils Absolute 0.1 0.0 - 0.7 K/uL   Basophils Absolute 0.0 0.0 - 0.1 K/uL  Comprehensive metabolic panel     Status: None   Collection Time: 06/24/21  9:56 AM  Result Value Ref Range   Sodium 139 135 - 145 mEq/L   Potassium 4.6 3.5 - 5.1 mEq/L   Chloride 104 96 - 112 mEq/L   CO2 29 19 - 32 mEq/L   Glucose, Bld 96 70 - 99 mg/dL   BUN 17 6 - 23 mg/dL   Creatinine, Ser 0.67 0.40 - 1.20 mg/dL   Total Bilirubin 0.6 0.2 - 1.2 mg/dL   Alkaline Phosphatase 73 39 - 117 U/L   AST 20 0 - 37 U/L   ALT 16 0 - 35 U/L   Total Protein 7.6 6.0 - 8.3 g/dL   Albumin 4.2 3.5 - 5.2 g/dL   GFR 97.88 >60.00 mL/min    Comment: Calculated using the CKD-EPI Creatinine Equation (2021)   Calcium 9.4 8.4 - 10.5 mg/dL  Hemoglobin A1c     Status: None   Collection Time: 06/24/21  9:56 AM  Result Value Ref Range   Hgb A1c MFr Bld 5.9 4.6 - 6.5 %    Comment: Glycemic Control Guidelines for People with Diabetes:Non Diabetic:  <6%Goal of Therapy: <7%Additional Action Suggested:  >8%   VITAMIN D 25 Hydroxy (Vit-D Deficiency, Fractures)     Status: Abnormal   Collection Time: 06/24/21  9:56 AM  Result Value Ref Range   VITD 21.60 (L) 30.00 - 100.00 ng/mL   Objective  Body mass index is 39.38 kg/m. Wt Readings from Last 3 Encounters:  07/02/21 244 lb (110.7 kg)  04/10/21 240 lb (108.9 kg)  12/01/20 238 lb (108 kg)   Temp Readings from Last 3 Encounters:  07/02/21  (!) 97 F (36.1 C) (Temporal)  04/10/21 98.2 F (36.8 C) (Oral)  12/01/20 99.3 F (37.4 C) (Oral)   BP Readings from Last 3 Encounters:  07/02/21 124/80  04/10/21 139/76  12/01/20 107/62   Pulse Readings from Last 3 Encounters:  07/02/21 76  04/10/21 73  12/01/20 (!) 111    Physical Exam Vitals and nursing note reviewed.  Constitutional:      Appearance: Normal appearance. She is well-developed and well-groomed.  HENT:     Head: Normocephalic and atraumatic.  Eyes:     Conjunctiva/sclera: Conjunctivae normal.     Pupils: Pupils are equal, round, and reactive to light.  Cardiovascular:     Rate and Rhythm: Normal rate and regular rhythm.     Heart sounds: Normal heart sounds. No murmur heard. Pulmonary:     Effort: Pulmonary effort is normal.     Breath sounds: Normal breath sounds.  Abdominal:     General: Abdomen is flat. Bowel sounds are normal.     Tenderness: There is no abdominal tenderness.  Musculoskeletal:        General: No tenderness.  Skin:    General: Skin is warm and dry.  Neurological:     General: No focal deficit present.     Mental Status: She is alert and oriented to person, place, and time. Mental status is at baseline.     Cranial Nerves: Cranial nerves 2-12 are intact.     Gait: Gait is intact.  Psychiatric:        Attention and Perception: Attention and perception normal.        Mood and Affect: Mood and affect normal.  Speech: Speech normal.        Behavior: Behavior normal. Behavior is cooperative.        Thought Content: Thought content normal.        Cognition and Memory: Cognition and memory normal.        Judgment: Judgment normal.    Assessment  Plan  Annual physical exam - Plan: Comprehensive metabolic panel, Lipid panel, CBC with Differential/Platelet See below   Chronic allergic rhinitis due to pollen - Plan: fluticasone (FLONASE) 50 MCG/ACT nasal spray, cetirizine (ZYRTEC) 10 MG tablet, sodium chloride (OCEAN) 0.65 %  SOLN nasal spray   Chronic vaginitis - Plan: Urine cytology ancillary only([place])  Left foot pain - Plan: Ambulatory referral to Podiatry Dr. Daylene Katayama  Arthritis left knee - Plan: meloxicam (MOBIC) 15 MG tablet  Mild intermittent asthma without complication - Plan: albuterol (PROAIR HFA) 108 (90 Base) MCG/ACT inhaler, fluticasone furoate-vilanterol (BREO ELLIPTA) 100-25 MCG/ACT AEPB  Prediabetes - Plan: Hemoglobin A1c  Leukopenia, unspecified type - Plan: CBC with Differential/Platelet  F/u Dr. Tasia Catchings and anemia and neutropenia   HM flu shot utdgiven today utd Tdap immune mmr Not immune hep B status consider new vx in future  Consider shingrix and covid vaccine in future for now declines covid    -sch 09/11/20 negative [facility] BI ordered   Pap 06/27/20 neg pap neg hpv +BV 06/2020 will treat    Colonoscopy 03/05/17 [facility] repeat in 10 years Dr. Allen Norris   rec healthy diet and exercise     Morbid obesity with BMI of 40.0-44.9, adult ([place])  rec healthy diet and exercise    Provider: Dr. Olivia Mackie [facility]

## 2021-07-02 NOTE — Patient Instructions (Addendum)
Nasal saline  Flonase  2 sprays   Consider shingrix vaccine 2nd dose before 6 months    Zoster Vaccine, Recombinant injection What is this medication? ZOSTER VACCINE (ZOS ter vak SEEN) is a vaccine used to reduce the risk of getting shingles. This vaccine is not used to treat shingles or nerve pain from shingles. This medicine may be used for other purposes; ask your health care provider or pharmacist if you have questions. COMMON BRAND NAME(S): [facility] What should I tell my care team before I take this medication? They need to know if you have any of these conditions: cancer immune system problems an unusual or allergic reaction to Zoster vaccine, other medications, foods, dyes, or preservatives pregnant or trying to get pregnant breast-feeding How should I use this medication? This vaccine is injected into a muscle. It is given by a health care provider. A copy of Vaccine Information Statements will be given before each vaccination. Be sure to read this information carefully each time. This sheet may change often. Talk to your health care provider about the use of this vaccine in children. This vaccine is not approved for use in children. Overdosage: If you think you have taken too much of this medicine contact a poison control center or emergency room at once. NOTE: This medicine is only for you. Do not share this medicine with others. What if I miss a dose? Keep appointments for follow-up (booster) doses. It is important not to miss your dose. Call your health care provider if you are unable to keep an appointment. What may interact with this medication? medicines that suppress your immune system medicines to treat cancer steroid medicines like prednisone or cortisone This list may not describe all possible interactions. Give your health care provider a list of all the medicines, herbs, non-prescription drugs, or dietary supplements you use. Also tell them if you smoke, drink  alcohol, or use illegal drugs. Some items may interact with your medicine. What should I watch for while using this medication? Visit your health care provider regularly. This vaccine, like all vaccines, may not fully protect everyone. What side effects may I notice from receiving this medication? Side effects that you should report to your doctor or health care professional as soon as possible: allergic reactions (skin rash, itching or hives; swelling of the face, lips, or tongue) trouble breathing Side effects that usually do not require medical attention (report these to your doctor or health care professional if they continue or are bothersome): chills headache fever nausea pain, redness, or irritation at site where injected tiredness vomiting This list may not describe all possible side effects. Call your doctor for medical advice about side effects. You may report side effects to FDA at 1-800-FDA-1088. Where should I keep my medication? This vaccine is only given by a health care provider. It will not be stored at home. NOTE: This sheet is a summary. It may not cover all possible information. If you have questions about this medicine, talk to your doctor, pharmacist, or health care provider.  2022 Elsevier/Gold Standard (2021-03-26 00:00:00)   Prediabetes Prediabetes is when your blood sugar (blood glucose) level is higher than normal but not high enough for you to be diagnosed with type 2 diabetes. Having prediabetes puts you at risk for developing type 2 diabetes (type 2 diabetes mellitus). With certain lifestyle changes, you may be able to prevent or delay the onset of type 2 diabetes. This is important because type 2 diabetes can lead to  serious complications, such as: Heart disease. Stroke. Blindness. Kidney disease. Depression. Poor circulation in the feet and legs. In severe cases, this could lead to surgical removal of a leg (amputation). What are the causes? The exact  cause of prediabetes is not known. It may result from insulin resistance. Insulin resistance develops when cells in the body do not respond properly to insulin that the body makes. This can cause excess glucose to build up in the blood. High blood glucose (hyperglycemia) can develop. What increases the risk? The following factors may make you more likely to develop this condition: You have a family member with type 2 diabetes. You are older than 45 years. You had a temporary form of diabetes during a pregnancy (gestational diabetes). You had polycystic ovary syndrome (PCOS). You are overweight or obese. You are inactive (sedentary). You have a history of heart disease, including problems with cholesterol levels, high levels of blood fats, or high blood pressure. What are the signs or symptoms? You may have no symptoms. If you do have symptoms, they may include: Increased hunger. Increased thirst. Increased urination. Vision changes, such as blurry vision. Tiredness (fatigue). How is this diagnosed? This condition can be diagnosed with blood tests. Your blood glucose may be checked with one or more of the following tests: A fasting blood glucose (FBG) test. You will not be allowed to eat (you will fast) for at least 8 hours before a blood sample is taken. An A1C blood test (hemoglobin A1C). This test provides information about blood glucose levels over the previous 2?3 months. An oral glucose tolerance test (OGTT). This test measures your blood glucose at two points in time: After fasting. This is your baseline level. Two hours after you drink a beverage that contains glucose. You may be diagnosed with prediabetes if: Your FBG is 100?125 mg/dL (3.7-0.4 mmol/L). Your A1C level is 5.7?6.4% (39-46 mmol/mol). Your OGTT result is 140?199 mg/dL (8.8-89 mmol/L). These blood tests may be repeated to confirm your diagnosis. How is this treated? Treatment may include dietary and lifestyle changes  to help lower your blood glucose and prevent type 2 diabetes from developing. In some cases, medicine may be prescribed to help lower the risk of type 2 diabetes. Follow these instructions at home: Nutrition  Follow a healthy meal plan. This includes eating lean proteins, whole grains, legumes, fresh fruits and vegetables, low-fat dairy products, and healthy fats. Follow instructions from your health care provider about eating or drinking restrictions. Meet with a dietitian to create a healthy eating plan that is right for you. Lifestyle Do moderate-intensity exercise for at least 30 minutes a day on 5 or more days each week, or as told by your health care provider. A mix of activities may be best, such as: Brisk walking, swimming, biking, and weight lifting. Lose weight as told by your health care provider. Losing 5-7% of your body weight can reverse insulin resistance. Do not drink alcohol if: Your health care provider tells you not to drink. You are pregnant, may be pregnant, or are planning to become pregnant. If you drink alcohol: Limit how much you use to: 0-1 drink a day for women. 0-2 drinks a day for men. Be aware of how much alcohol is in your drink. In the [place] one drink equals one 12 oz bottle of beer (355 mL), one 5 oz glass of wine (148 mL), or one 1 oz glass of hard liquor (44 mL). General instructions Take over-the-counter and prescription medicines only as  told by your health care provider. You may be prescribed medicines that help lower the risk of type 2 diabetes. Do not use any products that contain nicotine or tobacco, such as cigarettes, e-cigarettes, and chewing tobacco. If you need help quitting, ask your health care provider. Keep all follow-up visits. This is important. Where to find more information American Diabetes Association: www.diabetes.org Academy of Nutrition and Dietetics: www.eatright.org American Heart Association: www.heart.org Contact a health care  provider if: You have any of these symptoms: Increased hunger. Increased urination. Increased thirst. Fatigue. Vision changes, such as blurry vision. Get help right away if you: Have shortness of breath. Feel confused. Vomit or feel like you may vomit. Summary Prediabetes is when your blood sugar (blood glucose)level is higher than normal but not high enough for you to be diagnosed with type 2 diabetes. Having prediabetes puts you at risk for developing type 2 diabetes (type 2 diabetes mellitus). Make lifestyle changes such as eating a healthy diet and exercising regularly to help prevent diabetes. Lose weight as told by your health care provider. This information is not intended to replace advice given to you by your health care provider. Make sure you discuss any questions you have with your health care provider. Document Revised: 10/06/2019 Document Reviewed: 10/06/2019 Elsevier Patient Education  2022 [employer].

## 2021-07-04 LAB — URINE CYTOLOGY ANCILLARY ONLY
Bacterial Vaginitis-Urine: NEGATIVE
Candida Urine: NEGATIVE
Chlamydia: NEGATIVE
Comment: NEGATIVE
Comment: NEGATIVE
Comment: NORMAL
Neisseria Gonorrhea: NEGATIVE
Trichomonas: NEGATIVE

## 2021-07-15 IMAGING — CT CT RENAL STONE PROTOCOL
2 of 4 series · 16 of 46 positions shown, 18 images · non-contrast
Comparison: November 11, 2013

CLINICAL DATA: Dysuria and hematuria

EXAM:
CT ABDOMEN AND PELVIS WITHOUT CONTRAST
TECHNIQUE: Multidetector CT imaging of the abdomen and pelvis was performed
following the standard protocol without oral or IV contrast.

[Series 2: renal stone · axial · 0.83mm/px · z∈[-1506,-1081]mm · 13 of 93 slices shown, 15 images (1 of 2)]
[im 4/93  soft-tissue]
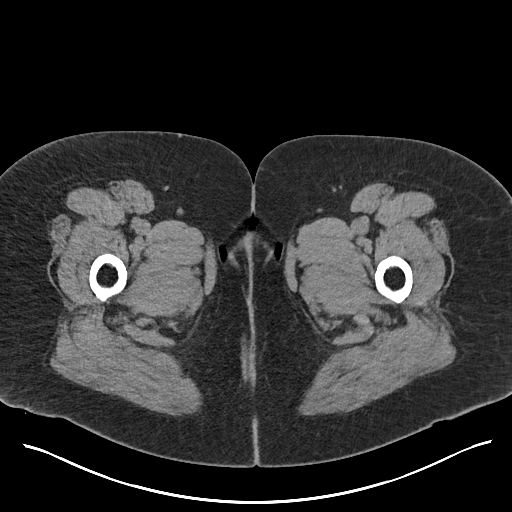
[im 4/93  bone]
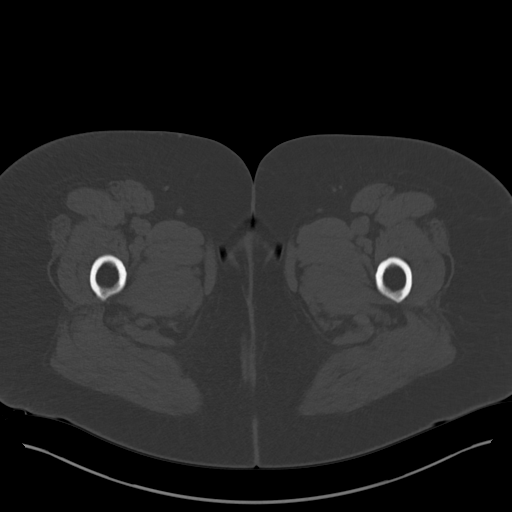
[im 12/93  soft-tissue]
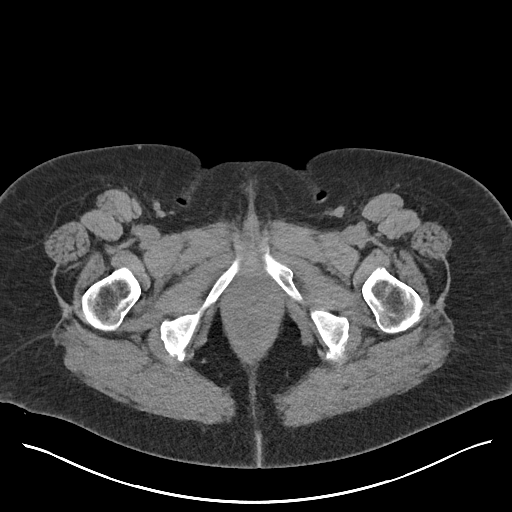
[im 20/93  soft-tissue]
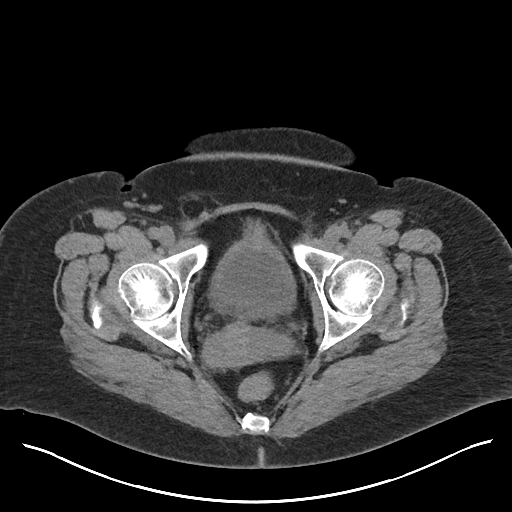
[im 27/93  soft-tissue]
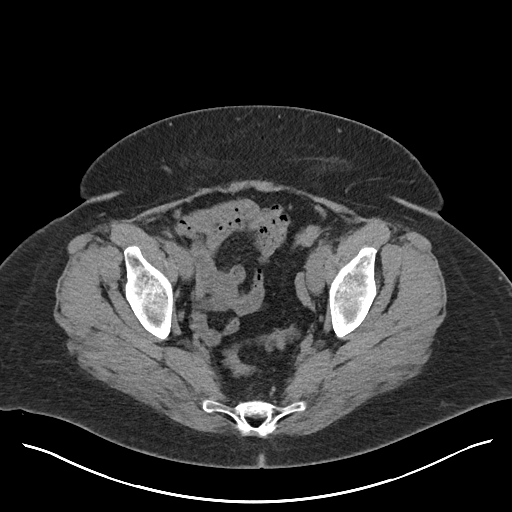
[im 31/93  soft-tissue]
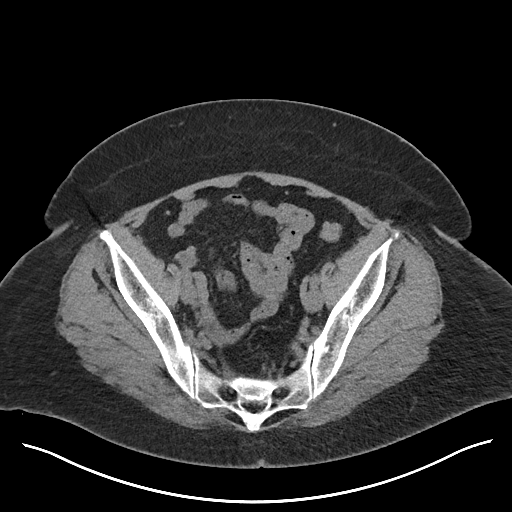
[im 39/93  soft-tissue]
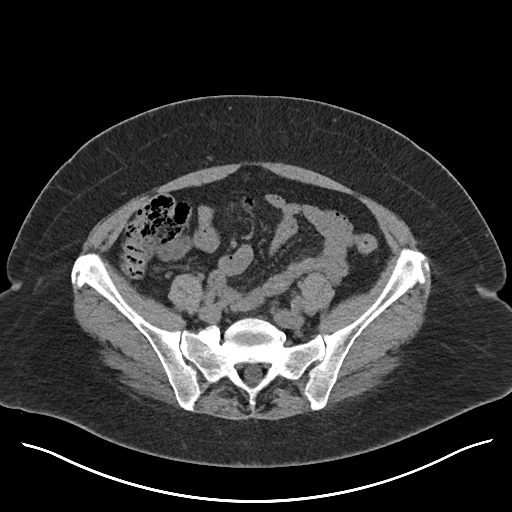
[im 47/93  soft-tissue]
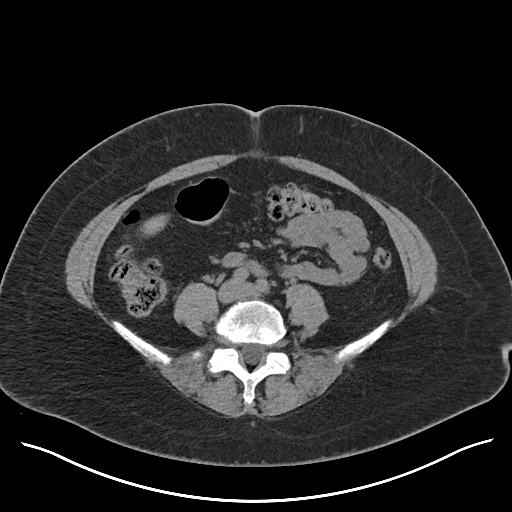
[im 54/93  soft-tissue]
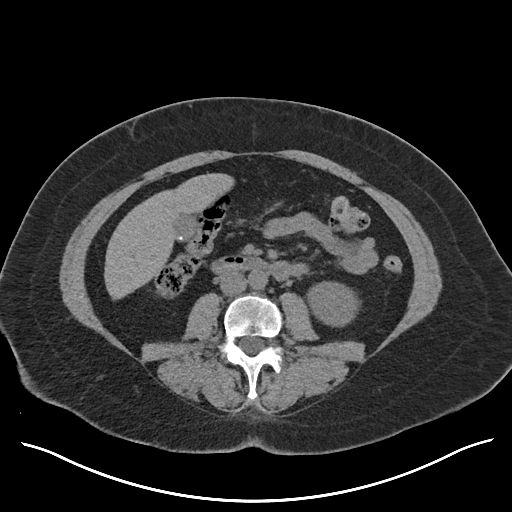
[im 62/93  soft-tissue]
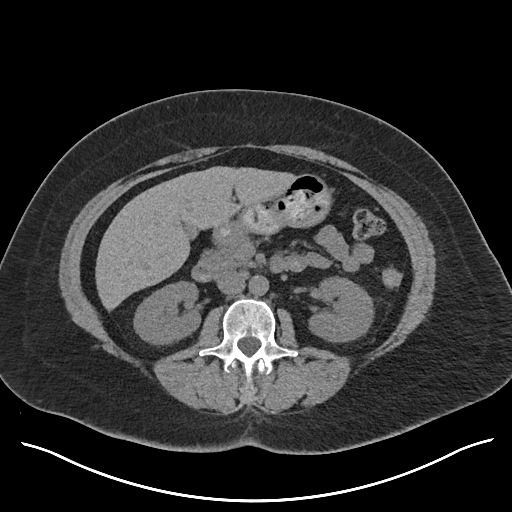
[im 62/93  bone]
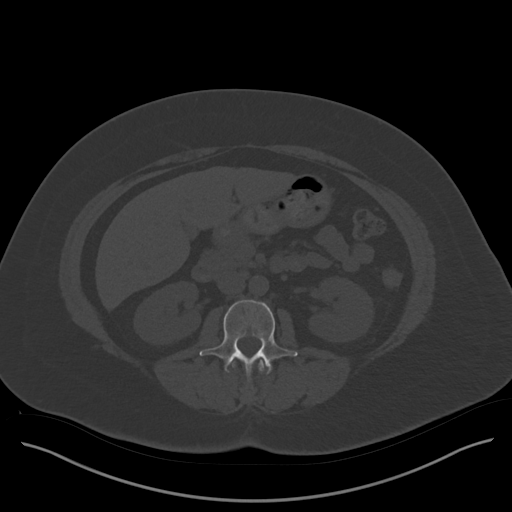
[im 66/93  soft-tissue]
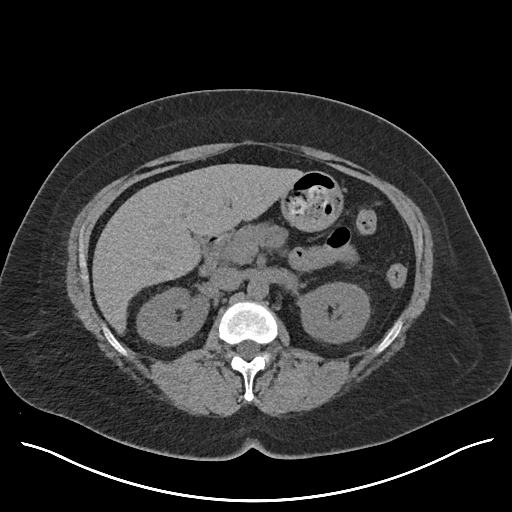
[im 73/93  soft-tissue]
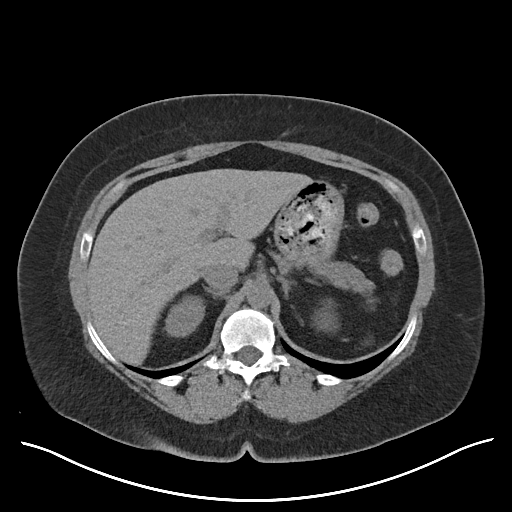
[im 81/93  soft-tissue]
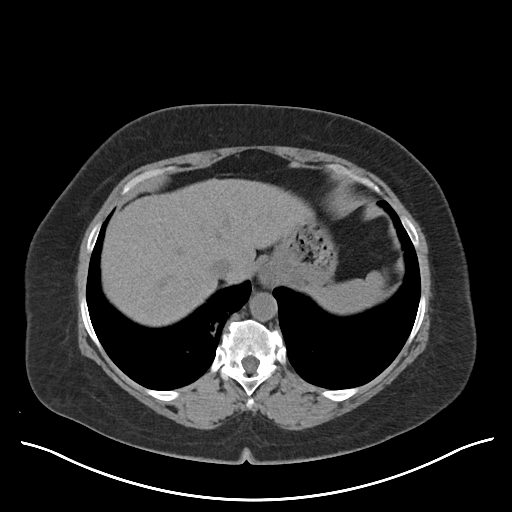
[im 89/93  soft-tissue]
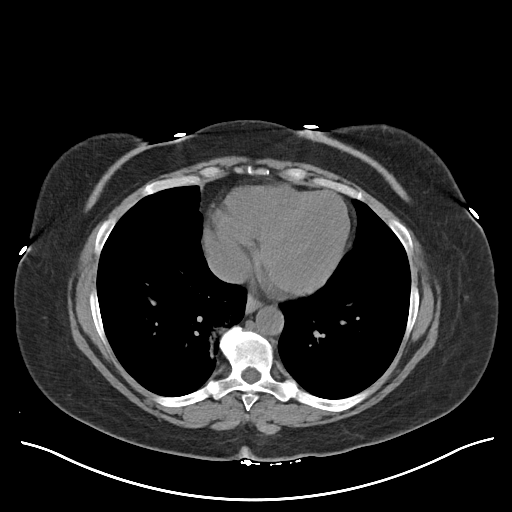

[Series 4: renal stone · coronal · 0.83mm/px · 3 of 160 slices shown (2 of 2)]
[im 54/160  soft-tissue]
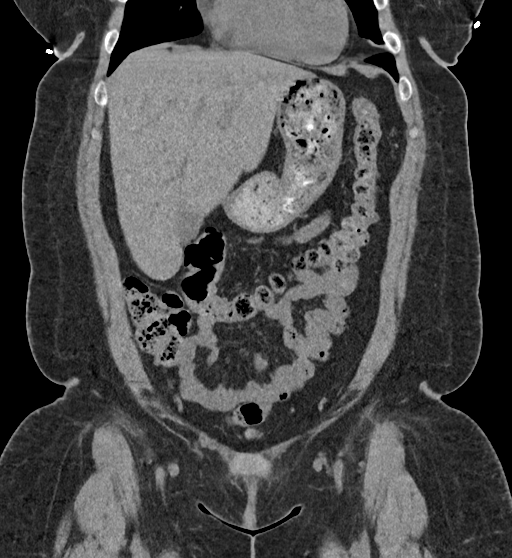
[im 71/160  soft-tissue]
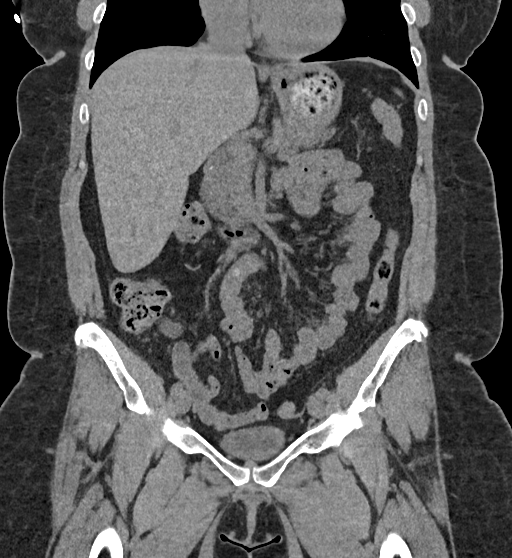
[im 89/160  soft-tissue]
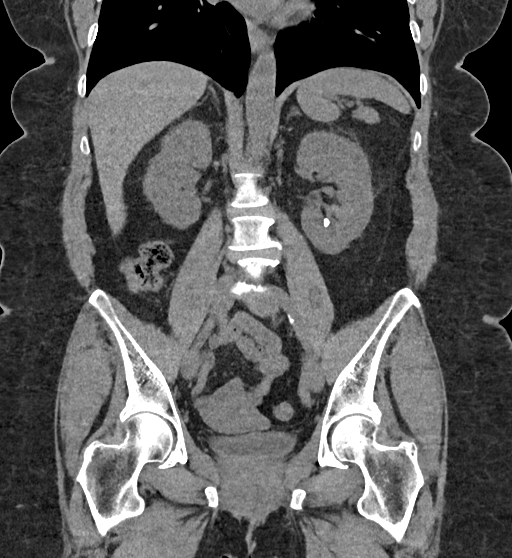

[16 of 46 positions shown; findings below may reference images not displayed]

FINDINGS: Lower chest: There is scarring and atelectatic change in the medial
right base, similar to prior study. Lung bases otherwise are clear.
There is a small hiatal hernia.

Hepatobiliary: Liver measures 19.1 cm in length. No focal liver
lesions are evident on this noncontrast enhanced study. There are
gallstones within the gallbladder. The gallbladder wall does not
appear appreciably thickened. There is no evident biliary duct
dilatation.

Pancreas: There is no pancreatic mass or inflammatory focus.

Spleen: No splenic lesions are evident.

Adrenals/Urinary Tract: Adrenals bilaterally appear unremarkable.
There is no evident renal mass or hydronephrosis on either side.
There is a calculus in the lower pole of the left kidney measuring 8
x 5 mm. There is a tiny calculus in the upper pole of the right
kidney. There is no evident ureteral calculus on either side.
Urinary bladder is midline. Urinary bladder wall appears mildly
thickened.

Stomach/Bowel: There is no appreciable bowel wall or mesenteric
thickening. There is moderate stool in the colon. There is no
evident bowel obstruction. The terminal ileum appears unremarkable.
There is no appreciable free air or portal venous air.

Vascular/Lymphatic: There is no abdominal aortic aneurysm. There are
foci of atherosclerotic calcification in the aorta and common iliac
arteries. No adenopathy is evident in the abdomen or pelvis.

Reproductive: Uterus is anteverted.  No pelvic mass evident.

Other: Appendix appears unremarkable. There is no abscess or ascites
in the abdomen or pelvis. A small umbilical hernia is present
containing only fat.

Musculoskeletal: There are foci of degenerative change in the lower
thoracic and lumbar regions. There are no blastic or lytic bone
lesions. There is no intramuscular lesion evident.
IMPRESSION: 1. Nonobstructing 8 x 6 mm calculus lower pole left kidney. Tiny
calculus upper pole right kidney. No hydronephrosis or ureteral
calculus on either side.

2. Mild thickening of the urinary bladder wall, potentially
representing a degree of cystitis.

3. Cholelithiasis. Gallbladder wall does not appear appreciably
thickened.

4. Small hiatal hernia. Small umbilical hernia containing only fat.

5. No bowel obstruction. No abscess in the abdomen or pelvis.
Appendix region appears normal.

6.  Aortic Atherosclerosis ([id number].E).

## 2021-07-17 ENCOUNTER — Other Ambulatory Visit: Payer: Self-pay | Admitting: *Deleted

## 2021-07-17 DIAGNOSIS — E538 Deficiency of other specified B group vitamins: Secondary | ICD-10-CM

## 2021-07-18 DIAGNOSIS — H35373 Puckering of macula, bilateral: Secondary | ICD-10-CM | POA: Diagnosis not present

## 2021-07-18 DIAGNOSIS — H2013 Chronic iridocyclitis, bilateral: Secondary | ICD-10-CM | POA: Diagnosis not present

## 2021-07-18 DIAGNOSIS — H21543 Posterior synechiae (iris), bilateral: Secondary | ICD-10-CM | POA: Diagnosis not present

## 2021-07-18 DIAGNOSIS — H35353 Cystoid macular degeneration, bilateral: Secondary | ICD-10-CM | POA: Diagnosis not present

## 2021-07-26 ENCOUNTER — Inpatient Hospital Stay: Payer: Federal, State, Local not specified - PPO | Attending: Nurse Practitioner

## 2021-07-26 ENCOUNTER — Telehealth: Payer: Self-pay | Admitting: Nurse Practitioner

## 2021-07-26 NOTE — Telephone Encounter (Signed)
Pt called to reschedule her appt for 1-6 & 1-16. Call back at [phone]

## 2021-07-29 NOTE — Telephone Encounter (Signed)
Abbie, I Left her a VM.

## 2021-08-05 ENCOUNTER — Inpatient Hospital Stay: Payer: Federal, State, Local not specified - PPO | Admitting: Nurse Practitioner

## 2021-08-05 ENCOUNTER — Other Ambulatory Visit: Payer: Federal, State, Local not specified - PPO

## 2021-08-05 ENCOUNTER — Ambulatory Visit: Payer: Federal, State, Local not specified - PPO | Admitting: Oncology

## 2021-08-06 ENCOUNTER — Ambulatory Visit: Payer: Federal, State, Local not specified - PPO | Admitting: Oncology

## 2021-10-04 ENCOUNTER — Encounter: Payer: Self-pay | Admitting: Internal Medicine

## 2021-10-04 ENCOUNTER — Telehealth (INDEPENDENT_AMBULATORY_CARE_PROVIDER_SITE_OTHER): Payer: Federal, State, Local not specified - PPO | Admitting: Internal Medicine

## 2021-10-04 ENCOUNTER — Other Ambulatory Visit: Payer: Self-pay

## 2021-10-04 ENCOUNTER — Telehealth: Payer: Self-pay | Admitting: Internal Medicine

## 2021-10-04 VITALS — Ht 66.0 in | Wt 244.0 lb

## 2021-10-04 DIAGNOSIS — J4521 Mild intermittent asthma with (acute) exacerbation: Secondary | ICD-10-CM

## 2021-10-04 MED ORDER — AZITHROMYCIN 250 MG PO TABS: ORAL_TABLET | ORAL | 0 refills | Status: AC

## 2021-10-04 MED ORDER — PREDNISONE 10 MG PO TABS: 10.0000 mg | ORAL_TABLET | Freq: Every day | ORAL | 0 refills | Status: DC

## 2021-10-04 NOTE — Progress Notes (Signed)
Onset of symptoms this past Wednesday. Patient states that her grand children were sick and she had watched them for four days. The grand children have been tested for COVID and RSV which were both negative. Patient has not been tested for anything.  ?

## 2021-10-04 NOTE — Telephone Encounter (Signed)
Pt called wanting to make an appointment because she is full of mucus and lightheaded since Wednesday sent to access nurse ?

## 2021-10-04 NOTE — Patient Instructions (Signed)
?  Mucinex dm green label for cough or robitussin DM  ?Multivitamin or below vitamins  ?Vitamin C 1000 mg daily.  ?Vitamin D3 4000 Iu (units) daily.  ?Zinc 100 mg daily.  ?Quercetin 250-500 mg 2 times per day   ?Elderberry  ?Oil of oregano  ?cepacol or chloroseptic spray ?Warm salt water gargles +hydrogen peroxide ?Sugar free cough drops  ?Warm tea with honey and lemon  ?Hydration  ?Try to eat though you dont feel like it   ?Tylenol or Advil  ?Nasal saline and Flonase 2 sprays nasal congestion  ?If sneezing/runny nose over the counter allergy pill claritin,allegra, zyrtec, xyzal ?Quarantine x 10-14 days 14 days preferred  ? ?Monitor pulse oximeter, buy from [facility] if oxygen is less than 90 please go to the hospital.  ?   ?   ?Are you feeling really sick? Shortness of breath, cough, chest pain?, dizziness? Confusion  ? If so let me know  ?If worsening, go to hospital or [facility] clinic Urgent care for further treatment.    ?

## 2021-10-04 NOTE — Progress Notes (Signed)
Virtual Visit via Video Note ? ?I connected with Hannah Robinson ? on 10/04/21 at  3:50 PM EDT by a video enabled telemedicine application and verified that I am speaking with the correct person using two identifiers. ? Location patient: [name] ?Location provider:work or home office ?Persons participating in the virtual visit: patient, provider ? ?I discussed the limitations and requested verbal permission for telemedicine visit. The patient expressed understanding and agreed to proceed. ? ? ?HPI: ? ?Acute telemedicine visit for : ?H/o asthma kept grandkids recently x 4 days Friday to Monday/Tuesday and they have runny nose cough went to ped on Monday neg covid and rsv but she is having low back pain, +wheezing, cough, chest congestion and h/a. No sinus pain or sore throat, had chills, loss of appetite as well  ? ?She has home covid test to take and will my chart results feeling some better today did not go to work today  ?: ?-Pertinent past medical history: see below ?-Pertinent medication allergies: ?Allergies  ?Allergen Reactions  ? Other Hives  ?  Allergic to tree nuts, but not to peanuts  ? Penicillins Hives  ? Tree Extract Hives  ? Amoxicillin Rash  ? ?-COVID-19 vaccine status:  ?Immunization History  ?Administered Date(s) Administered  ? Influenza Whole 06/06/2013  ? Influenza,inj,Quad PF,6+ Mos 08/21/2016, 06/01/2017, 05/04/2019, 06/27/2020, 07/02/2021  ? Influenza-Unspecified 03/25/2014, 06/05/2014, 06/01/2017  ? PPD Test 01/18/2014  ? Pneumococcal-Unspecified 08/25/2012  ? Tdap 12/04/2014  ? ? ? ?ROS: See pertinent positives and negatives per HPI. ? ?Past Medical History:  ?Diagnosis Date  ? Anemia   ? iron infusions in past  ? Arthritis   ? right knee (also torn meniscus)  ? Childhood asthma   ? COVID-19   ? 11/30/20  ? Eczema   ? Iritis   ? Followed by Dr. Joya San  ? Motion sickness   ? back seat cars  ? Prediabetes   ? Radius fracture   ? right 09/21/19 [facility] conservative tx Dr. Peggye Ley  ? Right wrist  fracture   ? Tear of meniscus of right knee   ? Urinary tract bacterial infections   ? ? ?Past Surgical History:  ?Procedure Laterality Date  ? BREAST BIOPSY  1988  ? CATARACT EXTRACTION W/ INTRAOCULAR LENS IMPLANT Left   ? b/l eye surgery for cataracts b/l right eye blurry after surgery   ? COLONOSCOPY WITH PROPOFOL N/A 03/05/2017  ? Procedure: COLONOSCOPY WITH PROPOFOL;  Surgeon: Lucilla Lame, MD;  Location: [facility];  Service: Endoscopy;  Laterality: N/A;  ? [place] OF UTERUS  1979  ? VAGINAL DELIVERY    ? 2  ? ? ? ?Current Outpatient Medications:  ?  acetaminophen (TYLENOL) 500 MG tablet, Take 2 tablets (1,000 mg total) by mouth every 6 (six) hours as needed., Disp: 30 tablet, Rfl: 0 ?  albuterol (PROAIR HFA) 108 (90 Base) MCG/ACT inhaler, Inhale 2 puffs into the lungs every 4 hours as needed, Disp: 18 g, Rfl: 11 ?  azithromycin (ZITHROMAX) 250 MG tablet, With food Take 2 tablets on day 1, then 1 tablet daily on days 2 through 5, Disp: 6 tablet, Rfl: 0 ?  brimonidine (ALPHAGAN) 0.2 % ophthalmic solution, Apply to eye., Disp: , Rfl:  ?  Cholecalciferol 1.25 MG (50000 UT) capsule, Take 1 capsule (50,000 Units total) by mouth once a week. D3 stop other vitamin D, Disp: 13 capsule, Rfl: 2 ?  dorzolamide (TRUSOPT) 2 % ophthalmic solution, Apply to eye., Disp: ,  Rfl:  ?  DUREZOL 0.05 % EMUL, PLACE 1 DROP b/l bid, Disp: , Rfl: 6 ?  fluticasone (FLONASE) 50 MCG/ACT nasal spray, Place 2 sprays into both nostrils daily., Disp: 16 g, Rfl: 11 ?  fluticasone furoate-vilanterol (BREO ELLIPTA) 100-25 MCG/ACT AEPB, Inhale 1 puff into the lungs daily., Disp: 1 each, Rfl: 11 ?  meloxicam (MOBIC) 15 MG tablet, meloxicam 15 mg tablet  TAKE 1 TABLET(S)daily prn DO NOT TAKE WITH IBUPROFEN, Disp: 90 tablet, Rfl: 1 ?  Multiple Vitamin (MULTIVITAMIN) tablet, Take 1 tablet by mouth daily., Disp: , Rfl:  ?  predniSONE (DELTASONE) 10 MG tablet, Take 1 tablet (10 mg total) by mouth daily with breakfast., Disp: 7  tablet, Rfl: 0 ?  TURMERIC PO, Take by mouth., Disp: , Rfl:  ?  vitamin B-12 (CYANOCOBALAMIN) 1000 MCG tablet, Take 1 tablet (1,000 mcg total) by mouth daily., Disp: 90 tablet, Rfl: 3 ?  cetirizine (ZYRTEC) 10 MG tablet, Take 1 tablet (10 mg total) by mouth daily as needed. (Patient not taking: Reported on 10/04/2021), Disp: 90 tablet, Rfl: 3 ?  ciclopirox (PENLAC) 8 % solution, Apply topically at bedtime. Apply over nail and surrounding skin. Apply daily over previous coat. After seven (7) days, may remove with alcohol and continue cycle. (Patient not taking: Reported on 10/04/2021), Disp: 6.6 mL, Rfl: 11 ?  ferrous sulfate 325 (65 FE) MG tablet, Take 325 mg by mouth daily with breakfast. (Patient not taking: Reported on 10/04/2021), Disp: , Rfl:  ?  hydrocortisone 2.5 % cream, Apply topically 2 (two) times daily. Face and neck (Patient not taking: Reported on 10/04/2021), Disp: 60 g, Rfl: 11 ?  sodium chloride (OCEAN) 0.65 % SOLN nasal spray, Place 1 spray into both nostrils as needed for congestion. (Patient not taking: Reported on 10/04/2021), Disp: 30 mL, Rfl: 11 ?  triamcinolone (KENALOG) 0.1 %, Apply 1 application topically 2 (two) times daily. As needed (Patient not taking: Reported on 10/04/2021), Disp: 454 g, Rfl: 3 ? ?EXAM: ? ?VITALS per patient if applicable: ? ?GENERAL: alert, oriented, appears ill lying in bed but in no acute distress ? ?HEENT: atraumatic, conjunttiva clear, no obvious abnormalities on inspection of external nose and ears ? ?NECK: normal movements of the head and neck ? ?LUNGS: on inspection no signs of respiratory distress, breathing rate appears normal, no obvious gross SOB, gasping or wheezing ? ?CV: no obvious cyanosis ? ?MS: moves all visible extremities without noticeable abnormality ? ?PSYCH/NEURO: pleasant and cooperative, no obvious depression or anxiety, speech and thought processing grossly intact ? ?ASSESSMENT AND PLAN: ? ?Discussed the following assessment and plan: ? ?Mild  intermittent asthma with acute exacerbation could be due to URI r/o covid unable to test for flu, no strep signs, unable to test for rsv but grandkids negative for covid and rsv  ?- Plan: azithromycin (ZITHROMAX) 250 MG tablet, predniSONE (DELTASONE) 10 MG tablet ?Rec take covid 19 test and my chart  ?If not better let me know  ?If needing prescription strength medication we will need to make an appointment with a provider.  ?These are over the counter medication options:  ?Mucinex dm green label for cough or robitussin DM  ?Multivitamin or below vitamins  ?Vitamin C 1000 mg daily.  ?Vitamin D3 4000 Iu (units) daily.  ?Zinc 100 mg daily.  ?Quercetin 250-500 mg 2 times per day   ?Elderberry  ?Oil of oregano  ?cepacol or chloroseptic spray ?Warm salt water gargles +hydrogen peroxide ?Sugar free cough drops  ?Warm  tea with honey and lemon  ?Hydration  ?Try to eat though you dont feel like it   ?Tylenol or Advil  ?Nasal saline and Flonase 2 sprays nasal congestion  ?If sneezing/runny nose over the counter allergy pill claritin,allegra, zyrtec, xyzal ?Quarantine x 10-14 days 14 days preferred  ? ?Monitor pulse oximeter, buy from [facility] if oxygen is less than 90 please go to the hospital.  ?   ?   ?Are you feeling really sick? Shortness of breath, cough, chest pain?, dizziness? Confusion  ? If so let me know  ?If worsening, go to hospital or [facility] clinic Urgent care for further treatment.    ?-we discussed possible serious and likely etiologies, options for evaluation and workup, limitations of telemedicine visit vs in person visit, treatment, treatment risks and precautions. Pt is agreeable to treatment via telemedicine at this moment.  ?Work/School slipped offered: provided in patient instructions  yes ? ?I discussed the assessment and treatment plan with the patient. The patient was provided an opportunity to ask questions and all were answered. The patient agreed with the plan and demonstrated an understanding of  the instructions. ?  ? ?Time spent 20 min ?Nino Glow McLean-Scocuzza, MD   ?

## 2021-10-09 NOTE — Telephone Encounter (Signed)
Patient was seen virtually 10/04/21 ?

## 2021-10-10 ENCOUNTER — Encounter: Payer: Self-pay | Admitting: Internal Medicine

## 2021-10-10 DIAGNOSIS — Z1231 Encounter for screening mammogram for malignant neoplasm of breast: Secondary | ICD-10-CM | POA: Diagnosis not present

## 2021-10-10 LAB — HM MAMMOGRAPHY

## 2021-10-16 ENCOUNTER — Other Ambulatory Visit: Payer: Self-pay | Admitting: Internal Medicine

## 2021-10-16 DIAGNOSIS — J4520 Mild intermittent asthma, uncomplicated: Secondary | ICD-10-CM

## 2021-11-13 DIAGNOSIS — H11133 Conjunctival pigmentations, bilateral: Secondary | ICD-10-CM | POA: Diagnosis not present

## 2021-11-13 DIAGNOSIS — Z961 Presence of intraocular lens: Secondary | ICD-10-CM | POA: Diagnosis not present

## 2021-11-13 DIAGNOSIS — H4043X1 Glaucoma secondary to eye inflammation, bilateral, mild stage: Secondary | ICD-10-CM | POA: Diagnosis not present

## 2021-11-13 DIAGNOSIS — H02402 Unspecified ptosis of left eyelid: Secondary | ICD-10-CM | POA: Diagnosis not present

## 2021-11-21 IMAGING — CR DG WRIST COMPLETE 3+V*R*
4 series · 4 of 4 positions shown · non-contrast
Comparison: None.

CLINICAL DATA: Fall, right wrist/arm pain

EXAM:
RIGHT WRIST - COMPLETE 3+ VIEW

[wrist pa]
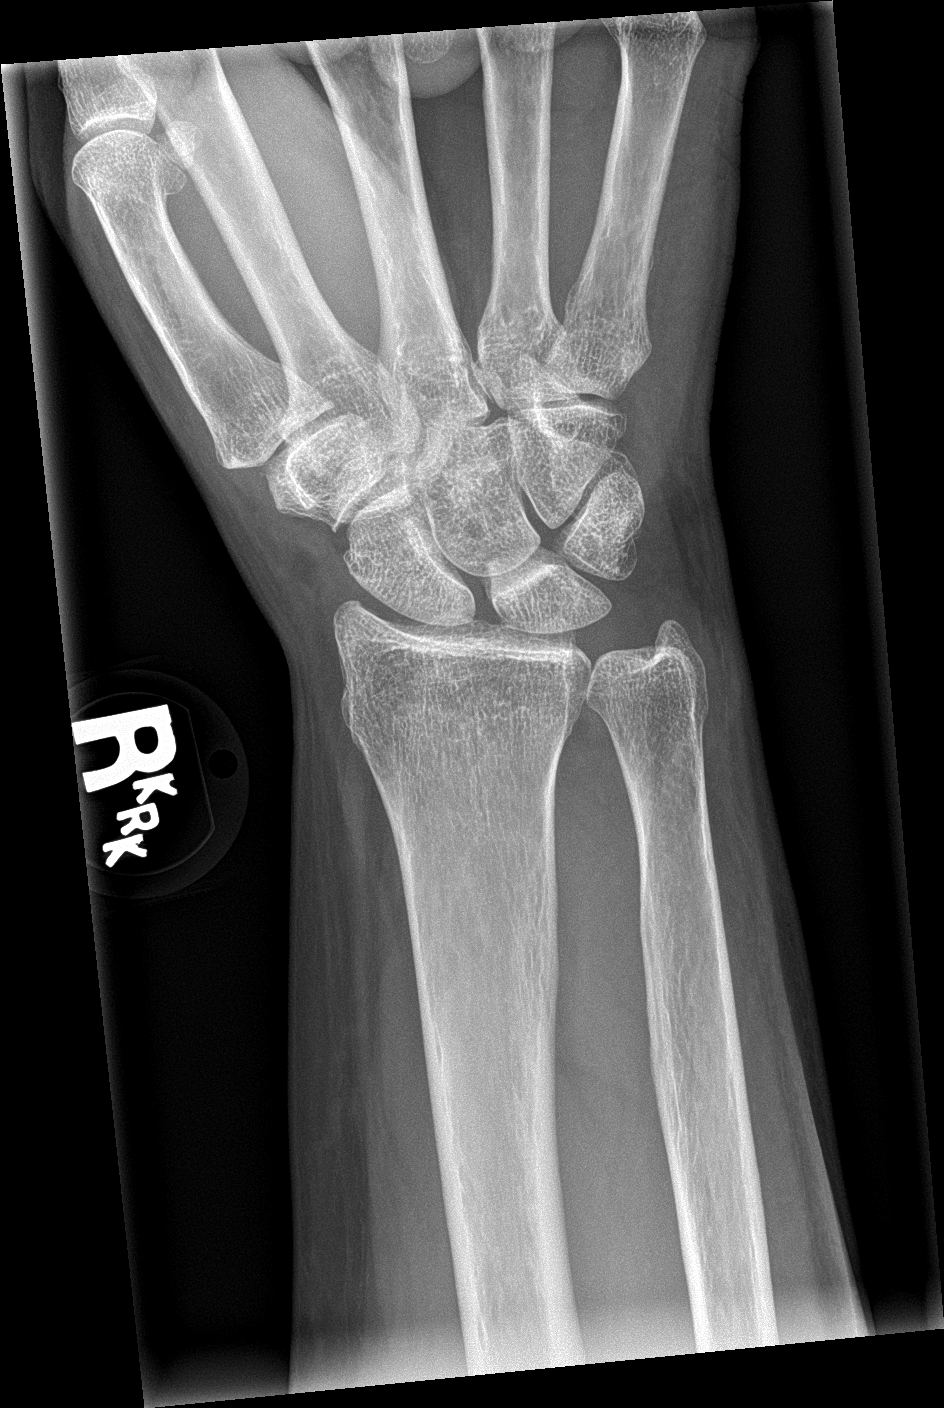

[wrist obl]
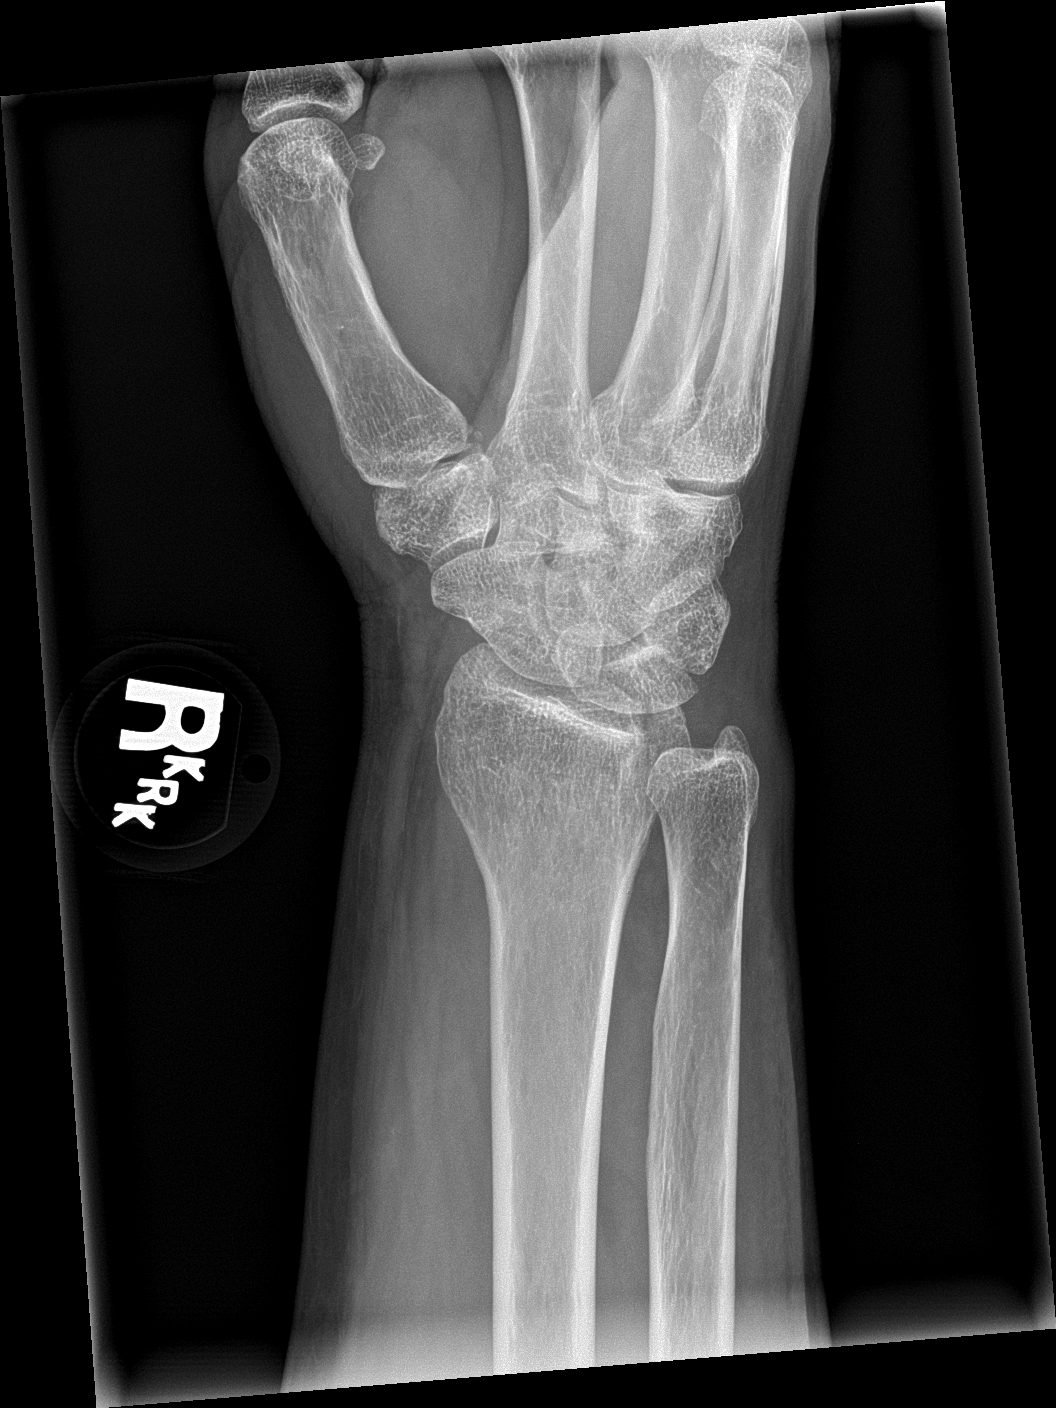

[wrist lat]
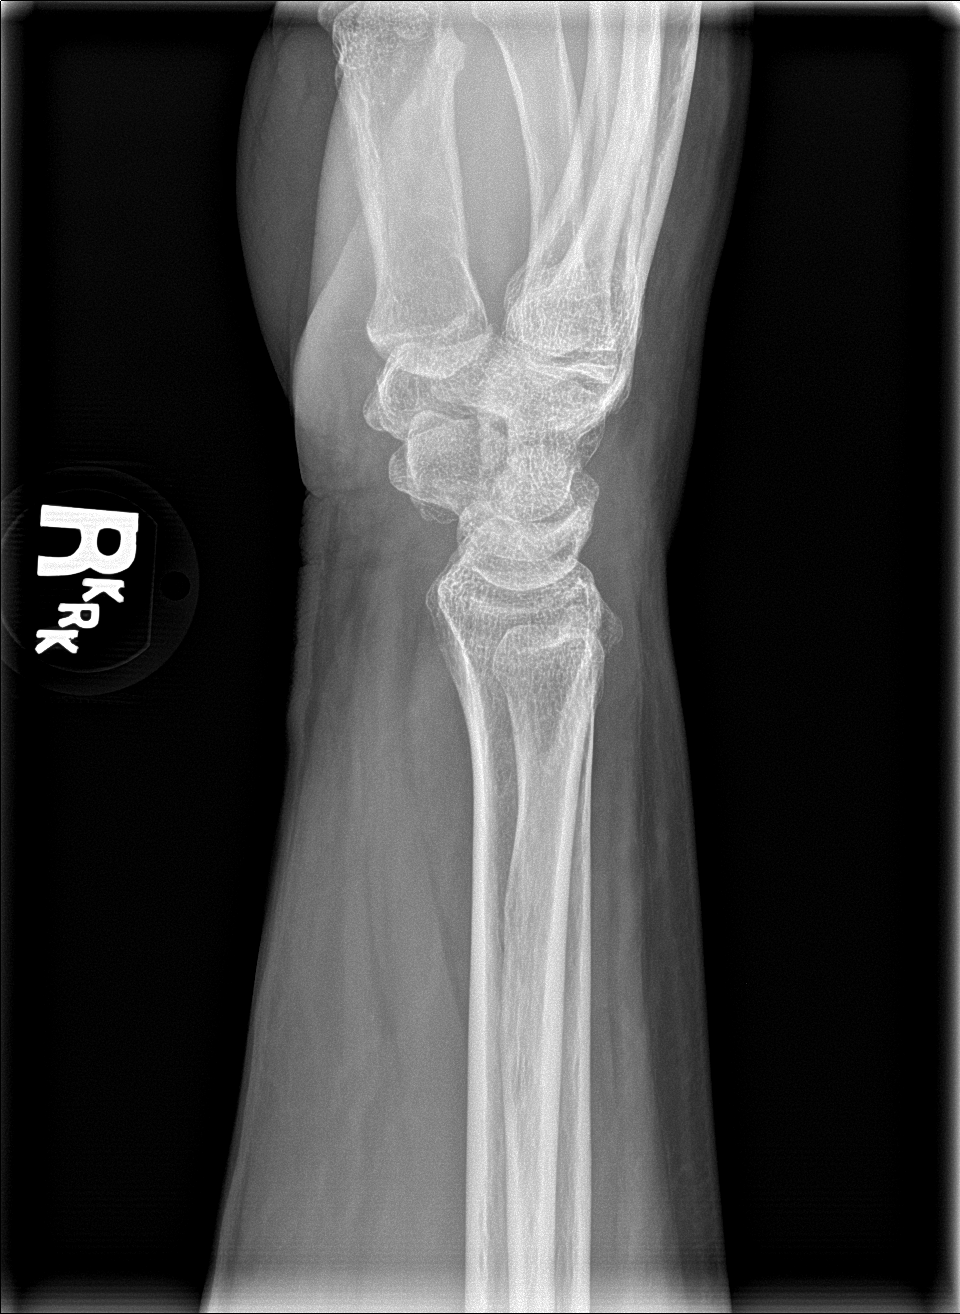

[wrist navicular]
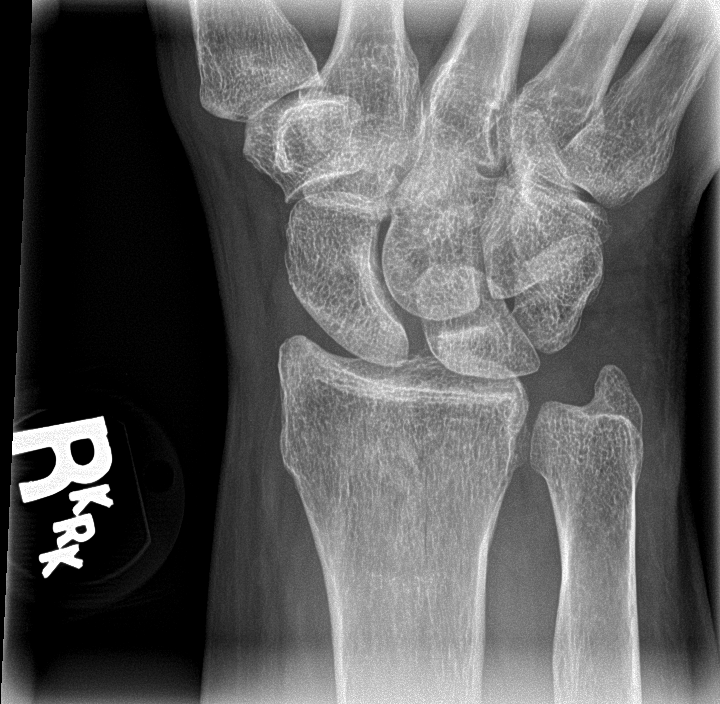

[4 of 4 positions shown; findings below may reference images not displayed]

FINDINGS: No fracture or dislocation is seen.

The joint spaces are preserved.

Visualized soft tissues are within normal limits.
IMPRESSION: Negative.

## 2022-01-02 DIAGNOSIS — H2013 Chronic iridocyclitis, bilateral: Secondary | ICD-10-CM | POA: Diagnosis not present

## 2022-01-02 DIAGNOSIS — H35373 Puckering of macula, bilateral: Secondary | ICD-10-CM | POA: Diagnosis not present

## 2022-01-02 DIAGNOSIS — H21543 Posterior synechiae (iris), bilateral: Secondary | ICD-10-CM | POA: Diagnosis not present

## 2022-01-02 DIAGNOSIS — H35353 Cystoid macular degeneration, bilateral: Secondary | ICD-10-CM | POA: Diagnosis not present

## 2022-02-03 ENCOUNTER — Telehealth: Payer: Self-pay | Admitting: Internal Medicine

## 2022-02-03 NOTE — Telephone Encounter (Signed)
S/w pt and informed her that PCP's last day is on 05/02/22 & all her pt's would be transferring to Dr.Walsh or pt's preference. 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 Pt verbalized understanding but she wanted to sch f/u appt before your PCP's departure. She has been sch to come in on 03/19/22 @8am  for f/u appt w/fasting labs

## 2022-02-03 NOTE — Telephone Encounter (Signed)
I spoke with pt she would like a call back regarding the next step with the new provider. Please advise and Thank you!  Call pt at [phone]

## 2022-02-26 ENCOUNTER — Ambulatory Visit: Payer: Federal, State, Local not specified - PPO | Admitting: Family

## 2022-02-26 ENCOUNTER — Encounter: Payer: Self-pay | Admitting: Family

## 2022-02-26 VITALS — BP 134/80 | HR 83 | Temp 99.0°F | Ht 66.0 in | Wt 241.6 lb

## 2022-02-26 DIAGNOSIS — J020 Streptococcal pharyngitis: Secondary | ICD-10-CM | POA: Diagnosis not present

## 2022-02-26 DIAGNOSIS — J029 Acute pharyngitis, unspecified: Secondary | ICD-10-CM | POA: Diagnosis not present

## 2022-02-26 LAB — POC COVID19 BINAXNOW: SARS Coronavirus 2 Ag: NEGATIVE

## 2022-02-26 LAB — POCT RAPID STREP A (OFFICE): Rapid Strep A Screen: POSITIVE — AB

## 2022-02-26 MED ORDER — AZITHROMYCIN 250 MG PO TABS: ORAL_TABLET | ORAL | 0 refills | Status: AC

## 2022-02-26 MED ORDER — LIDOCAINE VISCOUS HCL 2 % MT SOLN: 15.0000 mL | OROMUCOSAL | 0 refills | Status: DC | PRN

## 2022-02-26 NOTE — Patient Instructions (Signed)
Start azithromycin (antibiotic). Ensure to take probiotics while on antibiotics and also for 2 weeks after completion. This can either be by eating yogurt daily or taking a probiotic supplement over the counter such as Culturelle.It is important to re-colonize the gut with good bacteria and also to prevent any diarrheal infections associated with antibiotic use.   I also sent in lidocaine swish and swallow for the pain.  Salt water gargles as well. Please let me know how you are doing and make sure you are staying hydrated getting any rest.  Strep Throat, Adult Strep throat is an infection in the throat that is caused by bacteria. It is common during the cold months of the year. It mostly affects children who are 8-75 years old. However, people of all ages can get it at any time of the year. This infection spreads from person to person (is contagious) through coughing, sneezing, or having close contact. Your health care provider may use other names to describe the infection. When strep throat affects the tonsils, it is called tonsillitis. When it affects the back of the throat, it is called pharyngitis. What are the causes? This condition is caused by the Streptococcus pyogenes bacteria. What increases the risk? You are more likely to develop this condition if: You care for school-age children, or are around school-age children. Children are more likely to get strep throat and may spread it to others. You spend time in crowded places where the infection can spread easily. You have close contact with someone who has strep throat. What are the signs or symptoms? Symptoms of this condition include: Fever or chills. Redness, swelling, or pain in the tonsils or throat. Pain or difficulty when swallowing. White or yellow spots on the tonsils or throat. Tender glands in the neck and under the jaw. Bad smelling breath. Red rash all over the body. This is rare. How is this diagnosed? This condition  is diagnosed by tests that check for the presence and the amount of bacteria that cause strep throat. They are: Rapid strep test. Your throat is swabbed and checked for the presence of bacteria. Results are usually ready in minutes. Throat culture test. Your throat is swabbed. The sample is placed in a cup that allows infections to grow. Results are usually ready in 1 or 2 days. How is this treated? This condition may be treated with: Medicines that kill germs (antibiotics). Medicines that relieve pain or fever. These include: Ibuprofen or acetaminophen. Aspirin, only for people who are over the age of 41. Throat lozenges. Throat sprays. Follow these instructions at home: Medicines  Take over-the-counter and prescription medicines only as told by your health care provider. Take your antibiotic medicine as told by your health care provider. Do not stop taking the antibiotic even if you start to feel better. Eating and drinking  If you have trouble swallowing, try eating soft foods until your sore throat feels better. Drink enough fluid to keep your urine pale yellow. To help relieve pain, you may have: Warm fluids, such as soup and tea. Cold fluids, such as frozen desserts or popsicles. General instructions Gargle with a salt-water mixture 3-4 times a day or as needed. To make a salt-water mixture, completely dissolve -1 tsp (3-6 g) of salt in 1 cup (237 mL) of warm water. Get plenty of rest. Stay home from work or school until you have been taking antibiotics for 24 hours. Do not use any products that contain nicotine or tobacco. These products include  cigarettes, chewing tobacco, and vaping devices, such as e-cigarettes. If you need help quitting, ask your health care provider. It is up to you to get your test results. Ask your health care provider, or the department that is doing the test, when your results will be ready. Keep all follow-up visits. This is important. How is this  prevented?  Do not share food, drinking cups, or personal items that could cause the infection to spread to other people. Wash your hands often with soap and water for at least 20 seconds. If soap and water are not available, use hand sanitizer. Make sure that all people in your house wash their hands well. Have family members tested if they have a sore throat or fever. They may need an antibiotic if they have strep throat. Contact a health care provider if: You have swelling in your neck that keeps getting bigger. You develop a rash, cough, or earache. You cough up a thick mucus that is green, yellow-brown, or bloody. You have pain or discomfort that does not get better with medicine. Your symptoms seem to be getting worse. You have a fever. Get help right away if: You have new symptoms, such as vomiting, severe headache, stiff or painful neck, chest pain, or shortness of breath. You have severe throat pain, drooling, or changes in your voice. You have swelling of the neck, or the skin on the neck becomes red and tender. You have signs of dehydration, such as tiredness (fatigue), dry mouth, and decreased urination. You become increasingly sleepy, or you cannot wake up completely. Your joints become red or painful. These symptoms may represent a serious problem that is an emergency. Do not wait to see if the symptoms will go away. Get medical help right away. Call your local emergency services (911 in the [place] Do not drive yourself to the hospital. Summary Strep throat is an infection in the throat that is caused by the Streptococcus pyogenes bacteria. This infection is spread from person to person (is contagious) through coughing, sneezing, or having close contact. Take your medicines, including antibiotics, as told by your health care provider. Do not stop taking the antibiotic even if you start to feel better. To prevent the spread of germs, wash your hands well with soap and water. Have  others do the same. Do not share food, drinking cups, or personal items. Get help right away if you have new symptoms, such as vomiting, severe headache, stiff or painful neck, chest pain, or shortness of breath. This information is not intended to replace advice given to you by your health care provider. Make sure you discuss any questions you have with your health care provider. Document Revised: 10/30/2020 Document Reviewed: 10/30/2020 Elsevier Patient Education  2023 [employer].

## 2022-02-26 NOTE — Progress Notes (Signed)
Ordered Poc covid ... and rapid strep

## 2022-02-26 NOTE — Assessment & Plan Note (Signed)
Patient well-appearing and nontoxic in appearance she is afebrile.  Strep test is positive.  COVID test is negative.  She is penicillin allergic.  I have sent azithromycin and lidocaine swish and swallow.  Work note provided.  She will let me know how she is doing.

## 2022-02-26 NOTE — Progress Notes (Signed)
Subjective:    Patient ID: Hannah Robinson, female    DOB: [birth year], 56 y.o.   MRN: [id number]  CC: Hannah Robinson is a 56 y.o. female who presents today for an acute visit.    HPI: Acute visit for left sore throat x 4 days ago  Endorses fatigue, painful swallowing  No trouble swallowing, fever, cough, sob, ear pain, sinus pain or wheezing    She kept her grandchildren last week       History of asthma.  She is compliant with Breo.  She feels asthma symptoms are overall well-controlled..  She rinses her mouth after use of Breo HISTORY:  Past Medical History:  Diagnosis Date   Anemia    iron infusions in past   Arthritis    right knee (also torn meniscus)   Childhood asthma    COVID-19    11/30/20   Eczema    Iritis    Followed by Dr. Marti Sleigh   Motion sickness    back seat cars   Prediabetes    Radius fracture    right [facility] Dr. Stephenie Acres   Right wrist fracture    Tear of meniscus of right knee    Urinary tract bacterial infections    Past Surgical History:  Procedure Laterality Date   BREAST BIOPSY  1988   CATARACT EXTRACTION W/ INTRAOCULAR LENS IMPLANT Left    b/l eye surgery for cataracts b/l right eye blurry after surgery    COLONOSCOPY WITH PROPOFOL N/A 03/05/2017   Procedure: COLONOSCOPY WITH PROPOFOL;  Surgeon: Midge Minium, MD;  Location: [facility];  Service: Endoscopy;  Laterality: N/A;   DILATION AND CURETTAGE OF UTERUS  1979   VAGINAL DELIVERY     2   Family History  Problem Relation Age of Onset   Alcohol abuse Maternal Aunt    Clotting disorder Mother    Arthritis Mother    Heart disease Mother    Bipolar disorder Brother    Stroke Maternal Grandmother    Cancer Maternal Aunt        lung   Asthma Son     Allergies: Other, Penicillins, Tree extract, and Amoxicillin Current Outpatient Medications on File Prior to Visit  Medication Sig Dispense Refill   acetaminophen (TYLENOL) 500 MG tablet Take  2 tablets (1,000 mg total) by mouth every 6 (six) hours as needed. 30 tablet 0   albuterol (VENTOLIN HFA) 108 (90 Base) MCG/ACT inhaler INHALE 2 PUFFS BY MOUTH EVERY 4 HOURS AS NEEDED 18 each 5   brimonidine (ALPHAGAN) 0.2 % ophthalmic solution Apply to eye.     Cholecalciferol 1.25 MG (50000 UT) capsule Take 1 capsule (50,000 Units total) by mouth once a week. D3 stop other vitamin D 13 capsule 2   dorzolamide (TRUSOPT) 2 % ophthalmic solution Apply to eye.     DUREZOL 0.05 % EMUL PLACE 1 DROP b/l bid  6   fluticasone (FLONASE) 50 MCG/ACT nasal spray Place 2 sprays into both nostrils daily. 16 g 11   fluticasone furoate-vilanterol (BREO ELLIPTA) 100-25 MCG/ACT AEPB Inhale 1 puff into the lungs daily. 1 each 11   meloxicam (MOBIC) 15 MG tablet meloxicam 15 mg tablet  TAKE 1 TABLET(S)daily prn DO NOT TAKE WITH IBUPROFEN 90 tablet 1   Multiple Vitamin (MULTIVITAMIN) tablet Take 1 tablet by mouth daily.     predniSONE (DELTASONE) 10 MG tablet Take 1 tablet (10 mg total) by mouth daily with breakfast. 7 tablet  0   TURMERIC PO Take by mouth.     vitamin B-12 (CYANOCOBALAMIN) 1000 MCG tablet Take 1 tablet (1,000 mcg total) by mouth daily. 90 tablet 3   cetirizine (ZYRTEC) 10 MG tablet Take 1 tablet (10 mg total) by mouth daily as needed. (Patient not taking: Reported on 10/04/2021) 90 tablet 3   ciclopirox (PENLAC) 8 % solution Apply topically at bedtime. Apply over nail and surrounding skin. Apply daily over previous coat. After seven (7) days, may remove with alcohol and continue cycle. (Patient not taking: Reported on 10/04/2021) 6.6 mL 11   ferrous sulfate 325 (65 FE) MG tablet Take 325 mg by mouth daily with breakfast. (Patient not taking: Reported on 10/04/2021)     hydrocortisone 2.5 % cream Apply topically 2 (two) times daily. Face and neck (Patient not taking: Reported on 10/04/2021) 60 g 11   sodium chloride (OCEAN) 0.65 % SOLN nasal spray Place 1 spray into both nostrils as needed for congestion.  (Patient not taking: Reported on 10/04/2021) 30 mL 11   triamcinolone (KENALOG) 0.1 % Apply 1 application topically 2 (two) times daily. As needed (Patient not taking: Reported on 10/04/2021) 454 g 3   [DISCONTINUED] norethindrone (MICRONOR,CAMILA,ERRIN) 0.35 MG tablet Take 1 tablet by mouth daily.     No current facility-administered medications on file prior to visit.    Social History   Tobacco Use   Smoking status: Never   Smokeless tobacco: Never  Vaping Use   Vaping Use: Never used  Substance Use Topics   Alcohol use: Yes    Alcohol/week: 3.0 standard drinks of alcohol    Types: 3 Glasses of wine per week    Comment: Wine 3 reg pour/week   Drug use: No    Review of Systems  Constitutional:  Negative for chills and fever.  HENT:  Positive for sore throat (left). Negative for congestion and ear pain.   Respiratory:  Negative for cough and shortness of breath.   Cardiovascular:  Negative for chest pain and palpitations.  Gastrointestinal:  Negative for nausea and vomiting.      Objective:    BP 134/80 (BP Location: Left Arm, Patient Position: Sitting, Cuff Size: Normal)   Pulse 83   Temp 99 F (37.2 C) (Oral)   Ht 5\' 6"  (1.676 m)   Wt 241 lb 9.6 oz (109.6 kg)   SpO2 99%   BMI 39.00 kg/m    Physical Exam Vitals reviewed.  Constitutional:      Appearance: She is well-developed.  HENT:     Head: Normocephalic and atraumatic.     Right Ear: Hearing, tympanic membrane, ear canal and external ear normal. No decreased hearing noted. No drainage, swelling or tenderness. No middle ear effusion. No foreign body. Tympanic membrane is not erythematous or bulging.     Left Ear: Hearing, tympanic membrane, ear canal and external ear normal. No decreased hearing noted. No drainage, swelling or tenderness.  No middle ear effusion. No foreign body. Tympanic membrane is not erythematous or bulging.     Nose: Nose normal. No rhinorrhea.     Right Sinus: No maxillary sinus tenderness  or frontal sinus tenderness.     Left Sinus: No maxillary sinus tenderness or frontal sinus tenderness.     Mouth/Throat:     Pharynx: Uvula midline. Posterior oropharyngeal erythema present. No oropharyngeal exudate.     Tonsils: No tonsillar abscesses.     Comments: No white exudate or film in pharynx Eyes:  Conjunctiva/sclera: Conjunctivae normal.  Cardiovascular:     Rate and Rhythm: Regular rhythm.     Pulses: Normal pulses.     Heart sounds: Normal heart sounds.  Pulmonary:     Effort: Pulmonary effort is normal.     Breath sounds: Normal breath sounds. No wheezing, rhonchi or rales.  Lymphadenopathy:     Head:     Right side of head: No submental, submandibular, tonsillar, preauricular, posterior auricular or occipital adenopathy.     Left side of head: No submental, submandibular, tonsillar, preauricular, posterior auricular or occipital adenopathy.     Cervical: No cervical adenopathy.  Skin:    General: Skin is warm and dry.  Neurological:     Mental Status: She is alert.  Psychiatric:        Speech: Speech normal.        Behavior: Behavior normal.        Thought Content: Thought content normal.        Assessment & Plan:   Problem List Items Addressed This Visit       Respiratory   Strep pharyngitis - Primary    Patient well-appearing and nontoxic in appearance she is afebrile.  Strep test is positive.  COVID test is negative.  She is penicillin allergic.  I have sent azithromycin and lidocaine swish and swallow.  Work note provided.  She will let me know how she is doing.      Relevant Medications   azithromycin (ZITHROMAX) 250 MG tablet   lidocaine (XYLOCAINE) 2 % solution   Other Visit Diagnoses     Sore throat       Relevant Orders   POCT rapid strep A (Completed)   POC COVID-19 (Completed)         I am having Hannah Robinson start on azithromycin and lidocaine. I am also having her maintain her Durezol, ferrous sulfate, hydrocortisone,  cyanocobalamin, acetaminophen, TURMERIC PO, multivitamin, triamcinolone cream, ciclopirox, Cholecalciferol, brimonidine, dorzolamide, fluticasone, cetirizine, sodium chloride, meloxicam, fluticasone furoate-vilanterol, predniSONE, and albuterol.   Meds ordered this encounter  Medications   azithromycin (ZITHROMAX) 250 MG tablet    Sig: Take 2 tablets on day 1, then 1 tablet daily on days 2 through 5    Dispense:  6 tablet    Refill:  0    Order Specific Question:   Supervising Provider    Answer:   Duncan Dull L [[id number]]   lidocaine (XYLOCAINE) 2 % solution    Sig: Use as directed 15 mLs in the mouth or throat every 3 (three) hours as needed for mouth pain (gargle; may spit or swallow).    Dispense:  100 mL    Refill:  0    Order Specific Question:   Supervising Provider    Answer:   Sherlene Shams [[id number]]    Return precautions given.   Risks, benefits, and alternatives of the medications and treatment plan prescribed today were discussed, and patient expressed understanding.   Education regarding symptom management and diagnosis given to patient on AVS.  Continue to follow with McLean-Scocuzza, Pasty Spillers, MD for routine health maintenance.   [name] and I agreed with plan.   Rennie Plowman, FNP

## 2022-03-11 DIAGNOSIS — L84 Corns and callosities: Secondary | ICD-10-CM | POA: Diagnosis not present

## 2022-03-11 DIAGNOSIS — I872 Venous insufficiency (chronic) (peripheral): Secondary | ICD-10-CM | POA: Diagnosis not present

## 2022-03-19 ENCOUNTER — Ambulatory Visit: Payer: Federal, State, Local not specified - PPO | Admitting: Internal Medicine

## 2022-03-19 ENCOUNTER — Encounter: Payer: Self-pay | Admitting: Internal Medicine

## 2022-03-19 VITALS — BP 122/80 | HR 65 | Temp 97.8°F | Ht 66.0 in | Wt 245.2 lb

## 2022-03-19 DIAGNOSIS — E559 Vitamin D deficiency, unspecified: Secondary | ICD-10-CM

## 2022-03-19 DIAGNOSIS — Z1231 Encounter for screening mammogram for malignant neoplasm of breast: Secondary | ICD-10-CM

## 2022-03-19 DIAGNOSIS — B351 Tinea unguium: Secondary | ICD-10-CM | POA: Diagnosis not present

## 2022-03-19 DIAGNOSIS — M1990 Unspecified osteoarthritis, unspecified site: Secondary | ICD-10-CM

## 2022-03-19 DIAGNOSIS — J301 Allergic rhinitis due to pollen: Secondary | ICD-10-CM

## 2022-03-19 DIAGNOSIS — E611 Iron deficiency: Secondary | ICD-10-CM | POA: Diagnosis not present

## 2022-03-19 DIAGNOSIS — R7303 Prediabetes: Secondary | ICD-10-CM

## 2022-03-19 DIAGNOSIS — R5383 Other fatigue: Secondary | ICD-10-CM | POA: Diagnosis not present

## 2022-03-19 DIAGNOSIS — D72819 Decreased white blood cell count, unspecified: Secondary | ICD-10-CM | POA: Diagnosis not present

## 2022-03-19 DIAGNOSIS — J4520 Mild intermittent asthma, uncomplicated: Secondary | ICD-10-CM

## 2022-03-19 DIAGNOSIS — B9689 Other specified bacterial agents as the cause of diseases classified elsewhere: Secondary | ICD-10-CM

## 2022-03-19 DIAGNOSIS — N760 Acute vaginitis: Secondary | ICD-10-CM

## 2022-03-19 DIAGNOSIS — D709 Neutropenia, unspecified: Secondary | ICD-10-CM | POA: Diagnosis not present

## 2022-03-19 LAB — COMPREHENSIVE METABOLIC PANEL
ALT: 14 U/L (ref 0–35)
AST: 18 U/L (ref 0–37)
Albumin: 4 g/dL (ref 3.5–5.2)
Alkaline Phosphatase: 75 U/L (ref 39–117)
BUN: 18 mg/dL (ref 6–23)
CO2: 27 mEq/L (ref 19–32)
Calcium: 9.1 mg/dL (ref 8.4–10.5)
Chloride: 106 mEq/L (ref 96–112)
Creatinine, Ser: 0.63 mg/dL (ref 0.40–1.20)
GFR: 98.83 mL/min (ref >60.00)
Glucose, Bld: 91 mg/dL (ref 70–99)
Potassium: 4.1 mEq/L (ref 3.5–5.1)
Sodium: 141 mEq/L (ref 135–145)
Total Bilirubin: 0.6 mg/dL (ref 0.2–1.2)
Total Protein: 7.5 g/dL (ref 6.0–8.3)

## 2022-03-19 LAB — CBC WITH DIFFERENTIAL/PLATELET
Basophils Absolute: 0 10*3/uL (ref 0.0–0.1)
Basophils Relative: 0.9 % (ref 0.0–3.0)
Eosinophils Absolute: 0.1 10*3/uL (ref 0.0–0.7)
Eosinophils Relative: 3.5 % (ref 0.0–5.0)
HCT: 35.7 % — ABNORMAL LOW (ref 36.0–46.0)
Hemoglobin: 11.6 g/dL — ABNORMAL LOW (ref 12.0–15.0)
Lymphocytes Relative: 51.2 % — ABNORMAL HIGH (ref 12.0–46.0)
Lymphs Abs: 1.6 10*3/uL (ref 0.7–4.0)
MCHC: 32.5 g/dL (ref 30.0–36.0)
MCV: 80.9 fl (ref 78.0–100.0)
Monocytes Absolute: 0.3 10*3/uL (ref 0.1–1.0)
Monocytes Relative: 8.4 % (ref 3.0–12.0)
Neutro Abs: 1.1 10*3/uL — ABNORMAL LOW (ref 1.4–7.7)
Neutrophils Relative %: 36 % — ABNORMAL LOW (ref 43.0–77.0)
Platelets: 218 10*3/uL (ref 150.0–400.0)
RBC: 4.41 Mil/uL (ref 3.87–5.11)
RDW: 15.4 % (ref 11.5–15.5)
WBC: 3.1 10*3/uL — ABNORMAL LOW (ref 4.0–10.5)

## 2022-03-19 LAB — IBC + FERRITIN
Ferritin: 117.8 ng/mL (ref 10.0–291.0)
Iron: 73 ug/dL (ref 42–145)
Saturation Ratios: 29.1 % (ref 20.0–50.0)
TIBC: 250.6 ug/dL (ref 250.0–450.0)
Transferrin: 179 mg/dL — ABNORMAL LOW (ref 212.0–360.0)

## 2022-03-19 LAB — HEMOGLOBIN A1C: Hgb A1c MFr Bld: 6.1 % (ref 4.6–6.5)

## 2022-03-19 LAB — VITAMIN D 25 HYDROXY (VIT D DEFICIENCY, FRACTURES): VITD: 54.04 ng/mL (ref 30.00–100.00)

## 2022-03-19 MED ORDER — ALBUTEROL SULFATE HFA 108 (90 BASE) MCG/ACT IN AERS: INHALATION_SPRAY | RESPIRATORY_TRACT | 11 refills | Status: DC

## 2022-03-19 MED ORDER — MELOXICAM 15 MG PO TABS: ORAL_TABLET | ORAL | 1 refills | Status: AC

## 2022-03-19 MED ORDER — CICLOPIROX 8 % EX SOLN: Freq: Every day | CUTANEOUS | 11 refills | Status: DC

## 2022-03-19 MED ORDER — FLUTICASONE PROPIONATE 50 MCG/ACT NA SUSP: 2.0000 | Freq: Every day | NASAL | 11 refills | Status: DC

## 2022-03-19 MED ORDER — CETIRIZINE HCL 10 MG PO TABS: 10.0000 mg | ORAL_TABLET | Freq: Every day | ORAL | 3 refills | Status: DC | PRN

## 2022-03-19 MED ORDER — FLUTICASONE FUROATE-VILANTEROL 100-25 MCG/ACT IN AEPB: 1.0000 | INHALATION_SPRAY | Freq: Every day | RESPIRATORY_TRACT | 11 refills | Status: AC

## 2022-03-19 MED ORDER — SALINE SPRAY 0.65 % NA SOLN: 1.0000 | NASAL | 11 refills | Status: DC | PRN

## 2022-03-19 MED ORDER — METRONIDAZOLE 500 MG PO TABS: 500.0000 mg | ORAL_TABLET | Freq: Two times a day (BID) | ORAL | 0 refills | Status: DC

## 2022-03-19 NOTE — Progress Notes (Signed)
Chief Complaint  Patient presents with   Follow-up    F/u with fasting labs, pt is fasting    F/u  1. Vit d low will see if needed  2. Leukopenia seen heme years ago nothing to do rechedk  3. Wants refills penlac and flagyl for BV   Review of Systems  Constitutional:  Negative for weight loss.  HENT:  Negative for hearing loss.   Eyes:  Negative for blurred vision.  Respiratory:  Negative for shortness of breath.   Cardiovascular:  Negative for chest pain.  Gastrointestinal:  Negative for abdominal pain and blood in stool.  Genitourinary:  Negative for dysuria.  Musculoskeletal:  Negative for falls and joint pain.  Skin:  Negative for rash.  Neurological:  Negative for headaches.  Psychiatric/Behavioral:  Negative for depression.    Past Medical History:  Diagnosis Date   Anemia    iron infusions in past   Arthritis    right knee (also torn meniscus)   Childhood asthma    COVID-19    11/30/20   Eczema    Iritis    Followed by Dr. Joya San   Motion sickness    back seat cars   Prediabetes    Radius fracture    right [facility] Dr. Peggye Ley   Right wrist fracture    Tear of meniscus of right knee    Urinary tract bacterial infections    Past Surgical History:  Procedure Laterality Date   BREAST BIOPSY  1988   CATARACT EXTRACTION W/ INTRAOCULAR LENS IMPLANT Left    b/l eye surgery for cataracts b/l right eye blurry after surgery    COLONOSCOPY WITH PROPOFOL N/A 03/05/2017   Procedure: COLONOSCOPY WITH PROPOFOL;  Surgeon: Lucilla Lame, MD;  Location: [facility];  Service: Endoscopy;  Laterality: N/A;   DILATION AND CURETTAGE OF UTERUS  1979   VAGINAL DELIVERY     2   Family History  Problem Relation Age of Onset   Cancer Mother        uterine ca dx 32 now 40 as of 03/19/22   Clotting disorder Mother    Arthritis Mother    Heart disease Mother    Bipolar disorder Brother    Stroke Maternal Grandmother    Asthma Son    Alcohol  abuse Maternal Aunt    Cancer Maternal Aunt        lung   Social History   Socioeconomic History   Marital status: Single    Spouse name: Not on file   Number of children: 2   Years of education: Not on file   Highest education level: Not on file  Occupational History   Occupation: Mail carrier  Tobacco Use   Smoking status: Never   Smokeless tobacco: Never  Vaping Use   Vaping Use: Never used  Substance and Sexual Activity   Alcohol use: Yes    Alcohol/week: 3.0 standard drinks of alcohol    Types: 3 Glasses of wine per week    Comment: Wine 3 reg pour/week   Drug use: No   Sexual activity: Not on file  Other Topics Concern   Not on file  Social History Narrative   Lives in [place] with sons, from [place].      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   brimonidine (ALPHAGAN) 0.2 % ophthalmic solution Apply to eye.   Cholecalciferol 1.25 MG (50000 UT) capsule Take 1 capsule (50,000 Units total) by mouth once a week. D3 stop other vitamin D   dorzolamide (TRUSOPT) 2 % ophthalmic solution Apply to eye.   DUREZOL 0.05 % EMUL PLACE 1 DROP b/l bid   ferrous sulfate 325 (65 FE) MG tablet Take 325 mg by mouth daily with breakfast.   hydrocortisone 2.5 % cream Apply topically 2 (two) times daily. Face and neck   lidocaine (XYLOCAINE) 2 % solution Use as directed 15 mLs in the mouth or throat every 3 (three) hours as needed for mouth pain (gargle; may spit or swallow).    metroNIDAZOLE (FLAGYL) 500 MG tablet Take 1 tablet (500 mg total) by mouth 2 (two) times daily. With food   Multiple Vitamin (MULTIVITAMIN) tablet Take 1 tablet by mouth daily.   triamcinolone (KENALOG) 0.1 % Apply 1 application topically 2 (two) times daily. As needed   TURMERIC PO Take by mouth.   vitamin B-12 (CYANOCOBALAMIN) 1000 MCG tablet Take 1 tablet (1,000 mcg total) by mouth daily.   [DISCONTINUED] albuterol (VENTOLIN HFA) 108 (90 Base) MCG/ACT inhaler INHALE 2 PUFFS BY MOUTH EVERY 4 HOURS AS NEEDED   [DISCONTINUED] cetirizine (ZYRTEC) 10 MG tablet Take 1 tablet (10 mg total) by mouth daily as needed.   [DISCONTINUED] ciclopirox (PENLAC) 8 % solution Apply topically at bedtime. Apply over nail and surrounding skin. Apply daily over previous coat. After seven (7) days, may remove with alcohol and continue cycle.   [DISCONTINUED] fluticasone (FLONASE) 50 MCG/ACT nasal spray Place 2 sprays into both nostrils daily.   [DISCONTINUED] fluticasone furoate-vilanterol (BREO ELLIPTA) 100-25 MCG/ACT AEPB Inhale 1 puff into the lungs daily.   [DISCONTINUED] meloxicam (MOBIC) 15 MG tablet meloxicam 15 mg tablet  TAKE 1 TABLET(S)daily prn DO NOT TAKE WITH IBUPROFEN   [DISCONTINUED] predniSONE (DELTASONE) 10 MG tablet Take 1 tablet (10 mg total) by mouth daily with breakfast.   [DISCONTINUED] sodium chloride (OCEAN) 0.65 % SOLN nasal spray Place 1 spray into both nostrils as needed for congestion.   Allergies  Allergen Reactions   Other Hives    Allergic to tree nuts, but not to peanuts   Penicillins Hives   Tree Extract Hives   Amoxicillin Rash   Recent Results (from the past 2160 hour(s))  POCT rapid strep A     Status: Abnormal   Collection Time: 02/26/22 10:42 AM  Result Value Ref Range   Rapid Strep A Screen Positive (A) Negative  POC COVID-19     Status: None   Collection Time: 02/26/22 10:43 AM  Result Value Ref Range   SARS Coronavirus 2 Ag Negative Negative   Objective  Body mass  index is 39.58 kg/m. Wt Readings from Last 3 Encounters:  03/19/22 245 lb 3.2 oz (111.2 kg)  02/26/22 241 lb 9.6 oz (109.6 kg)  10/04/21 244 lb (110.7 kg)   Temp Readings from Last 3 Encounters:  03/19/22 97.8 F (36.6 C) (Oral)  02/26/22 99  F (37.2 C) (Oral)  07/02/21 (!) 97 F (36.1 C) (Temporal)   BP Readings from Last 3 Encounters:  03/19/22 122/80  02/26/22 134/80  07/02/21 124/80   Pulse Readings from Last 3 Encounters:  03/19/22 65  02/26/22 83  07/02/21 76    Physical Exam Vitals and nursing note reviewed.  Constitutional:      Appearance: Normal appearance. She is well-developed and well-groomed.  HENT:     Head: Normocephalic and atraumatic.  Eyes:     Conjunctiva/sclera: Conjunctivae normal.     Pupils: Pupils are equal, round, and reactive to light.  Cardiovascular:     Rate and Rhythm: Normal rate and regular rhythm.     Heart sounds: Normal heart sounds. No murmur heard. Pulmonary:     Effort: Pulmonary effort is normal.     Breath sounds: Normal breath sounds.  Abdominal:     General: Abdomen is flat. Bowel sounds are normal.     Tenderness: There is no abdominal tenderness.  Musculoskeletal:        General: No tenderness.  Skin:    General: Skin is warm and dry.  Neurological:     General: No focal deficit present.     Mental Status: She is alert and oriented to person, place, and time. Mental status is at baseline.     Cranial Nerves: Cranial nerves 2-12 are intact.     Motor: Motor function is intact.     Coordination: Coordination is intact.     Gait: Gait is intact.  Psychiatric:        Attention and Perception: Attention and perception normal.        Mood and Affect: Mood and affect normal.        Speech: Speech normal.        Behavior: Behavior normal. Behavior is cooperative.        Thought Content: Thought content normal.        Cognition and Memory: Cognition and memory normal.        Judgment: Judgment normal.     Assessment   Plan  Leukopenia, unspecified type - Plan: Comprehensive metabolic panel, CBC with Differential/Platelet, Pathologist smear review  Vitamin D deficiency - Plan: Vitamin D (25 hydroxy)  Onychomycosis - Plan: ciclopirox (PENLAC) 8 % solution  Prediabetes - Plan: Hemoglobin A1c  Fatigue, unspecified type - Plan: Comprehensive metabolic panel, CBC with Differential/Platelet  Iron deficiency - Plan: IBC + Ferritin  Bacterial vaginitis - Plan: metroNIDAZOLE (FLAGYL) 500 MG tablet  Mild intermittent asthma without complication - Plan: fluticasone furoate-vilanterol (BREO ELLIPTA) 100-25 MCG/ACT AEPB, albuterol (VENTOLIN HFA) 108 (90 Base) MCG/ACT inhaler  Chronic allergic rhinitis due to pollen - Plan: fluticasone (FLONASE) 50 MCG/ACT nasal spray, sodium chloride (OCEAN) 0.65 % SOLN nasal spray, cetirizine (ZYRTEC) 10 MG tablet  Arthritis - Plan: meloxicam (MOBIC) 15 MG tablet   HM flu shot consider in the future  utd Tdap immune mmr Not immune hep B status consider new vx in future  Consider shingrix and covid vaccine in future for now declines covid    Pap 06/27/20 neg pap neg hpv +BV 06/2020 will treat due 06/28/23   Mammogram 10/10/21 negative [facility] f/u in 1 year ordered   Colonoscopy 03/05/17 [facility] repeat in 10 years Dr. Allen Norris   rec healthy diet and exercise    Provider: Dr. Olivia Mackie McLean-Scocuzza-[facility]

## 2022-03-19 NOTE — Patient Instructions (Addendum)
Colonoscopy 03/05/17 due in 10 years Dr. Servando Snare Pap smear 06/28/23  Tdap due 12/03/2024  Consider  prevnar 20 vaccines in the future as well as flu vaccine   Dr. Clent Ridges or Dr. Lorin Picket   Pneumococcal Conjugate Vaccine (Prevnar 20) Suspension for Injection What is this medication? PNEUMOCOCCAL VACCINE (NEU mo KOK al vak SEEN) is a vaccine. It prevents pneumococcus bacterial infections. These bacteria can cause serious infections like pneumonia, meningitis, and blood infections. This vaccine will not treat an infection and will not cause infection. This vaccine is recommended for adults 18 years and older. This medicine may be used for other purposes; ask your health care provider or pharmacist if you have questions. COMMON BRAND NAME(S): Prevnar 20 What should I tell my care team before I take this medication? They need to know if you have any of these conditions: bleeding disorder fever immune system problems an unusual or allergic reaction to pneumococcal vaccine, diphtheria toxoid, other vaccines, other medicines, foods, dyes, or preservatives pregnant or trying to get pregnant breast-feeding How should I use this medication? This vaccine is injected into a muscle. It is given by a health care provider. A copy of Vaccine Information Statements will be given before each vaccination. Be sure to read this information carefully each time. This sheet may change often. Talk to your health care provider about the use of this medicine in children. Special care may be needed. Overdosage: If you think you have taken too much of this medicine contact a poison control center or emergency room at once. NOTE: This medicine is only for you. Do not share this medicine with others. What if I miss a dose? This does not apply. This medicine is not for regular use. What may interact with this medication? medicines for cancer chemotherapy medicines that suppress your immune function steroid medicines like  prednisone or cortisone This list may not describe all possible interactions. Give your health care provider a list of all the medicines, herbs, non-prescription drugs, or dietary supplements you use. Also tell them if you smoke, drink alcohol, or use illegal drugs. Some items may interact with your medicine. What should I watch for while using this medication? Mild fever and pain should go away in 3 days or less. Report any unusual symptoms to your health care provider. What side effects may I notice from receiving this medication? Side effects that you should report to your doctor or health care professional as soon as possible: allergic reactions (skin rash, itching or hives; swelling of the face, lips, or tongue) confusion fast, irregular heartbeat fever over 102 degrees F muscle weakness seizures trouble breathing unusual bruising or bleeding Side effects that usually do not require medical attention (report to your doctor or health care professional if they continue or are bothersome): fever of 102 degrees F or less headache joint pain muscle cramps, pain pain, tender at site where injected This list may not describe all possible side effects. Call your doctor for medical advice about side effects. You may report side effects to FDA at 1-800-FDA-1088. Where should I keep my medication? This vaccine is only given by a health care provider. It will not be stored at home. NOTE: This sheet is a summary. It may not cover all possible information. If you have questions about this medicine, talk to your doctor, pharmacist, or health care provider.  2023 Elsevier/Gold Standard (2020-03-09 00:00:00)

## 2022-03-20 LAB — PATHOLOGIST SMEAR REVIEW

## 2022-03-25 ENCOUNTER — Telehealth: Payer: Self-pay | Admitting: Internal Medicine

## 2022-03-25 NOTE — Telephone Encounter (Signed)
Already spoke to pt and mailed labs

## 2022-03-25 NOTE — Telephone Encounter (Signed)
Patient called and lab results were read. Patient has some questions about labs and would like to have a copy mailed to her. She does not use MyChart.

## 2022-05-19 ENCOUNTER — Encounter (INDEPENDENT_AMBULATORY_CARE_PROVIDER_SITE_OTHER): Payer: Self-pay

## 2022-05-21 DIAGNOSIS — H4043X1 Glaucoma secondary to eye inflammation, bilateral, mild stage: Secondary | ICD-10-CM | POA: Diagnosis not present

## 2022-05-21 DIAGNOSIS — H02402 Unspecified ptosis of left eyelid: Secondary | ICD-10-CM | POA: Diagnosis not present

## 2022-05-21 DIAGNOSIS — H1131 Conjunctival hemorrhage, right eye: Secondary | ICD-10-CM | POA: Diagnosis not present

## 2022-05-21 DIAGNOSIS — H11133 Conjunctival pigmentations, bilateral: Secondary | ICD-10-CM | POA: Diagnosis not present

## 2022-06-11 ENCOUNTER — Encounter: Payer: Federal, State, Local not specified - PPO | Admitting: Family Medicine

## 2022-06-25 ENCOUNTER — Other Ambulatory Visit: Payer: Federal, State, Local not specified - PPO

## 2022-06-26 ENCOUNTER — Other Ambulatory Visit: Payer: Federal, State, Local not specified - PPO

## 2022-06-27 ENCOUNTER — Other Ambulatory Visit: Payer: Federal, State, Local not specified - PPO

## 2022-07-01 ENCOUNTER — Other Ambulatory Visit (INDEPENDENT_AMBULATORY_CARE_PROVIDER_SITE_OTHER): Payer: Self-pay

## 2022-07-01 DIAGNOSIS — Z1389 Encounter for screening for other disorder: Secondary | ICD-10-CM

## 2022-07-01 DIAGNOSIS — D72819 Decreased white blood cell count, unspecified: Secondary | ICD-10-CM

## 2022-07-01 DIAGNOSIS — E559 Vitamin D deficiency, unspecified: Secondary | ICD-10-CM

## 2022-07-01 DIAGNOSIS — Z0000 Encounter for general adult medical examination without abnormal findings: Secondary | ICD-10-CM

## 2022-07-01 DIAGNOSIS — R7303 Prediabetes: Secondary | ICD-10-CM | POA: Diagnosis not present

## 2022-07-01 DIAGNOSIS — Z1329 Encounter for screening for other suspected endocrine disorder: Secondary | ICD-10-CM

## 2022-07-01 LAB — COMPREHENSIVE METABOLIC PANEL
ALT: 12 U/L (ref 0–35)
AST: 17 U/L (ref 0–37)
Albumin: 4 g/dL (ref 3.5–5.2)
Alkaline Phosphatase: 78 U/L (ref 39–117)
BUN: 20 mg/dL (ref 6–23)
CO2: 28 mEq/L (ref 19–32)
Calcium: 9.1 mg/dL (ref 8.4–10.5)
Chloride: 105 mEq/L (ref 96–112)
Creatinine, Ser: 0.76 mg/dL (ref 0.40–1.20)
GFR: 87.12 mL/min (ref >60.00)
Glucose, Bld: 92 mg/dL (ref 70–99)
Potassium: 4.1 mEq/L (ref 3.5–5.1)
Sodium: 140 mEq/L (ref 135–145)
Total Bilirubin: 0.4 mg/dL (ref 0.2–1.2)
Total Protein: 7.5 g/dL (ref 6.0–8.3)

## 2022-07-01 LAB — CBC WITH DIFFERENTIAL/PLATELET
Basophils Absolute: 0 10*3/uL (ref 0.0–0.1)
Basophils Relative: 0.8 % (ref 0.0–3.0)
Eosinophils Absolute: 0.1 10*3/uL (ref 0.0–0.7)
Eosinophils Relative: 2.4 % (ref 0.0–5.0)
HCT: 36.1 % (ref 36.0–46.0)
Hemoglobin: 11.9 g/dL — ABNORMAL LOW (ref 12.0–15.0)
Lymphocytes Relative: 39.3 % (ref 12.0–46.0)
Lymphs Abs: 1.3 10*3/uL (ref 0.7–4.0)
MCHC: 32.8 g/dL (ref 30.0–36.0)
MCV: 82.2 fl (ref 78.0–100.0)
Monocytes Absolute: 0.3 10*3/uL (ref 0.1–1.0)
Monocytes Relative: 8.7 % (ref 3.0–12.0)
Neutro Abs: 1.7 10*3/uL (ref 1.4–7.7)
Neutrophils Relative %: 48.8 % (ref 43.0–77.0)
Platelets: 240 10*3/uL (ref 150.0–400.0)
RBC: 4.4 Mil/uL (ref 3.87–5.11)
RDW: 14.2 % (ref 11.5–15.5)
WBC: 3.4 10*3/uL — ABNORMAL LOW (ref 4.0–10.5)

## 2022-07-01 LAB — LIPID PANEL
Cholesterol: 155 mg/dL (ref 0–200)
HDL: 61.6 mg/dL (ref >39.00)
LDL Cholesterol: 83 mg/dL (ref 0–99)
NonHDL: 93.27
Total CHOL/HDL Ratio: 3
Triglycerides: 51 mg/dL (ref 0.0–149.0)
VLDL: 10.2 mg/dL (ref 0.0–40.0)

## 2022-07-01 LAB — TSH: TSH: 1.32 u[IU]/mL (ref 0.35–5.50)

## 2022-07-01 LAB — VITAMIN D 25 HYDROXY (VIT D DEFICIENCY, FRACTURES): VITD: 29.11 ng/mL — ABNORMAL LOW (ref 30.00–100.00)

## 2022-07-01 LAB — HEMOGLOBIN A1C: Hgb A1c MFr Bld: 6 % (ref 4.6–6.5)

## 2022-07-02 LAB — URINALYSIS, ROUTINE W REFLEX MICROSCOPIC
Bacteria, UA: NONE SEEN /HPF
Bilirubin Urine: NEGATIVE
Glucose, UA: NEGATIVE
Hyaline Cast: NONE SEEN /LPF
Ketones, ur: NEGATIVE
Leukocytes,Ua: NEGATIVE
Nitrite: NEGATIVE
Protein, ur: NEGATIVE
Specific Gravity, Urine: 1.022 (ref 1.001–1.035)
pH: 6.5 (ref 5.0–8.0)

## 2022-07-02 LAB — MICROSCOPIC MESSAGE

## 2022-07-03 ENCOUNTER — Ambulatory Visit (INDEPENDENT_AMBULATORY_CARE_PROVIDER_SITE_OTHER): Payer: Federal, State, Local not specified - PPO | Admitting: Family Medicine

## 2022-07-03 ENCOUNTER — Encounter: Payer: Federal, State, Local not specified - PPO | Admitting: Internal Medicine

## 2022-07-03 ENCOUNTER — Telehealth: Payer: Self-pay | Admitting: Internal Medicine

## 2022-07-03 VITALS — BP 120/80 | HR 80 | Temp 98.5°F | Ht 66.0 in | Wt 240.6 lb

## 2022-07-03 DIAGNOSIS — Z1231 Encounter for screening mammogram for malignant neoplasm of breast: Secondary | ICD-10-CM

## 2022-07-03 DIAGNOSIS — R7303 Prediabetes: Secondary | ICD-10-CM

## 2022-07-03 DIAGNOSIS — Z6830 Body mass index (BMI) 30.0-30.9, adult: Secondary | ICD-10-CM

## 2022-07-03 DIAGNOSIS — N760 Acute vaginitis: Secondary | ICD-10-CM

## 2022-07-03 DIAGNOSIS — E559 Vitamin D deficiency, unspecified: Secondary | ICD-10-CM

## 2022-07-03 DIAGNOSIS — R3129 Other microscopic hematuria: Secondary | ICD-10-CM

## 2022-07-03 DIAGNOSIS — Z0000 Encounter for general adult medical examination without abnormal findings: Secondary | ICD-10-CM

## 2022-07-03 DIAGNOSIS — E538 Deficiency of other specified B group vitamins: Secondary | ICD-10-CM

## 2022-07-03 DIAGNOSIS — Z23 Encounter for immunization: Secondary | ICD-10-CM

## 2022-07-03 DIAGNOSIS — D72819 Decreased white blood cell count, unspecified: Secondary | ICD-10-CM

## 2022-07-03 DIAGNOSIS — Z1329 Encounter for screening for other suspected endocrine disorder: Secondary | ICD-10-CM

## 2022-07-03 DIAGNOSIS — B9689 Other specified bacterial agents as the cause of diseases classified elsewhere: Secondary | ICD-10-CM

## 2022-07-03 DIAGNOSIS — Z780 Asymptomatic menopausal state: Secondary | ICD-10-CM

## 2022-07-03 MED ORDER — VITAMIN D (ERGOCALCIFEROL) 1.25 MG (50000 UNIT) PO CAPS: 50000.0000 [IU] | ORAL_CAPSULE | ORAL | 0 refills | Status: DC

## 2022-07-03 NOTE — Telephone Encounter (Signed)
Pt want the bone density and mammogram to go to [facility] and not [place]

## 2022-07-03 NOTE — Progress Notes (Signed)
SUBJECTIVE:   Chief Complaint  Patient presents with   Annual Exam   HPI Patient presents to clinic for annual physical.  No acute concerns.  She is requesting refill on her Flagyl as she was never able to pick up the medication from her last visit with previous PCP.   PERTINENT PMH / PSH: Chronic leukopenia and IDA Obesity class II Vitamin D deficiency Vitamin B12 deficiency Microscopic hematuria Nephrolithiasis   OBJECTIVE:  BP 120/80   Pulse 80   Temp 98.5 F (36.9 C) (Oral)   Ht 5\' 6"  (1.676 m)   Wt 240 lb 9.6 oz (109.1 kg)   SpO2 99%   BMI 38.83 kg/m    Physical Exam Vitals reviewed.  Constitutional:      General: She is not in acute distress.    Appearance: She is not ill-appearing.  HENT:     Head: Normocephalic.     Right Ear: Tympanic membrane, ear canal and external ear normal.     Left Ear: Tympanic membrane, ear canal and external ear normal.     Nose: Nose normal.     Mouth/Throat:     Mouth: Mucous membranes are moist.  Eyes:     Extraocular Movements: Extraocular movements intact.     Conjunctiva/sclera: Conjunctivae normal.     Pupils: Pupils are equal, round, and reactive to light.  Neck:     Vascular: No carotid bruit.  Cardiovascular:     Rate and Rhythm: Normal rate and regular rhythm.     Pulses: Normal pulses.     Heart sounds: Normal heart sounds.  Pulmonary:     Effort: Pulmonary effort is normal.     Breath sounds: Normal breath sounds.  Abdominal:     General: Bowel sounds are normal. There is no distension.     Palpations: Abdomen is soft.     Tenderness: There is no abdominal tenderness. There is no right CVA tenderness, left CVA tenderness, guarding or rebound.  Musculoskeletal:        General: Normal range of motion.     Cervical back: Normal range of motion.     Right lower leg: No edema.     Left lower leg: No edema.  Lymphadenopathy:     Cervical: No cervical adenopathy.  Skin:    General: Skin is warm and  dry.     Capillary Refill: Capillary refill takes less than 2 seconds.  Neurological:     General: No focal deficit present.     Mental Status: She is alert and oriented to person, place, and time. Mental status is at baseline.     Motor: No weakness.  Psychiatric:        Mood and Affect: Mood normal.        Behavior: Behavior normal.        Thought Content: Thought content normal.        Judgment: Judgment normal.     ASSESSMENT/PLAN:  Annual physical exam Assessment & Plan: 56 year old female with no concerns today. Blood pressure well-controlled A1c and lipid screening completed Urine notable for microscopic hematuria.  Asymptomatic.  Repeat at next visit Chronic neutropenia, recommend patient to discuss with PCP at next visit. Vitamin D low.  Start vitamin D supplements x 12 weeks.  Repeat labs after treatment. Mammogram up-to-date.  Due 2024 DEXA scan ordered Colonoscopy up-to-date Pap due.  Patient aware to schedule appointment Follow-up in 1 year.   Need for immunization against influenza -  Flu Vaccine QUAD 63mo+IM (Fluarix, Fluzone & Alfiuria Quad PF)  Vitamin D deficiency -     VITAMIN D 25 Hydroxy (Vit-D Deficiency, Fractures); Future -     Vitamin D (Ergocalciferol); Take 1 capsule (50,000 Units total) by mouth every 7 (seven) days.  Dispense: 12 capsule; Refill: 0  Microscopic hematuria -     Urinalysis, Routine w reflex microscopic; Future  Breast cancer screening by mammogram -Refer for mammogram  Postmenopausal estrogen deficiency -Refer for DEXA scan  Bacterial vaginitis Assessment & Plan: Diagnosed in 06/2020.  Discussed with patient cannot treat for diagnosis 2 years ago.  If she was having any symptoms we could retest.  Patient denied any symptoms currently.  Okay with decision not to continue treatment.   Leukopenia, unspecified type -     CBC with Differential/Platelet; Future  Prediabetes -     Comprehensive metabolic panel; Future -      Hemoglobin A1c; Future  B12 deficiency -     Vitamin B12; Future  Thyroid disorder screen -     TSH; Future  BMI 30.0-30.9,adult -     Lipid panel; Future   PDMP reviewed  Patient needs [facility] appointment scheduled  Return in about 1 year (around 07/04/2023) for annual visit with fasting labs 1 week prior.  Dana Allan, MD

## 2022-07-03 NOTE — Patient Instructions (Addendum)
It was a pleasure meeting you today. Thank you for allowing me to take part in your health care.  Our goals for today as we discussed include:  Start Vitamin D 1.25 mg weekly for 3 months.  Will repeat labs then.  Repeat urine in 3 months  Schedule appointment for labs in 3 months for above  Referral sent for Mammogram and Bone scan. Please call to schedule appointment. [facility] [place] [address] [place], [place] [address] [phone]   Colonoscopy is due in 2028  PAP is due on 06/2023, please schedule appointment for this.  If you have any questions or concerns, please do not hesitate to call the office at [phone].  I look forward to our next visit and until then take care and stay safe.  Regards,   Dana Allan, MD   [facility]

## 2022-07-07 ENCOUNTER — Encounter: Payer: Self-pay | Admitting: Family Medicine

## 2022-07-07 ENCOUNTER — Other Ambulatory Visit: Payer: Self-pay

## 2022-07-07 DIAGNOSIS — Z1231 Encounter for screening mammogram for malignant neoplasm of breast: Secondary | ICD-10-CM

## 2022-07-07 DIAGNOSIS — Z13820 Encounter for screening for osteoporosis: Secondary | ICD-10-CM

## 2022-07-07 NOTE — Telephone Encounter (Signed)
Send referral to [facility]

## 2022-07-07 NOTE — Telephone Encounter (Signed)
noted 

## 2022-07-10 DIAGNOSIS — H35373 Puckering of macula, bilateral: Secondary | ICD-10-CM | POA: Diagnosis not present

## 2022-07-10 DIAGNOSIS — H35353 Cystoid macular degeneration, bilateral: Secondary | ICD-10-CM | POA: Diagnosis not present

## 2022-07-10 DIAGNOSIS — H2013 Chronic iridocyclitis, bilateral: Secondary | ICD-10-CM | POA: Diagnosis not present

## 2022-07-10 DIAGNOSIS — H21543 Posterior synechiae (iris), bilateral: Secondary | ICD-10-CM | POA: Diagnosis not present

## 2022-07-21 ENCOUNTER — Encounter: Payer: Self-pay | Admitting: Family Medicine

## 2022-07-21 DIAGNOSIS — Z1231 Encounter for screening mammogram for malignant neoplasm of breast: Secondary | ICD-10-CM | POA: Insufficient documentation

## 2022-07-21 DIAGNOSIS — R3129 Other microscopic hematuria: Secondary | ICD-10-CM | POA: Insufficient documentation

## 2022-07-21 DIAGNOSIS — Z23 Encounter for immunization: Secondary | ICD-10-CM | POA: Insufficient documentation

## 2022-07-21 DIAGNOSIS — Z780 Asymptomatic menopausal state: Secondary | ICD-10-CM | POA: Insufficient documentation

## 2022-07-21 NOTE — Assessment & Plan Note (Signed)
Diagnosed in 06/2020.  Discussed with patient cannot treat for diagnosis 2 years ago.  If she was having any symptoms we could retest.  Patient denied any symptoms currently.  Okay with decision not to continue treatment.

## 2022-07-21 NOTE — Assessment & Plan Note (Addendum)
57 year old female with no concerns today. Blood pressure well-controlled A1c and lipid screening completed Urine notable for microscopic hematuria.  Asymptomatic.  Repeat at next visit Chronic neutropenia, recommend patient to discuss with PCP at next visit. Vitamin D low.  Start vitamin D supplements x 12 weeks.  Repeat labs after treatment. Mammogram up-to-date.  Due 2024 DEXA scan ordered Colonoscopy up-to-date Pap due.  Patient aware to schedule appointment Follow-up in 1 year.

## 2022-10-15 DIAGNOSIS — Z1231 Encounter for screening mammogram for malignant neoplasm of breast: Secondary | ICD-10-CM | POA: Diagnosis not present

## 2022-10-15 LAB — HM MAMMOGRAPHY

## 2022-11-19 DIAGNOSIS — H11133 Conjunctival pigmentations, bilateral: Secondary | ICD-10-CM | POA: Diagnosis not present

## 2022-11-19 DIAGNOSIS — H4043X1 Glaucoma secondary to eye inflammation, bilateral, mild stage: Secondary | ICD-10-CM | POA: Diagnosis not present

## 2022-11-19 DIAGNOSIS — H209 Unspecified iridocyclitis: Secondary | ICD-10-CM | POA: Diagnosis not present

## 2022-11-19 DIAGNOSIS — H02402 Unspecified ptosis of left eyelid: Secondary | ICD-10-CM | POA: Diagnosis not present

## 2022-11-24 ENCOUNTER — Encounter: Payer: Self-pay | Admitting: Family Medicine

## 2023-01-29 DIAGNOSIS — H21543 Posterior synechiae (iris), bilateral: Secondary | ICD-10-CM | POA: Diagnosis not present

## 2023-01-29 DIAGNOSIS — H2013 Chronic iridocyclitis, bilateral: Secondary | ICD-10-CM | POA: Diagnosis not present

## 2023-01-29 DIAGNOSIS — H35373 Puckering of macula, bilateral: Secondary | ICD-10-CM | POA: Diagnosis not present

## 2023-01-29 DIAGNOSIS — H35353 Cystoid macular degeneration, bilateral: Secondary | ICD-10-CM | POA: Diagnosis not present

## 2023-04-15 ENCOUNTER — Ambulatory Visit
Admission: RE | Admit: 2023-04-15 | Discharge: 2023-04-15 | Disposition: A | Discharge status: Home / Self Care | Payer: Federal, State, Local not specified - PPO | Source: Ambulatory Visit | Attending: Emergency Medicine | Admitting: Emergency Medicine

## 2023-04-15 VITALS — BP 135/84 | HR 70 | Temp 98.3°F | Resp 16 | Ht 66.0 in | Wt 228.0 lb

## 2023-04-15 DIAGNOSIS — L03012 Cellulitis of left finger: Secondary | ICD-10-CM | POA: Diagnosis not present

## 2023-04-15 MED ORDER — DOXYCYCLINE HYCLATE 100 MG PO CAPS: 100.0000 mg | ORAL_CAPSULE | Freq: Two times a day (BID) | ORAL | 0 refills | Status: DC

## 2023-04-15 NOTE — ED Provider Notes (Signed)
[facility]    CSN: [id number] Arrival date & time: 04/15/23  1417      History   Chief Complaint Chief Complaint  Patient presents with   Finger Pain    Appt    HPI Hannah Robinson is a 57 y.o. female.   HPI  57 year old female with a past medical history significant for eczema, iritis, IDA, and asthma presents for evaluation of redness and swelling to the cuticle of her left middle finger.  She reports that the symptoms began a week ago but intensified 2 days ago.  She denies any drainage or fever.  She states that she does frequently bite her nails.  Past Medical History:  Diagnosis Date   Anemia    iron infusions in past   Arthritis    right knee (also torn meniscus)   Childhood asthma    COVID-19    11/30/20   Eczema    Iritis    Followed by Dr. Marti Sleigh   Motion sickness    back seat cars   Prediabetes    Radius fracture    right 09/21/19 [facility] conservative tx Dr. Stephenie Acres   Right wrist fracture    Tear of meniscus of right knee    Urinary tract bacterial infections     Patient Active Problem List   Diagnosis Date Noted   Microscopic hematuria 07/21/2022   Breast cancer screening by mammogram 07/21/2022   Postmenopausal estrogen deficiency 07/21/2022   Need for immunization against influenza 07/21/2022   Strep pharyngitis 02/26/2022   Prediabetes 06/27/2020   B12 deficiency 10/17/2019   Positive ANA (antinuclear antibody) 10/17/2019   Neutropenia (HCC) 10/17/2019   Thrombophlebitis of superficial veins of right lower extremity 10/17/2019   Kidney stone 05/06/2019   Hiatal hernia 05/06/2019   Arthritis of back 05/06/2019   Gallstones 05/06/2019   Iron deficiency 05/04/2019   Bacterial vaginitis 05/04/2019   Annual physical exam 02/05/2018   Intertrigo 02/05/2018   Leukopenia 12/27/2017   Special screening for malignant neoplasms, colon    High risk medication use 07/29/2016   Acute bronchitis 06/18/2016   Skin lesion 12/07/2015    Right knee pain 10/12/2015   BMI 30.0-30.9,adult 12/04/2014   Candidal skin infection 12/04/2014   Candidiasis of skin and nail 12/04/2014   Recurrent UTI 10/13/2013   Routine general medical examination at a health care facility 09/02/2013   Dyshidrotic eczema 09/02/2013   Eczema 07/26/2013   Dysuria 08/09/2012   Vitamin D deficiency 08/09/2012   Iron deficiency anemia 03/01/2012   Screening for skin cancer 03/01/2012   Iritis 03/01/2012   Asthma 08/25/2011   Rhinitis, allergic 08/25/2011   Reflux 08/25/2011   Gastro-esophageal reflux disease without esophagitis 08/25/2011    Past Surgical History:  Procedure Laterality Date   BREAST BIOPSY  1988   CATARACT EXTRACTION W/ INTRAOCULAR LENS IMPLANT Left    b/l eye surgery for cataracts b/l right eye blurry after surgery    COLONOSCOPY WITH PROPOFOL N/A 03/05/2017   Procedure: COLONOSCOPY WITH PROPOFOL;  Surgeon: Midge Minium, MD;  Location: [facility];  Service: Endoscopy;  Laterality: N/A;   DILATION AND CURETTAGE OF UTERUS  1979   VAGINAL DELIVERY     2    OB History   No obstetric history on file.      Home Medications    Prior to Admission medications   Medication Sig Start Date End Date Taking? Authorizing Provider  brimonidine (ALPHAGAN) 0.2 % ophthalmic solution 1 drop  2 (two) times daily. 05/04/22  Yes [provider]  ciclopirox (PENLAC) 8 % solution Apply topically at bedtime. Apply over nail and surrounding skin. Apply daily over previous coat. After seven (7) days, may remove with alcohol and continue cycle. 03/19/22  Yes McLean-Scocuzza, Pasty Spillers, MD  dorzolamide (TRUSOPT) 2 % ophthalmic solution 1 drop 2 (two) times daily. 06/14/22  Yes [provider]  doxycycline (VIBRAMYCIN) 100 MG capsule Take 1 capsule (100 mg total) by mouth 2 (two) times daily for 7 days. 04/15/23 04/22/23 Yes Becky Augusta, NP  DUREZOL 0.05 % EMUL PLACE 1 DROP b/l bid 10/31/16  Yes [provider]   fluticasone furoate-vilanterol (BREO ELLIPTA) 100-25 MCG/ACT AEPB Inhale 1 puff into the lungs daily. 03/19/22  Yes McLean-Scocuzza, Pasty Spillers, MD  meloxicam (MOBIC) 15 MG tablet meloxicam 15 mg tablet  TAKE 1 TABLET(S)daily prn DO NOT TAKE WITH IBUPROFEN 03/19/22  Yes McLean-Scocuzza, Pasty Spillers, MD  Multiple Vitamin (MULTIVITAMIN) tablet Take 1 tablet by mouth daily.   Yes [provider]  vitamin B-12 (CYANOCOBALAMIN) 1000 MCG tablet Take 1 tablet (1,000 mcg total) by mouth daily. 06/07/19  Yes Rickard Patience, MD  Vitamin D, Ergocalciferol, (DRISDOL) 1.25 MG (50000 UNIT) CAPS capsule Take 1 capsule (50,000 Units total) by mouth every 7 (seven) days. 07/03/22  Yes Dana Allan, MD  norethindrone (MICRONOR,CAMILA,ERRIN) 0.35 MG tablet Take 1 tablet by mouth daily.  10/08/11  [provider]    Family History Family History  Problem Relation Age of Onset   Cancer Mother        uterine ca dx 27 now 3 as of 03/19/22   Clotting disorder Mother    Arthritis Mother    Heart disease Mother    Bipolar disorder Brother    Stroke Maternal Grandmother    Asthma Son    Alcohol abuse Maternal Aunt    Cancer Maternal Aunt        lung    Social History Social History   Tobacco Use   Smoking status: Never   Smokeless tobacco: Never  Vaping Use   Vaping status: Never Used  Substance Use Topics   Alcohol use: Yes    Alcohol/week: 3.0 standard drinks of alcohol    Types: 3 Glasses of wine per week    Comment: Wine 3 reg pour/week   Drug use: No     Allergies   Other, Penicillins, Tree extract, and Amoxicillin   Review of Systems Review of Systems  Constitutional:  Negative for fever.  Skin:  Positive for color change.     Physical Exam Triage Vital Signs ED Triage Vitals  Encounter Vitals Group     BP      Systolic BP Percentile      Diastolic BP Percentile      Pulse      Resp      Temp      Temp src      SpO2      Weight      Height      Head Circumference       Peak Flow      Pain Score      Pain Loc      Pain Education      Exclude from Growth Chart    No data found.  Updated Vital Signs BP 135/84 (BP Location: Left Arm)   Pulse 70   Temp 98.3 F (36.8 C) (Oral)   Resp 16   Ht 5'  6" (1.676 m)   Wt 228 lb (103.4 kg)   SpO2 98%   BMI 36.80 kg/m   Visual Acuity Right Eye Distance:   Left Eye Distance:   Bilateral Distance:    Right Eye Near:   Left Eye Near:    Bilateral Near:     Physical Exam Vitals and nursing note reviewed.  Constitutional:      Appearance: Normal appearance. She is not ill-appearing.  HENT:     Head: Normocephalic and atraumatic.  Skin:    General: Skin is warm and dry.     Capillary Refill: Capillary refill takes less than 2 seconds.     Findings: Erythema present.  Neurological:     General: No focal deficit present.     Mental Status: She is alert and oriented to person, place, and time.      UC Treatments / Results  Labs (all labs ordered are listed, but only abnormal results are displayed) Labs Reviewed - No data to display  EKG   Radiology No results found.  Procedures Procedures (including critical care time)  Medications Ordered in UC Medications - No data to display  Initial Impression / Assessment and Plan / UC Course  I have reviewed the triage vital signs and the nursing notes.  Pertinent labs & imaging results that were available during my care of the patient were reviewed by me and considered in my medical decision making (see chart for details).   Patient is a pleasant, nontoxic-appearing 57 year old female presenting for evaluation of redness, swelling, and tenderness to the left middle finger around the cuticle.  As you can see in image above, there is erythema to the distal phalanx that is more prominent on the lateral aspect.  The area is indurated and tender but no fluctuance.  The deep yellow portion near the cuticle is callus formation and not a pus pocket.  The  patient has an allergy to penicillins so I will discharge her home on doxycycline 100 mg twice daily for 7 days.  She should take this with food.  She should also soak her finger in warm water and Epsom salts twice daily to help facilitate drainage.  If the redness increases, swelling increases, she develops red streaks up her finger, or fever she is to return for reevaluation.  Final Clinical Impressions(s) / UC Diagnoses   Final diagnoses:  Paronychia of left middle finger     Discharge Instructions      Take the doxycycline 100 mg twice daily with food for 7 days.  Soak your finger in warm water and Epsom salts 2-3 times a day to help facilitate drainage.  Keep a dressing on your finger until the drainage has stopped.  You can use over-the-counter Tylenol and ibuprofen according to the package instructions as needed for pain.  Return for reevaluation if you develop any increased redness, swelling, drainage, or red streaks going up your finger, or fever.      ED Prescriptions     Medication Sig Dispense Auth. Provider   doxycycline (VIBRAMYCIN) 100 MG capsule Take 1 capsule (100 mg total) by mouth 2 (two) times daily for 7 days. 14 capsule Becky Augusta, NP      PDMP not reviewed this encounter.   Becky Augusta, NP 04/15/23 1504

## 2023-04-15 NOTE — Discharge Instructions (Addendum)
Take the doxycycline 100 mg twice daily with food for 7 days.  Soak your finger in warm water and Epsom salts 2-3 times a day to help facilitate drainage.  Keep a dressing on your finger until the drainage has stopped.  You can use over-the-counter Tylenol and ibuprofen according to the package instructions as needed for pain.  Return for reevaluation if you develop any increased redness, swelling, drainage, or red streaks going up your finger, or fever.

## 2023-04-15 NOTE — ED Triage Notes (Signed)
Pt c/o infection in middle finger of L hand x1 wk. Finger red & swollen. States tends to bit nails frequently.

## 2023-04-20 ENCOUNTER — Telehealth: Payer: Self-pay | Admitting: Family Medicine

## 2023-04-20 MED ORDER — SULFAMETHOXAZOLE-TRIMETHOPRIM 800-160 MG PO TABS: 1.0000 | ORAL_TABLET | Freq: Two times a day (BID) | ORAL | 0 refills | Status: AC

## 2023-04-20 NOTE — Telephone Encounter (Signed)
Patient called stating that doxycycline was giving her hives.  Patient advised to start this medication.  She is still having some pus at the end of her finger tube.  Switch to Bactrim.  Katha Cabal, DO

## 2023-05-27 DIAGNOSIS — H11133 Conjunctival pigmentations, bilateral: Secondary | ICD-10-CM | POA: Diagnosis not present

## 2023-05-27 DIAGNOSIS — Z961 Presence of intraocular lens: Secondary | ICD-10-CM | POA: Diagnosis not present

## 2023-05-27 DIAGNOSIS — H4043X1 Glaucoma secondary to eye inflammation, bilateral, mild stage: Secondary | ICD-10-CM | POA: Diagnosis not present

## 2023-05-27 DIAGNOSIS — H02402 Unspecified ptosis of left eyelid: Secondary | ICD-10-CM | POA: Diagnosis not present

## 2023-05-29 DIAGNOSIS — S8001XA Contusion of right knee, initial encounter: Secondary | ICD-10-CM | POA: Diagnosis not present

## 2023-05-29 DIAGNOSIS — M1711 Unilateral primary osteoarthritis, right knee: Secondary | ICD-10-CM | POA: Diagnosis not present

## 2023-06-30 ENCOUNTER — Other Ambulatory Visit (INDEPENDENT_AMBULATORY_CARE_PROVIDER_SITE_OTHER): Payer: Federal, State, Local not specified - PPO

## 2023-06-30 DIAGNOSIS — D72819 Decreased white blood cell count, unspecified: Secondary | ICD-10-CM | POA: Diagnosis not present

## 2023-06-30 DIAGNOSIS — Z1329 Encounter for screening for other suspected endocrine disorder: Secondary | ICD-10-CM | POA: Diagnosis not present

## 2023-06-30 DIAGNOSIS — R7303 Prediabetes: Secondary | ICD-10-CM

## 2023-06-30 DIAGNOSIS — Z6830 Body mass index (BMI) 30.0-30.9, adult: Secondary | ICD-10-CM

## 2023-06-30 DIAGNOSIS — E538 Deficiency of other specified B group vitamins: Secondary | ICD-10-CM

## 2023-06-30 LAB — LIPID PANEL
Cholesterol: 155 mg/dL (ref 0–200)
HDL: 63.9 mg/dL (ref >39.00)
LDL Cholesterol: 83 mg/dL (ref 0–99)
NonHDL: 90.69
Total CHOL/HDL Ratio: 2
Triglycerides: 40 mg/dL (ref 0.0–149.0)
VLDL: 8 mg/dL (ref 0.0–40.0)

## 2023-06-30 LAB — COMPREHENSIVE METABOLIC PANEL
ALT: 16 U/L (ref 0–35)
AST: 21 U/L (ref 0–37)
Albumin: 3.8 g/dL (ref 3.5–5.2)
Alkaline Phosphatase: 74 U/L (ref 39–117)
BUN: 19 mg/dL (ref 6–23)
CO2: 29 meq/L (ref 19–32)
Calcium: 8.5 mg/dL (ref 8.4–10.5)
Chloride: 106 meq/L (ref 96–112)
Creatinine, Ser: 0.67 mg/dL (ref 0.40–1.20)
GFR: 96.5 mL/min (ref >60.00)
Glucose, Bld: 100 mg/dL — ABNORMAL HIGH (ref 70–99)
Potassium: 4 meq/L (ref 3.5–5.1)
Sodium: 141 meq/L (ref 135–145)
Total Bilirubin: 0.3 mg/dL (ref 0.2–1.2)
Total Protein: 7.4 g/dL (ref 6.0–8.3)

## 2023-06-30 LAB — CBC WITH DIFFERENTIAL/PLATELET
Basophils Absolute: 0 10*3/uL (ref 0.0–0.1)
Basophils Relative: 0.6 % (ref 0.0–3.0)
Eosinophils Absolute: 0.1 10*3/uL (ref 0.0–0.7)
Eosinophils Relative: 3.9 % (ref 0.0–5.0)
HCT: 35.2 % — ABNORMAL LOW (ref 36.0–46.0)
Hemoglobin: 11.3 g/dL — ABNORMAL LOW (ref 12.0–15.0)
Lymphocytes Relative: 45.6 % (ref 12.0–46.0)
Lymphs Abs: 1.4 10*3/uL (ref 0.7–4.0)
MCHC: 32.1 g/dL (ref 30.0–36.0)
MCV: 83.7 fL (ref 78.0–100.0)
Monocytes Absolute: 0.3 10*3/uL (ref 0.1–1.0)
Monocytes Relative: 10.3 % (ref 3.0–12.0)
Neutro Abs: 1.2 10*3/uL — ABNORMAL LOW (ref 1.4–7.7)
Neutrophils Relative %: 39.6 % — ABNORMAL LOW (ref 43.0–77.0)
Platelets: 253 10*3/uL (ref 150.0–400.0)
RBC: 4.21 Mil/uL (ref 3.87–5.11)
RDW: 15.3 % (ref 11.5–15.5)
WBC: 3 10*3/uL — ABNORMAL LOW (ref 4.0–10.5)

## 2023-06-30 LAB — TSH: TSH: 1.47 u[IU]/mL (ref 0.35–5.50)

## 2023-06-30 LAB — VITAMIN B12: Vitamin B-12: 587 pg/mL (ref 211–911)

## 2023-06-30 LAB — HEMOGLOBIN A1C: Hgb A1c MFr Bld: 5.9 % (ref 4.6–6.5)

## 2023-07-06 ENCOUNTER — Ambulatory Visit (INDEPENDENT_AMBULATORY_CARE_PROVIDER_SITE_OTHER): Payer: Federal, State, Local not specified - PPO | Admitting: Family Medicine

## 2023-07-06 ENCOUNTER — Encounter: Payer: Self-pay | Admitting: Family Medicine

## 2023-07-06 VITALS — BP 120/72 | HR 75 | Temp 98.0°F | Resp 18 | Ht 66.0 in | Wt 254.2 lb

## 2023-07-06 DIAGNOSIS — E559 Vitamin D deficiency, unspecified: Secondary | ICD-10-CM

## 2023-07-06 DIAGNOSIS — B351 Tinea unguium: Secondary | ICD-10-CM | POA: Diagnosis not present

## 2023-07-06 DIAGNOSIS — M479 Spondylosis, unspecified: Secondary | ICD-10-CM

## 2023-07-06 DIAGNOSIS — Z23 Encounter for immunization: Secondary | ICD-10-CM

## 2023-07-06 DIAGNOSIS — D649 Anemia, unspecified: Secondary | ICD-10-CM

## 2023-07-06 DIAGNOSIS — Z0000 Encounter for general adult medical examination without abnormal findings: Secondary | ICD-10-CM

## 2023-07-06 DIAGNOSIS — L304 Erythema intertrigo: Secondary | ICD-10-CM

## 2023-07-06 DIAGNOSIS — J45909 Unspecified asthma, uncomplicated: Secondary | ICD-10-CM

## 2023-07-06 DIAGNOSIS — Z1231 Encounter for screening mammogram for malignant neoplasm of breast: Secondary | ICD-10-CM

## 2023-07-06 LAB — IBC + FERRITIN
Ferritin: 125.3 ng/mL (ref 10.0–291.0)
Iron: 42 ug/dL (ref 42–145)
Saturation Ratios: 15.8 % — ABNORMAL LOW (ref 20.0–50.0)
TIBC: 266 ug/dL (ref 250.0–450.0)
Transferrin: 190 mg/dL — ABNORMAL LOW (ref 212.0–360.0)

## 2023-07-06 LAB — VITAMIN D 25 HYDROXY (VIT D DEFICIENCY, FRACTURES): VITD: 26.93 ng/mL — ABNORMAL LOW (ref 30.00–100.00)

## 2023-07-06 MED ORDER — CICLOPIROX 8 % EX SOLN: Freq: Every day | CUTANEOUS | 11 refills | Status: AC

## 2023-07-06 MED ORDER — NYSTATIN 100000 UNIT/GM EX POWD: 1.0000 | Freq: Three times a day (TID) | CUTANEOUS | 0 refills | Status: DC

## 2023-07-06 NOTE — Patient Instructions (Addendum)
It was a pleasure meeting you today. Thank you for allowing me to take part in your health care.  Our goals for today as we discussed include:  Refills sent for requested medication  Try Nystatin powder for under breast and crease at back  We will get some labs today.  If they are abnormal or we need to do something about them, I will call you.  If they are normal, I will send you a message on MyChart (if it is active) or a letter in the mail.  If you don't hear from [place] in 2 weeks, please call the office at the number below.   Will send in referral for 3D Mammogram at [facility]  Flu vaccine today  This is a list of the screening recommended for you and due dates:  Health Maintenance  Topic Date Due   COVID-19 Vaccine (1) Never done   Flu Shot  10/19/2023*   Mammogram  10/15/2023   DTaP/Tdap/Td vaccine (2 - Td or Tdap) 12/03/2024   Pap with HPV screening  06/27/2025   Colon Cancer Screening  03/06/2027   Hepatitis C Screening  Completed   HIV Screening  Completed   HPV Vaccine  Aged Out   Zoster (Shingles) Vaccine  Discontinued  *Topic was postponed. The date shown is not the original due date.    Follow up as needed  If you have any questions or concerns, please do not hesitate to call the office at [phone].  I look forward to our next visit and until then take care and stay safe.  Regards,   Dana Allan, MD   [facility]

## 2023-07-06 NOTE — Progress Notes (Unsigned)
SUBJECTIVE:   Chief Complaint  Patient presents with   Annual Exam   HPI Presents to clinic for annual visit  Discussed the use of AI scribe software for clinical note transcription with the patient, who gave verbal consent to proceed.  History of Present Illness The patient, a 57 year old with a history of asthma, presented for a routine physical examination. They reported a recent weight gain, attributing it to a decrease in physical activity due to a change in their work routine. They expressed a desire to address this issue and return to a healthier weight.  The patient also reported a recent skin infection in their finger, which was treated with doxycycline. However, they experienced an allergic reaction to the medication, presenting with itching and a rash. The infection has since resolved, but the patient noted residual tingling in the affected area.  The patient has been managing their asthma with as-needed use of Briolectin, which is prescribed by our office. They also reported intermittent use of Mobic, also prescribed by our office, for arthritis flare-ups. 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  The patient has a history of heavy menstrual bleeding, which was previously managed with Micronor, a birth control medication. However, they have not been on this medication for several years and reported no recent issues with vaginal bleeding.  The patient is currently taking several other medications, including eye drops, vitamin B12 supplements, and two medications prescribed by a specialist at [facility]. They also reported using a topical medication for their toes, prescribed by a podiatrist.  The patient expressed concern about their glucose levels, which were slightly elevated in recent lab results. However, they were relieved to learn that their kidney and liver functions were normal.  They also reported a long-standing issue with low hemoglobin levels, which a hematologist has determined to be chronic and not requiring treatment.  The patient has been proactive about their health, regularly getting mammograms and colonoscopies. They are due for a mammogram soon, which will be ordered by our office. They also expressed willingness to consider getting the flu shot, pneumonia vaccine, and shingles vaccine.  In summary, the patient presented for a routine physical with concerns about weight gain, a recent allergic reaction to doxycycline, and slightly elevated glucose levels. They are managing their asthma and arthritis with medication and are proactive about preventive health measures.    PERTINENT PMH / PSH: As above  OBJECTIVE:  BP 120/72   Pulse 75   Temp 98 F (36.7 C)   Resp 18   Ht 5\' 6"  (1.676 m)   Wt 254 lb 4 oz (115.3 kg)   SpO2 98%   BMI 41.04 kg/m    Physical Exam Vitals reviewed.  Constitutional:      General: She is not in acute distress.    Appearance: She is not ill-appearing.  HENT:     Head: Normocephalic.     Right Ear: Tympanic membrane, ear canal and external ear normal.     Left Ear: Tympanic membrane, ear canal and external ear normal.     Nose: Nose normal.     Mouth/Throat:     Mouth: Mucous membranes are moist.  Eyes:     Extraocular Movements: Extraocular movements intact.     Conjunctiva/sclera: Conjunctivae normal.     Pupils: Pupils are equal, round, and reactive to light.  Neck:     Thyroid: No thyromegaly or thyroid  tenderness.     Vascular: No carotid bruit.  Cardiovascular:     Rate and Rhythm: Normal rate and regular rhythm.     Pulses: Normal pulses.     Heart sounds: Normal heart sounds.  Pulmonary:     Effort: Pulmonary effort is normal.     Breath sounds: Normal breath sounds.  Abdominal:     General: Bowel sounds are normal. There is no distension.     Palpations: Abdomen is soft.     Tenderness: There is no  abdominal tenderness. There is no right CVA tenderness, left CVA tenderness, guarding or rebound.  Musculoskeletal:        General: Normal range of motion.     Cervical back: Normal range of motion.     Right lower leg: No edema.     Left lower leg: No edema.  Lymphadenopathy:     Cervical: No cervical adenopathy.  Skin:    Capillary Refill: Capillary refill takes less than 2 seconds.  Neurological:     General: No focal deficit present.     Mental Status: She is alert and oriented to person, place, and time. Mental status is at baseline.     Motor: No weakness.  Psychiatric:        Mood and Affect: Mood normal.        Behavior: Behavior normal.        Thought Content: Thought content normal.        Judgment: Judgment normal.        07/06/2023    1:03 PM 07/03/2022    4:00 PM 03/19/2022    8:13 AM 02/26/2022   10:38 AM 07/02/2021    9:22 AM  Depression screen PHQ 2/9  Decreased Interest 0 0 0 0 0  Down, Depressed, Hopeless 0 0 0 0 0  PHQ - 2 Score 0 0 0 0 0  Altered sleeping 0 0     Tired, decreased energy 1 0     Change in appetite 0 0     Feeling bad or failure about yourself  0 0     Trouble concentrating 0 0     Moving slowly or fidgety/restless 0 0     Suicidal thoughts 0 0     PHQ-9 Score 1 0     Difficult doing work/chores Not difficult at all Not difficult at all         07/06/2023    1:03 PM 07/03/2022    4:03 PM 02/26/2022   10:38 AM  GAD 7 : Generalized Anxiety Score  Nervous, Anxious, on Edge 0 0 0  Control/stop worrying 0 0 0  Worry too much - different things 0 0 0  Trouble relaxing 0 0 0  Restless 0 0 0  Easily annoyed or irritable 1 0 0  Afraid - awful might happen 0 0 0  Total GAD 7 Score 1 0 0  Anxiety Difficulty Not difficult at all Not difficult at all Not difficult at all    ASSESSMENT/PLAN:  Annual physical exam  Onychomycosis Assessment & Plan: Refill ciclopirox 8% solution  Orders: -     Ciclopirox; Apply topically at bedtime.  Apply over nail and surrounding skin. Apply daily over previous coat. After seven (7) days, may remove with alcohol and continue cycle.  Dispense: 6.6 mL; Refill: 11  Anemia, unspecified type Assessment & Plan: Check cbc and ferritin level  Orders: -     IBC + Ferritin  Vitamin D deficiency -  VITAMIN D 25 Hydroxy (Vit-D Deficiency, Fractures)  Pruritic intertrigo Assessment & Plan: Patient reports itching in the crease of her back and breasts. No visible rash. -Provide Nystatin powder to keep area dry and manage potential fungal infection.  Orders: -     Nystatin; Apply 1 Application topically 3 (three) times daily. Apply to affected area  Dispense: 15 g; Refill: 0  Uncomplicated asthma, unspecified asthma severity, unspecified whether persistent Assessment & Plan: Well controlled with as-needed use of Brio Ellipta. -Continue Brio Ellipta as needed.   Arthritis of back Assessment & Plan: Occasional flares, most recently due to a fall. No current pain. -Continue Mobic 15mg  as needed. If daily use becomes necessary, consider reducing dose to half a tablet due to potential side effects including increased blood pressure and gastrointestinal bleeding.   Need for influenza vaccination -     Flu vaccine trivalent PF, 6mos and older(Flulaval,Afluria,Fluarix,Fluzone)  Breast cancer screening by mammogram -     3D Screening Mammogram, Left and Right; Future   General Health Maintenance -Colonoscopy due 2028 -Order 3D mammogram at [facility]. -Consider Pneumovax 20 and Shingrix vaccines at age 43.    PDMP reviewed  Return if symptoms worsen or fail to improve, for PCP.  Dana Allan, MD

## 2023-07-09 ENCOUNTER — Encounter: Payer: Self-pay | Admitting: Family Medicine

## 2023-07-09 DIAGNOSIS — Z23 Encounter for immunization: Secondary | ICD-10-CM | POA: Insufficient documentation

## 2023-07-09 DIAGNOSIS — B351 Tinea unguium: Secondary | ICD-10-CM | POA: Insufficient documentation

## 2023-07-09 DIAGNOSIS — D649 Anemia, unspecified: Secondary | ICD-10-CM | POA: Insufficient documentation

## 2023-07-09 MED ORDER — VITAMIN D (ERGOCALCIFEROL) 1.25 MG (50000 UNIT) PO CAPS: 50000.0000 [IU] | ORAL_CAPSULE | ORAL | 1 refills | Status: DC

## 2023-07-09 NOTE — Assessment & Plan Note (Signed)
Refill ciclopirox 8% solution

## 2023-07-09 NOTE — Assessment & Plan Note (Signed)
Well controlled with as-needed use of Brio Ellipta. -Continue Brio Ellipta as needed.

## 2023-07-09 NOTE — Assessment & Plan Note (Signed)
Occasional flares, most recently due to a fall. No current pain. -Continue Mobic 15mg  as needed. If daily use becomes necessary, consider reducing dose to half a tablet due to potential side effects including increased blood pressure and gastrointestinal bleeding.

## 2023-07-09 NOTE — Assessment & Plan Note (Deleted)
Check cbc and ferritin level

## 2023-07-09 NOTE — Assessment & Plan Note (Signed)
Patient reports itching in the crease of her back and breasts. No visible rash. -Provide Nystatin powder to keep area dry and manage potential fungal infection.

## 2023-07-09 NOTE — Assessment & Plan Note (Signed)
Check cbc and ferritin level

## 2023-07-09 NOTE — Assessment & Plan Note (Deleted)
Chronic fatigue Check CBC and Ferritin  level

## 2023-08-06 DIAGNOSIS — H35373 Puckering of macula, bilateral: Secondary | ICD-10-CM | POA: Diagnosis not present

## 2023-08-06 DIAGNOSIS — H4043X1 Glaucoma secondary to eye inflammation, bilateral, mild stage: Secondary | ICD-10-CM | POA: Diagnosis not present

## 2023-08-06 DIAGNOSIS — H21543 Posterior synechiae (iris), bilateral: Secondary | ICD-10-CM | POA: Diagnosis not present

## 2023-08-06 DIAGNOSIS — H2013 Chronic iridocyclitis, bilateral: Secondary | ICD-10-CM | POA: Diagnosis not present

## 2023-08-25 ENCOUNTER — Ambulatory Visit
Admission: RE | Admit: 2023-08-25 | Discharge: 2023-08-25 | Disposition: A | Discharge status: Home / Self Care | Payer: Federal, State, Local not specified - PPO | Source: Ambulatory Visit | Attending: Emergency Medicine | Admitting: Emergency Medicine

## 2023-08-25 VITALS — BP 117/80 | HR 105 | Temp 100.5°F | Resp 18

## 2023-08-25 DIAGNOSIS — J09X2 Influenza due to identified novel influenza A virus with other respiratory manifestations: Secondary | ICD-10-CM | POA: Diagnosis not present

## 2023-08-25 LAB — RESP PANEL BY RT-PCR (FLU A&B, COVID) ARPGX2
Influenza A by PCR: POSITIVE — AB
Influenza B by PCR: NEGATIVE
SARS Coronavirus 2 by RT PCR: NEGATIVE

## 2023-08-25 LAB — GROUP A STREP BY PCR: Group A Strep by PCR: NOT DETECTED

## 2023-08-25 MED ORDER — ACETAMINOPHEN 325 MG PO TABS
975.0000 mg | ORAL_TABLET | Freq: Once | ORAL | Status: AC
  Administered 2023-08-25: 975 mg via ORAL

## 2023-08-25 MED ORDER — PROMETHAZINE-DM 6.25-15 MG/5ML PO SYRP
5.0000 mL | ORAL_SOLUTION | Freq: Four times a day (QID) | ORAL | 0 refills | Status: DC | PRN
Start: 2023-08-25 — End: 2023-12-23

## 2023-08-25 MED ORDER — OSELTAMIVIR PHOSPHATE 75 MG PO CAPS: 75.0000 mg | ORAL_CAPSULE | Freq: Two times a day (BID) | ORAL | 0 refills | Status: DC

## 2023-08-25 MED ORDER — IPRATROPIUM BROMIDE 0.06 % NA SOLN: 2.0000 | Freq: Four times a day (QID) | NASAL | 12 refills | Status: DC

## 2023-08-25 MED ORDER — BENZONATATE 100 MG PO CAPS: 200.0000 mg | ORAL_CAPSULE | Freq: Three times a day (TID) | ORAL | 0 refills | Status: DC

## 2023-08-25 NOTE — ED Provider Notes (Signed)
 [facility]    CSN: [id number] Arrival date & time: 08/25/23  1557      History   Chief Complaint Chief Complaint  Patient presents with   Cough    Headache a little nauseous - Entered by patient   Headache   Sore Throat    HPI Hannah Robinson is a 58 y.o. female.   HPI  58 year old female with past medical history significant for anemia, prediabetes, eczema, and iritis presents for evaluation of flulike symptoms that started 3 days ago and consist of subjective fever, runny nose nasal congestion, headache, sore throat, cough productive cough, wheezing, and nausea.  She denies ear pain, shortness of breath, vomiting or diarrhea, or bodyaches.  No known sick contacts.  Past Medical History:  Diagnosis Date   Anemia    iron infusions in past   Arthritis    right knee (also torn meniscus)   Childhood asthma    COVID-19    11/30/20   Eczema    Iritis    Followed by Dr. Newt   Motion sickness    back seat cars   Prediabetes    Radius fracture    right 09/21/19 [facility] conservative tx Dr. Francisco   Right wrist fracture    Tear of meniscus of right knee    Urinary tract bacterial infections     Patient Active Problem List   Diagnosis Date Noted   Onychomycosis 07/09/2023   Anemia 07/09/2023   Need for influenza vaccination 07/09/2023   Microscopic hematuria 07/21/2022   Breast cancer screening by mammogram 07/21/2022   Postmenopausal estrogen deficiency 07/21/2022   Need for immunization against influenza 07/21/2022   Strep pharyngitis 02/26/2022   Prediabetes 06/27/2020   B12 deficiency 10/17/2019   Positive ANA (antinuclear antibody) 10/17/2019   Neutropenia (HCC) 10/17/2019   Thrombophlebitis of superficial veins of right lower extremity 10/17/2019   Kidney stone 05/06/2019   Hiatal hernia 05/06/2019   Arthritis of back 05/06/2019   Gallstones 05/06/2019   Iron deficiency 05/04/2019   Bacterial vaginitis 05/04/2019   Annual physical  exam 02/05/2018   Pruritic intertrigo 02/05/2018   Leukopenia 12/27/2017   Special screening for malignant neoplasms, colon    High risk medication use 07/29/2016   Acute bronchitis 06/18/2016   Skin lesion 12/07/2015   Right knee pain 10/12/2015   BMI 30.0-30.9,adult 12/04/2014   Candidal skin infection 12/04/2014   Candidiasis of skin and nail 12/04/2014   Recurrent UTI 10/13/2013   Routine general medical examination at a health care facility 09/02/2013   Dyshidrotic eczema 09/02/2013   Eczema 07/26/2013   Dysuria 08/09/2012   Vitamin D  deficiency 08/09/2012   Iron deficiency anemia 03/01/2012   Screening for skin cancer 03/01/2012   Iritis 03/01/2012   Asthma 08/25/2011   Rhinitis, allergic 08/25/2011   Reflux 08/25/2011   Gastro-esophageal reflux disease without esophagitis 08/25/2011    Past Surgical History:  Procedure Laterality Date   BREAST BIOPSY  1988   CATARACT EXTRACTION W/ INTRAOCULAR LENS IMPLANT Left    b/l eye surgery for cataracts b/l right eye blurry after surgery    COLONOSCOPY WITH PROPOFOL  N/A 03/05/2017   Procedure: COLONOSCOPY WITH PROPOFOL ;  Surgeon: Jinny Carmine, MD;  Location: [facility];  Service: Endoscopy;  Laterality: N/A;   DILATION AND CURETTAGE OF UTERUS  1979   VAGINAL DELIVERY     2    OB History   No obstetric history on file.      Home  Medications    Prior to Admission medications   Medication Sig Start Date End Date Taking? Authorizing Provider  benzonatate  (TESSALON ) 100 MG capsule Take 2 capsules (200 mg total) by mouth every 8 (eight) hours. 08/25/23  Yes Bernardino Ditch, NP  ipratropium (ATROVENT ) 0.06 % nasal spray Place 2 sprays into both nostrils 4 (four) times daily. 08/25/23  Yes Bernardino Ditch, NP  oseltamivir  (TAMIFLU ) 75 MG capsule Take 1 capsule (75 mg total) by mouth every 12 (twelve) hours. 08/25/23  Yes Bernardino Ditch, NP  promethazine -dextromethorphan (PROMETHAZINE -DM) 6.25-15 MG/5ML syrup Take 5 mLs by mouth 4  (four) times daily as needed. 08/25/23  Yes Bernardino Ditch, NP  brimonidine (ALPHAGAN) 0.2 % ophthalmic solution 1 drop 2 (two) times daily. 05/04/22   [provider]  ciclopirox  (PENLAC ) 8 % solution Apply topically at bedtime. Apply over nail and surrounding skin. Apply daily over previous coat. After seven (7) days, may remove with alcohol and continue cycle. 07/06/23   Walsh, Tanya, MD  dorzolamide (TRUSOPT) 2 % ophthalmic solution 1 drop 2 (two) times daily. 06/14/22   [provider]  DUREZOL 0.05 % EMUL PLACE 1 DROP b/l bid 10/31/16   [provider]  fluticasone  furoate-vilanterol (BREO ELLIPTA ) 100-25 MCG/ACT AEPB Inhale 1 puff into the lungs daily. Patient not taking: Reported on 07/06/2023 03/19/22   McLean-Scocuzza, Randine SAILOR, MD  meloxicam  (MOBIC ) 15 MG tablet meloxicam  15 mg tablet  TAKE 1 TABLET(S)daily prn DO NOT TAKE WITH IBUPROFEN  03/19/22   McLean-Scocuzza, Randine SAILOR, MD  Multiple Vitamin (MULTIVITAMIN) tablet Take 1 tablet by mouth daily.    [provider]  nystatin  (MYCOSTATIN /NYSTOP ) powder Apply 1 Application topically 3 (three) times daily. Apply to affected area 07/06/23   Hope Merle, MD  Omega-3 Fatty Acids (FISH OIL) 300 MG CAPS Take 1 capsule by mouth daily.    [provider]  TURMERIC CURCUMIN PO Take 1 capsule by mouth daily.    [provider]  vitamin B-12 (CYANOCOBALAMIN ) 1000 MCG tablet Take 1 tablet (1,000 mcg total) by mouth daily. 06/07/19   Babara Call, MD  Vitamin D , Ergocalciferol , (DRISDOL ) 1.25 MG (50000 UNIT) CAPS capsule Take 1 capsule (50,000 Units total) by mouth every 7 (seven) days. 07/09/23   Walsh, Tanya, MD  norethindrone (MICRONOR,CAMILA,ERRIN) 0.35 MG tablet Take 1 tablet by mouth daily.  10/08/11  [provider]    Family History Family History  Problem Relation Age of Onset   Cancer Mother        uterine ca dx 39 now 44 as of 03/19/22   Clotting disorder Mother    Arthritis Mother     Heart disease Mother    Bipolar disorder Brother    Stroke Maternal Grandmother    Asthma Son    Alcohol abuse Maternal Aunt    Cancer Maternal Aunt        lung    Social History Social History   Tobacco Use   Smoking status: Never   Smokeless tobacco: Never  Vaping Use   Vaping status: Never Used  Substance Use Topics   Alcohol use: Yes    Alcohol/week: 3.0 standard drinks of alcohol    Types: 3 Glasses of wine per week    Comment: Wine 3 reg pour/week   Drug use: No     Allergies   Other, Penicillins, Tree extract, Amoxicillin, and Doxycycline    Review of Systems Review of Systems  Constitutional:  Positive for fever.  HENT:  Positive for congestion, rhinorrhea  and sore throat. Negative for ear pain.   Respiratory:  Positive for cough and wheezing. Negative for shortness of breath.   Gastrointestinal:  Positive for nausea. Negative for diarrhea and vomiting.  Musculoskeletal:  Negative for arthralgias and myalgias.  Neurological:  Positive for headaches.     Physical Exam Triage Vital Signs ED Triage Vitals  Encounter Vitals Group     BP 08/25/23 1623 117/80     Systolic BP Percentile --      Diastolic BP Percentile --      Pulse Rate 08/25/23 1623 (!) 105     Resp 08/25/23 1623 18     Temp 08/25/23 1623 (!) 100.5 F (38.1 C)     Temp Source 08/25/23 1623 Oral     SpO2 08/25/23 1623 95 %     Weight --      Height --      Head Circumference --      Peak Flow --      Pain Score 08/25/23 1622 4     Pain Loc --      Pain Education --      Exclude from Growth Chart --    No data found.  Updated Vital Signs BP 117/80 (BP Location: Left Arm)   Pulse (!) 105   Temp (!) 100.5 F (38.1 C) (Oral)   Resp 18   SpO2 95%   Visual Acuity Right Eye Distance:   Left Eye Distance:   Bilateral Distance:    Right Eye Near:   Left Eye Near:    Bilateral Near:     Physical Exam Vitals and nursing note reviewed.  Constitutional:      Appearance: Normal  appearance. She is not ill-appearing.  HENT:     Head: Normocephalic and atraumatic.     Right Ear: Tympanic membrane, ear canal and external ear normal. There is no impacted cerumen.     Left Ear: Tympanic membrane, ear canal and external ear normal. There is no impacted cerumen.     Nose: Congestion and rhinorrhea present.     Comments: Nasal mucosa is erythematous and edematous with scant clear discharge in both nares.    Mouth/Throat:     Mouth: Mucous membranes are moist.     Pharynx: Oropharynx is clear. Posterior oropharyngeal erythema present. No oropharyngeal exudate.     Comments: Tonsillar pillars are 1+ edematous and erythematous but free of exudate. Cardiovascular:     Rate and Rhythm: Normal rate and regular rhythm.     Pulses: Normal pulses.     Heart sounds: Normal heart sounds. No murmur heard.    No friction rub. No gallop.  Pulmonary:     Effort: Pulmonary effort is normal.     Breath sounds: Normal breath sounds. No wheezing, rhonchi or rales.  Musculoskeletal:     Cervical back: Normal range of motion and neck supple. No tenderness.  Lymphadenopathy:     Cervical: No cervical adenopathy.  Skin:    General: Skin is warm and dry.     Capillary Refill: Capillary refill takes less than 2 seconds.     Findings: No rash.  Neurological:     General: No focal deficit present.     Mental Status: She is alert and oriented to person, place, and time.      UC Treatments / Results  Labs (all labs ordered are listed, but only abnormal results are displayed) Labs Reviewed  RESP PANEL BY RT-PCR (FLU A&B, COVID) ARPGX2 -  Abnormal; Notable for the following components:      Result Value   Influenza A by PCR POSITIVE (*)    All other components within normal limits  GROUP A STREP BY PCR    EKG   Radiology No results found.  Procedures Procedures (including critical care time)  Medications Ordered in UC Medications  acetaminophen  (TYLENOL ) tablet 975 mg (975  mg Oral Given 08/25/23 1636)    Initial Impression / Assessment and Plan / UC Course  I have reviewed the triage vital signs and the nursing notes.  Pertinent labs & imaging results that were available during my care of the patient were reviewed by me and considered in my medical decision making (see chart for details).   Patient is a nontoxic-appearing 58 year old female presenting for evaluation of 3 days worth of flulike symptoms as outlined HPI above.  Her physical exam does reveal inflammation of her upper respiratory tract with clear rhinorrhea, edematous and erythematous tonsillar pillars, and clear postnasal drip.  Cardiopulmonary exam is benign.  Differential diagnosis include COVID, influenza, strep, and viral illness.  I will order PCR to evaluate for all 3 infections.  I will also order 975 mg Tylenol  for the patient's fever of 100.5.  Strep PCR is negative.  Respiratory panel is positive for influenza A.  I will discharge patient on the diagnosis of influenza A and start her on Tamiflu  75 mg twice daily for 5 days.  Atrovent  nasal spray for the nasal congestion and Tessalon  Perles) DM cough syrup for cough and congestion.  Over-the-counter Tylenol  as needed for fever or bodyaches.   Final Clinical Impressions(s) / UC Diagnoses   Final diagnoses:  Influenza due to identified novel influenza A virus with other respiratory manifestations     Discharge Instructions      Take the Tamiflu  twice daily for 5 days for treatment of influenza.  Use over-the-counter Tylenol  according to package instructions as needed for fever or bodyaches.  Use the Atrovent  nasal spray, 2 squirts up each nostril every 6 hours, as needed for nasal congestion and runny nose.  Use over-the-counter Delsym, Zarbee's, or Robitussin during the day as needed for cough.  Use the Tessalon  Perles every 8 hours as needed for cough.  Taken with a small sip of water .  You may experience some numbness to your  tongue or metallic taste in your mouth, this is normal.  Use the Promethazine  DM cough syrup at bedtime as will make you drowsy but it should help dry up your postnasal drip and aid you in sleep and cough relief.  Return for reevaluation, or see your primary care provider, for new or worsening symptoms.      ED Prescriptions     Medication Sig Dispense Auth. Provider   benzonatate  (TESSALON ) 100 MG capsule Take 2 capsules (200 mg total) by mouth every 8 (eight) hours. 21 capsule Bernardino Ditch, NP   ipratropium (ATROVENT ) 0.06 % nasal spray Place 2 sprays into both nostrils 4 (four) times daily. 15 mL Bernardino Ditch, NP   promethazine -dextromethorphan (PROMETHAZINE -DM) 6.25-15 MG/5ML syrup Take 5 mLs by mouth 4 (four) times daily as needed. 118 mL Bernardino Ditch, NP   oseltamivir  (TAMIFLU ) 75 MG capsule Take 1 capsule (75 mg total) by mouth every 12 (twelve) hours. 10 capsule Bernardino Ditch, NP      PDMP not reviewed this encounter.   Bernardino Ditch, NP 08/25/23 [phone]

## 2023-08-25 NOTE — ED Triage Notes (Signed)
Pt c/o sore throat, cough and headache x 3 days.

## 2023-08-25 NOTE — Discharge Instructions (Addendum)
 Take the Tamiflu  twice daily for 5 days for treatment of influenza.  Use over-the-counter Tylenol  according to package instructions as needed for fever or bodyaches.  Use the Atrovent  nasal spray, 2 squirts up each nostril every 6 hours, as needed for nasal congestion and runny nose.  Use over-the-counter Delsym, Zarbee's, or Robitussin during the day as needed for cough.  Use the Tessalon  Perles every 8 hours as needed for cough.  Taken with a small sip of water .  You may experience some numbness to your tongue or metallic taste in your mouth, this is normal.  Use the Promethazine  DM cough syrup at bedtime as will make you drowsy but it should help dry up your postnasal drip and aid you in sleep and cough relief.  Return for reevaluation, or see your primary care provider, for new or worsening symptoms.

## 2023-08-28 ENCOUNTER — Encounter: Payer: Self-pay | Admitting: Internal Medicine

## 2023-08-28 ENCOUNTER — Telehealth: Payer: Federal, State, Local not specified - PPO | Admitting: Internal Medicine

## 2023-08-28 DIAGNOSIS — J101 Influenza due to other identified influenza virus with other respiratory manifestations: Secondary | ICD-10-CM | POA: Diagnosis not present

## 2023-08-28 NOTE — Patient Instructions (Signed)
 Nice to meet you!   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

## 2023-08-28 NOTE — Progress Notes (Signed)
 Virtual Visit via Caregility   Note   This format is felt to be most appropriate for this patient at this time.  All issues noted in this document were discussed and addressed.  No physical exam was performed (except for noted visual exam findings with Video Visits).   I connected with San on 08/28/23 at 10:00 AM EST by a video enabled telemedicine application  and verified that I am speaking with the correct person using two identifiers. Location patient: home Location provider: work or home office Persons participating in the virtual visit: patient, provider  I discussed the limitations, risks, security and privacy concerns of performing an evaluation and management service by telephone and the availability of in person appointments. I also discussed with the patient that there may be a patient responsible charge related to this service. The patient expressed understanding and agreed to proceed.   Reason for visit: influenza  HPI:  58 yr old female with history of asthma diagnosed with influenza on Feb 4 after presenting wit 3 day h/o respiratory  symptoms.  She was prescribed tamiflu , tessalon  perles, phenergan  cough syrup and atrovent  nasal spray .  She states that her cough and fevers have improved but she remains fatigue, congested and dizzy.  She is supposed to return to work tomorrow but does not feel well enough     ROS: See pertinent positives and negatives per HPI.  Past Medical History:  Diagnosis Date   Anemia    iron infusions in past   Arthritis    right knee (also torn meniscus)   Childhood asthma    COVID-19    11/30/20   Eczema    Iritis    Followed by Dr. Newt   Motion sickness    back seat cars   Prediabetes    Radius fracture    right 09/21/19 [facility] conservative tx Dr. Francisco   Right wrist fracture    Tear of meniscus of right knee    Urinary tract bacterial infections     Past Surgical History:  Procedure Laterality Date   BREAST BIOPSY   1988   CATARACT EXTRACTION W/ INTRAOCULAR LENS IMPLANT Left    b/l eye surgery for cataracts b/l right eye blurry after surgery    COLONOSCOPY WITH PROPOFOL  N/A 03/05/2017   Procedure: COLONOSCOPY WITH PROPOFOL ;  Surgeon: Jinny Carmine, MD;  Location: [facility];  Service: Endoscopy;  Laterality: N/A;   DILATION AND CURETTAGE OF UTERUS  1979   VAGINAL DELIVERY     2    Family History  Problem Relation Age of Onset   Cancer Mother        uterine ca dx 66 now 64 as of 03/19/22   Clotting disorder Mother    Arthritis Mother    Heart disease Mother    Bipolar disorder Brother    Stroke Maternal Grandmother    Asthma Son    Alcohol abuse Maternal Aunt    Cancer Maternal Aunt        lung    SOCIAL HX: reports that she has never smoked. She has never used smokeless tobacco. She reports current alcohol use of about 3.0 standard drinks of alcohol per week. She reports that she does not use drugs.    Current Outpatient Medications:    benzonatate  (TESSALON ) 100 MG capsule, Take 2 capsules (200 mg total) by mouth every 8 (eight) hours., Disp: 21 capsule, Rfl: 0   brimonidine (ALPHAGAN) 0.2 % ophthalmic solution, 1 drop 2 (  two) times daily., Disp: , Rfl:    ciclopirox  (PENLAC ) 8 % solution, Apply topically at bedtime. Apply over nail and surrounding skin. Apply daily over previous coat. After seven (7) days, may remove with alcohol and continue cycle., Disp: 6.6 mL, Rfl: 11   dorzolamide (TRUSOPT) 2 % ophthalmic solution, 1 drop 2 (two) times daily., Disp: , Rfl:    fluticasone  furoate-vilanterol (BREO ELLIPTA ) 100-25 MCG/ACT AEPB, Inhale 1 puff into the lungs daily., Disp: 1 each, Rfl: 11   meloxicam  (MOBIC ) 15 MG tablet, meloxicam  15 mg tablet  TAKE 1 TABLET(S)daily prn DO NOT TAKE WITH IBUPROFEN , Disp: 90 tablet, Rfl: 1   Multiple Vitamin (MULTIVITAMIN) tablet, Take 1 tablet by mouth daily., Disp: , Rfl:    nystatin  (MYCOSTATIN /NYSTOP ) powder, Apply 1 Application topically 3 (three)  times daily. Apply to affected area, Disp: 15 g, Rfl: 0   Omega-3 Fatty Acids (FISH OIL) 300 MG CAPS, Take 1 capsule by mouth daily., Disp: , Rfl:    oseltamivir  (TAMIFLU ) 75 MG capsule, Take 1 capsule (75 mg total) by mouth every 12 (twelve) hours., Disp: 10 capsule, Rfl: 0   promethazine -dextromethorphan (PROMETHAZINE -DM) 6.25-15 MG/5ML syrup, Take 5 mLs by mouth 4 (four) times daily as needed., Disp: 118 mL, Rfl: 0   TURMERIC CURCUMIN PO, Take 1 capsule by mouth daily., Disp: , Rfl:    vitamin B-12 (CYANOCOBALAMIN ) 1000 MCG tablet, Take 1 tablet (1,000 mcg total) by mouth daily., Disp: 90 tablet, Rfl: 3   Vitamin D , Ergocalciferol , (DRISDOL ) 1.25 MG (50000 UNIT) CAPS capsule, Take 1 capsule (50,000 Units total) by mouth every 7 (seven) days., Disp: 12 capsule, Rfl: 1   DUREZOL 0.05 % EMUL, PLACE 1 DROP b/l bid, Disp: , Rfl: 6   ipratropium (ATROVENT ) 0.06 % nasal spray, Place 2 sprays into both nostrils 4 (four) times daily. (Patient not taking: Reported on 08/28/2023), Disp: 15 mL, Rfl: 12  EXAM:  VITALS per patient if applicable:  GENERAL: alert, oriented, appears acutely ill but in no acute distress  HEENT: atraumatic, conjunttiva clear, no obvious abnormalities on inspection of external nose and ears  NECK: normal movements of the head and neck  LUNGS: on inspection no signs of respiratory distress, breathing rate appears normal, no obvious gross SOB, gasping or wheezing  CV: no obvious cyanosis  MS: moves all visible extremities without noticeable abnormality  PSYCH/NEURO: pleasant and cooperative, no obvious depression or anxiety, speech and thought processing grossly intact  ASSESSMENT AND PLAN: Influenza A Assessment & Plan: She was diagnosed on Feb 4 and started Tamiflu . She is improving but continues to have congestion, cough, fatigue and dizziness.  Will extend her time out of work until Monday          I discussed the assessment and treatment plan with the patient.  The patient was provided an opportunity to ask questions and all were answered. The patient agreed with the plan and demonstrated an understanding of the instructions.   The patient was advised to call back or seek an in-person evaluation if the symptoms worsen or if the condition fails to improve as anticipated.   I spent 20 minutes dedicated to the care of this patient on the date of this encounter to include pre-visit review of her medical history,  Face-to-face time with the patient , and post visit ordering of testing and therapeutics.    Verneita LITTIE Kettering, MD

## 2023-08-29 NOTE — Assessment & Plan Note (Signed)
 She was diagnosed on Feb 4 and started Tamiflu . She is improving but continues to have congestion, cough, fatigue and dizziness.  Will extend her time out of work until Monday

## 2023-09-11 ENCOUNTER — Other Ambulatory Visit: Payer: Self-pay | Admitting: Family Medicine

## 2023-09-11 DIAGNOSIS — L304 Erythema intertrigo: Secondary | ICD-10-CM

## 2023-09-15 DIAGNOSIS — L249 Irritant contact dermatitis, unspecified cause: Secondary | ICD-10-CM | POA: Diagnosis not present

## 2023-09-15 DIAGNOSIS — L304 Erythema intertrigo: Secondary | ICD-10-CM | POA: Diagnosis not present

## 2023-09-15 DIAGNOSIS — R21 Rash and other nonspecific skin eruption: Secondary | ICD-10-CM | POA: Diagnosis not present

## 2023-09-15 DIAGNOSIS — I872 Venous insufficiency (chronic) (peripheral): Secondary | ICD-10-CM | POA: Diagnosis not present

## 2023-10-20 DIAGNOSIS — Z1231 Encounter for screening mammogram for malignant neoplasm of breast: Secondary | ICD-10-CM | POA: Diagnosis not present

## 2023-10-20 LAB — HM MAMMOGRAPHY

## 2023-10-29 ENCOUNTER — Telehealth: Payer: Self-pay

## 2023-10-29 NOTE — Telephone Encounter (Signed)
 I left a voicemail for patient letting her know that Dr. Dana Allan will be leaving our practice at the end of June.  I let her know that we will need to schedule a transfer of care appointment for her with one of our providers who are accepting new patients, if she is planning to continue her primary care with [place].  I let patient know that I will go ahead and cancel her appointments in December for her physical with Dr. Clent Ridges and the associated lab visit.  E2C2 - when patient calls back, please schedule a transfer of care appointment for her with one of our providers who are accepting new patients.

## 2023-10-29 NOTE — Telephone Encounter (Signed)
 I also sent a message to patient via MyChart.

## 2023-11-12 ENCOUNTER — Telehealth: Payer: Self-pay

## 2023-11-12 NOTE — Telephone Encounter (Signed)
 We received a notification letting us  know that patient has not read our MyChart message regarding Dr. Valli Gaw leaving.  I spoke with patient to give her the message we sent via MyChart.  Patient states she would like to have Dr. Creta Dolin.  Patient states she had a visit with her recently and was really impressed with how much she cares for her patients.  Patient states Dr. Madelon Scheuermann let her know that a certain nose spray can be addictive and patient was very appreciative that she shared this information with her.  I let patient know that Dr. Tullo is not currently accepting new patients.  Patient states she would like for me to please send her a message with the above information to see if she will be willing to accept her as a new patient.  I let patient know that I will send the message.

## 2023-11-17 NOTE — Telephone Encounter (Signed)
 Patient has been scheduled for a TOC appointment with Dr. Creta Dolin on 12/23/2023.

## 2023-11-17 NOTE — Telephone Encounter (Signed)
 noted

## 2023-12-09 DIAGNOSIS — H4043X1 Glaucoma secondary to eye inflammation, bilateral, mild stage: Secondary | ICD-10-CM | POA: Diagnosis not present

## 2023-12-23 ENCOUNTER — Encounter: Payer: Self-pay | Admitting: Internal Medicine

## 2023-12-23 ENCOUNTER — Ambulatory Visit: Admitting: Internal Medicine

## 2023-12-23 VITALS — BP 136/84 | HR 74 | Ht 66.0 in | Wt 257.4 lb

## 2023-12-23 DIAGNOSIS — E538 Deficiency of other specified B group vitamins: Secondary | ICD-10-CM

## 2023-12-23 DIAGNOSIS — R7303 Prediabetes: Secondary | ICD-10-CM

## 2023-12-23 DIAGNOSIS — Z1231 Encounter for screening mammogram for malignant neoplasm of breast: Secondary | ICD-10-CM

## 2023-12-23 DIAGNOSIS — K449 Diaphragmatic hernia without obstruction or gangrene: Secondary | ICD-10-CM

## 2023-12-23 DIAGNOSIS — J4520 Mild intermittent asthma, uncomplicated: Secondary | ICD-10-CM | POA: Diagnosis not present

## 2023-12-23 DIAGNOSIS — L2082 Flexural eczema: Secondary | ICD-10-CM

## 2023-12-23 DIAGNOSIS — R030 Elevated blood-pressure reading, without diagnosis of hypertension: Secondary | ICD-10-CM

## 2023-12-23 DIAGNOSIS — E559 Vitamin D deficiency, unspecified: Secondary | ICD-10-CM

## 2023-12-23 DIAGNOSIS — K219 Gastro-esophageal reflux disease without esophagitis: Secondary | ICD-10-CM

## 2023-12-23 DIAGNOSIS — N200 Calculus of kidney: Secondary | ICD-10-CM

## 2023-12-23 DIAGNOSIS — D649 Anemia, unspecified: Secondary | ICD-10-CM

## 2023-12-23 DIAGNOSIS — Z124 Encounter for screening for malignant neoplasm of cervix: Secondary | ICD-10-CM

## 2023-12-23 DIAGNOSIS — D709 Neutropenia, unspecified: Secondary | ICD-10-CM

## 2023-12-23 DIAGNOSIS — E785 Hyperlipidemia, unspecified: Secondary | ICD-10-CM

## 2023-12-23 MED ORDER — AIRSUPRA 90-80 MCG/ACT IN AERO: 1.0000 | INHALATION_SPRAY | Freq: Four times a day (QID) | RESPIRATORY_TRACT | 11 refills | Status: DC | PRN

## 2023-12-23 MED ORDER — EPINEPHRINE 0.3 MG/0.3ML IJ SOAJ: 0.3000 mg | INTRAMUSCULAR | 1 refills | Status: AC | PRN

## 2023-12-23 NOTE — Progress Notes (Unsigned)
 Subjective:  Patient ID: Hannah Robinson, female    DOB: [birth year]  Age: 58 y.o. MRN: [id number]  CC: The primary encounter diagnosis was Encounter for screening mammogram for malignant neoplasm of breast. Diagnoses of Anemia, unspecified type, Prediabetes, Vitamin D  deficiency, Mild intermittent asthma without complication, Elevated blood pressure reading in office without diagnosis of hypertension, Hyperlipidemia LDL goal <100, Cervical cancer screening, B12 deficiency, Flexural eczema, Neutropenia, unspecified type (HCC), Bilateral nephrolithiasis, Hiatal hernia, Morbid obesity (HCC), and Gastro-esophageal reflux disease without esophagitis were also pertinent to this visit.   HPI Hannah Robinson presents for No chief complaint on file.   58 yr old female with history of asthma ,  eczema , obesity  BMI > 40, intertrigo, glaucoma, uveitis .   Anemia/leukopenia: chronic.  History of IDA requiring transfusions   History of intermittent asthma:  uses Breo only at the onset of a respiratory infection and prn albuterol    History of GERD  noted by ENT  during evaluation for laryngitis .  Symptoms  have been quiescent but were present last week on a daily basis, used famotidine for a few days,  better today .   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  Managed with triamcinolone  by [facility]  WANTS TO HAVE A GYNECOLOGIST IN [place]  FEMALE PREFERRED NOT [place].  LAST PAP DONE HERE     Outpatient Medications Prior to Visit  Medication Sig Dispense Refill   brimonidine (ALPHAGAN) 0.2 % ophthalmic solution 1 drop 2 (two) times daily.     dorzolamide (TRUSOPT) 2 % ophthalmic solution 1 drop 2 (two) times daily.     DUREZOL 0.05 % EMUL PLACE 1 DROP b/l bid  6   meloxicam  (MOBIC ) 15 MG tablet  meloxicam  15 mg tablet  TAKE 1 TABLET(S)daily prn DO NOT TAKE WITH IBUPROFEN  90 tablet 1   Multiple Vitamin (MULTIVITAMIN) tablet Take 1 tablet by mouth daily.     Omega-3 Fatty Acids (FISH OIL) 300 MG CAPS Take 1 capsule by mouth daily.     TURMERIC CURCUMIN PO Take 1 capsule by mouth daily.     ciclopirox  (PENLAC ) 8 % solution Apply topically at bedtime. Apply over nail and surrounding skin. Apply daily over previous coat. After seven (7) days, may remove with alcohol and continue cycle. (Patient not taking: Reported on 12/23/2023) 6.6 mL 11   fluticasone  furoate-vilanterol (BREO ELLIPTA ) 100-25 MCG/ACT AEPB Inhale 1 puff into the lungs daily. (Patient not taking: Reported on 12/23/2023) 1 each 11   nystatin  (MYCOSTATIN /NYSTOP ) powder APPLY 1 APPLICATION TOPICALLY 3 (THREE) TIMES DAILY. APPLY TO AFFECTED AREA (Patient not taking: Reported on 12/23/2023) 15 g 0   benzonatate  (TESSALON ) 100 MG capsule Take 2 capsules (200 mg total) by mouth every 8 (eight) hours. (Patient not taking: Reported on 12/23/2023) 21 capsule 0   ipratropium (ATROVENT ) 0.06 % nasal spray Place 2 sprays into both nostrils 4 (four) times daily. (Patient not taking: Reported on 12/23/2023) 15 mL 12   oseltamivir  (TAMIFLU ) 75 MG capsule Take 1 capsule (75 mg total) by mouth every 12 (twelve) hours. (Patient not taking: Reported on 12/23/2023) 10 capsule 0   promethazine -dextromethorphan (PROMETHAZINE -DM) 6.25-15 MG/5ML syrup Take 5 mLs by mouth 4 (four) times daily as needed. (Patient not taking: Reported on 12/23/2023) 118 mL 0  vitamin B-12 (CYANOCOBALAMIN ) 1000 MCG tablet Take 1 tablet (1,000 mcg total) by mouth daily. (Patient not taking: Reported on 12/23/2023) 90 tablet 3   Vitamin D , Ergocalciferol , (DRISDOL ) 1.25 MG (50000 UNIT) CAPS capsule Take 1 capsule (50,000 Units total) by mouth every 7 (seven) days. (Patient not taking: Reported on 12/23/2023) 12 capsule 1   No facility-administered medications prior to visit.    Review of  Systems;  Patient denies headache, fevers, malaise, unintentional weight loss, skin rash, eye pain, sinus congestion and sinus pain, sore throat, dysphagia,  hemoptysis , cough, dyspnea, wheezing, chest pain, palpitations, orthopnea, edema, abdominal pain, nausea, melena, diarrhea, constipation, flank pain, dysuria, hematuria, urinary  Frequency, nocturia, numbness, tingling, seizures,  Focal weakness, Loss of consciousness,  Tremor, insomnia, depression, anxiety, and suicidal ideation.      Objective:  BP 136/84   Pulse 74   Ht 5\' 6"  (1.676 m)   Wt 257 lb 6.4 oz (116.8 kg)   SpO2 95%   BMI 41.55 kg/m   BP Readings from Last 3 Encounters:  12/23/23 136/84  08/25/23 117/80  07/06/23 120/72    Wt Readings from Last 3 Encounters:  12/23/23 257 lb 6.4 oz (116.8 kg)  07/06/23 254 lb 4 oz (115.3 kg)  04/15/23 228 lb (103.4 kg)    Physical Exam  Lab Results  Component Value Date   HGBA1C 5.9 12/23/2023   HGBA1C 5.9 06/30/2023   HGBA1C 6.0 07/01/2022    Lab Results  Component Value Date   CREATININE 0.71 12/23/2023   CREATININE 0.67 06/30/2023   CREATININE 0.76 07/01/2022    Lab Results  Component Value Date   WBC 3.7 (L) 12/23/2023   HGB 12.3 12/23/2023   HCT 37.7 12/23/2023   PLT 243.0 12/23/2023   GLUCOSE 87 12/23/2023   CHOL 155 06/30/2023   TRIG 40.0 06/30/2023   HDL 63.90 06/30/2023   LDLCALC 83 06/30/2023   ALT 14 12/23/2023   AST 18 12/23/2023   NA 141 12/23/2023   K 4.2 12/23/2023   CL 104 12/23/2023   CREATININE 0.71 12/23/2023   BUN 13 12/23/2023   CO2 26 12/23/2023   TSH 1.47 06/30/2023   HGBA1C 5.9 12/23/2023   MICROALBUR 8.0 (H) 12/23/2023    No results found.  Assessment & Plan:  .Encounter for screening mammogram for malignant neoplasm of breast -     3D Screening Mammogram, Left and Right; Future  Anemia, unspecified type -     Protein electrophoresis, serum -     IFE AND PE, RANDOM URINE -     B12 and Folate Panel -     IBC +  Ferritin -     CBC with Differential/Platelet -     CBC with Differential/Platelet; Future  Prediabetes -     Comprehensive metabolic panel with GFR -     Hemoglobin A1c -     Hemoglobin A1c; Future  Vitamin D  deficiency -     VITAMIN D  25 Hydroxy (Vit-D Deficiency, Fractures)  Mild intermittent asthma without complication Assessment & Plan: Refilling albuterol  , change to [employer].  Advised to continue maintenance inhaler    Elevated blood pressure reading in office without diagnosis of hypertension -     Microalbumin / creatinine urine ratio -     Comprehensive metabolic panel with GFR; Future  Hyperlipidemia LDL goal <100 -     Lipid panel; Future -     LDL cholesterol, direct; Future  Cervical cancer screening -  Ambulatory referral to Gynecology  B12 deficiency Assessment & Plan: Continue current supplementation  Lab Results  Component Value Date   VITAMINB12 489 12/23/2023      Flexural eczema Assessment & Plan: Involving flexural surfaces AND area under breasts.  Continue prn use of triamcinolone  prescribed by [facility] dermatology .  Advised to start a daily non sedating antihistamine given atopy and asthma    Neutropenia, unspecified type (HCC) Assessment & Plan: Previously attribute to B12 deficiency. She has no historyof recurrent bacterial infections.  Screening for MM given concurrent anemia/   Lab Results  Component Value Date   VITAMINB12 489 12/23/2023     Lab Results  Component Value Date   WBC 3.7 (L) 12/23/2023   HGB 12.3 12/23/2023   HCT 37.7 12/23/2023   MCV 81.2 12/23/2023   PLT 243.0 12/23/2023      Bilateral nephrolithiasis Assessment & Plan: Nonobstructing, noted on 2020 CT scan done to evaluate cause of hematuria.  Cystitis was also noted    Hiatal hernia Assessment & Plan: Incidental finding of a small HH on CT ab pelvis in 2020.     Morbid obesity (HCC) Assessment & Plan: I have addressed  BMI and recommended wt  loss of 10% of body weight over the next 6 months using a low fat, fruit/vegetable based [place] diet and regular exercise a minimum of 5 days per week.     Gastro-esophageal reflux disease without esophagitis Assessment & Plan: The risks of long term PPI use for acid suppression in patients without documented Barretts esophagus were discussed, including the possibility of osteoporosis, iron , B12 and magnesium deficiencies,  CKD, and dementia. Suggested continue use of famotidine  20 mg daily and if GERD symptoms return,  To resume daily PPI and  referral for EGD.     Other orders -     Airsupra; Inhale 1 puff into the lungs every 6 (six) hours as needed.  Dispense: 5.9 g; Refill: 11 -     EPINEPHrine; Inject 0.3 mg into the muscle as needed for anaphylaxis.  Dispense: 1 each; Refill: 1     I spent 44 minutes on the day of this face to face encounter reviewing patient's  most recent visit with hematology,  prior relevant surgical and non surgical procedures,   labs and imaging studies, counseling on weight management,  reviewing the assessment and plan with patient, and post visit ordering and reviewing of  diagnostics and therapeutics with patient  .   Follow-up: Return in about 6 months (around 06/23/2024) for physical.   Thersia Flax, MD

## 2023-12-23 NOTE — Patient Instructions (Addendum)
 I ENJOYED  OUR VISIT TODAY!  1) I RECOMMEND TAKING ALLEGRA (FEXOFENADINE ) 180 MG DAILY FOR YOUR ECZEMA AND ALLERGIES (AVAILABLE AT [facility] FOR LESS $$$)  2) REGARDING YOUR REFLUX    YOU CAN  TAKE GENERIC PEPCID (CALLED  famotidine)  20 mg once or twice daily  3) I WILL MAKE A REFERRAL TO A LOCAL FEMALE GYNECOLOGIST FOR YOUR NEXT PAP SMEAR (DUE IN 2026)  4) I have refilled you albuterol  with a new improved formula that contains steroid called air supra   5) YOUR BLOOD PRESSURE IS SLIGHTLY HIGH TODAY  It should be less than 130/80.  Please check your blood pressure a few times at home and send me the readings so I can determine if you need to MEDICATION.  If you would like to have fasting labs done prior to your CPE in December  you can set that up today

## 2023-12-24 LAB — MICROALBUMIN / CREATININE URINE RATIO
Creatinine,U: 153.9 mg/dL
Microalb Creat Ratio: 52.1 mg/g — ABNORMAL HIGH (ref 0.0–30.0)
Microalb, Ur: 8 mg/dL — ABNORMAL HIGH (ref 0.0–1.9)

## 2023-12-24 LAB — COMPREHENSIVE METABOLIC PANEL WITH GFR
ALT: 14 U/L (ref 0–35)
AST: 18 U/L (ref 0–37)
Albumin: 4.3 g/dL (ref 3.5–5.2)
Alkaline Phosphatase: 78 U/L (ref 39–117)
BUN: 13 mg/dL (ref 6–23)
CO2: 26 meq/L (ref 19–32)
Calcium: 9.6 mg/dL (ref 8.4–10.5)
Chloride: 104 meq/L (ref 96–112)
Creatinine, Ser: 0.71 mg/dL (ref 0.40–1.20)
GFR: 93.56 mL/min (ref >60.00)
Glucose, Bld: 87 mg/dL (ref 70–99)
Potassium: 4.2 meq/L (ref 3.5–5.1)
Sodium: 141 meq/L (ref 135–145)
Total Bilirubin: 0.4 mg/dL (ref 0.2–1.2)
Total Protein: 8.4 g/dL — ABNORMAL HIGH (ref 6.0–8.3)

## 2023-12-24 LAB — B12 AND FOLATE PANEL
Folate: 9.2 ng/mL (ref >5.9)
Vitamin B-12: 489 pg/mL (ref 211–911)

## 2023-12-24 LAB — VITAMIN D 25 HYDROXY (VIT D DEFICIENCY, FRACTURES): VITD: 26.22 ng/mL — ABNORMAL LOW (ref 30.00–100.00)

## 2023-12-24 LAB — CBC WITH DIFFERENTIAL/PLATELET
Basophils Absolute: 0 10*3/uL (ref 0.0–0.1)
Basophils Relative: 0.8 % (ref 0.0–3.0)
Eosinophils Absolute: 0.1 10*3/uL (ref 0.0–0.7)
Eosinophils Relative: 3.9 % (ref 0.0–5.0)
HCT: 37.7 % (ref 36.0–46.0)
Hemoglobin: 12.3 g/dL (ref 12.0–15.0)
Lymphocytes Relative: 49.5 % — ABNORMAL HIGH (ref 12.0–46.0)
Lymphs Abs: 1.8 10*3/uL (ref 0.7–4.0)
MCHC: 32.8 g/dL (ref 30.0–36.0)
MCV: 81.2 fl (ref 78.0–100.0)
Monocytes Absolute: 0.3 10*3/uL (ref 0.1–1.0)
Monocytes Relative: 7.7 % (ref 3.0–12.0)
Neutro Abs: 1.4 10*3/uL (ref 1.4–7.7)
Neutrophils Relative %: 38.1 % — ABNORMAL LOW (ref 43.0–77.0)
Platelets: 243 10*3/uL (ref 150.0–400.0)
RBC: 4.64 Mil/uL (ref 3.87–5.11)
RDW: 14.8 % (ref 11.5–15.5)
WBC: 3.7 10*3/uL — ABNORMAL LOW (ref 4.0–10.5)

## 2023-12-24 LAB — HEMOGLOBIN A1C: Hgb A1c MFr Bld: 5.9 % (ref 4.6–6.5)

## 2023-12-24 LAB — IBC + FERRITIN
Ferritin: 161.7 ng/mL (ref 10.0–291.0)
Iron: 51 ug/dL (ref 42–145)
Saturation Ratios: 18.1 % — ABNORMAL LOW (ref 20.0–50.0)
TIBC: 281.4 ug/dL (ref 250.0–450.0)
Transferrin: 201 mg/dL — ABNORMAL LOW (ref 212.0–360.0)

## 2023-12-25 LAB — PROTEIN ELECTROPHORESIS, SERUM
Albumin ELP: 4.1 g/dL (ref 3.8–4.8)
Alpha 1: 0.3 g/dL (ref 0.2–0.3)
Alpha 2: 0.7 g/dL (ref 0.5–0.9)
Beta 2: 0.9 g/dL — ABNORMAL HIGH (ref 0.2–0.5)
Beta Globulin: 0.5 g/dL (ref 0.4–0.6)
Gamma Globulin: 1.4 g/dL (ref 0.8–1.7)
Total Protein: 7.9 g/dL (ref 6.1–8.1)

## 2023-12-25 NOTE — Assessment & Plan Note (Addendum)
 Involving flexural surfaces AND area under breasts.  Continue prn use of triamcinolone  prescribed by [facility] dermatology .  Advised to start a daily non sedating antihistamine given atopy and asthma

## 2023-12-25 NOTE — Assessment & Plan Note (Signed)
 The risks of long term PPI use for acid suppression in patients without documented Barretts esophagus were discussed, including the possibility of osteoporosis, iron , B12 and magnesium deficiencies,  CKD, and dementia. Suggested continue use of famotidine  20 mg daily and if GERD symptoms return,  To resume daily PPI and  referral for EGD.

## 2023-12-25 NOTE — Assessment & Plan Note (Signed)
 Refilling albuterol  , change to [employer].  Advised to continue maintenance inhaler

## 2023-12-25 NOTE — Assessment & Plan Note (Signed)
 Incidental finding of a small HH on CT ab pelvis in 2020.

## 2023-12-25 NOTE — Assessment & Plan Note (Signed)
 Continue current supplementation  Lab Results  Component Value Date   VITAMINB12 489 12/23/2023

## 2023-12-25 NOTE — Assessment & Plan Note (Signed)
 Previously attribute to B12 deficiency. She has no historyof recurrent bacterial infections.  Screening for MM given concurrent anemia/   Lab Results  Component Value Date   VITAMINB12 489 12/23/2023     Lab Results  Component Value Date   WBC 3.7 (L) 12/23/2023   HGB 12.3 12/23/2023   HCT 37.7 12/23/2023   MCV 81.2 12/23/2023   PLT 243.0 12/23/2023

## 2023-12-25 NOTE — Assessment & Plan Note (Signed)
I have addressed  BMI and recommended wt loss of 10% of body weight over the next 6 months using a low fat, fruit/vegetable based [place] diet and regular exercise a minimum of 5 days per week.   

## 2023-12-25 NOTE — Assessment & Plan Note (Signed)
 Nonobstructing, noted on 2020 CT scan done to evaluate cause of hematuria.  Cystitis was also noted

## 2023-12-26 MED ORDER — ALBUTEROL SULFATE HFA 108 (90 BASE) MCG/ACT IN AERS: INHALATION_SPRAY | RESPIRATORY_TRACT | 5 refills | Status: AC

## 2023-12-29 ENCOUNTER — Ambulatory Visit: Payer: Self-pay | Admitting: Internal Medicine

## 2023-12-29 ENCOUNTER — Encounter: Payer: Self-pay | Admitting: Internal Medicine

## 2023-12-29 DIAGNOSIS — R809 Proteinuria, unspecified: Secondary | ICD-10-CM | POA: Insufficient documentation

## 2023-12-29 LAB — IFE AND PE, RANDOM URINE
% BETA, Urine: 23 %
ALBUMIN, U: 38.6 %
ALPHA 1 URINE: 4.7 %
ALPHA-2-GLOBULIN, U: 12.2 %
GAMMA GLOBULIN URINE: 21.4 %
Protein, Ur: 29.8 mg/dL

## 2023-12-29 NOTE — Assessment & Plan Note (Signed)
 Recommend starting losartan

## 2024-01-07 ENCOUNTER — Other Ambulatory Visit: Payer: Self-pay | Admitting: Family Medicine

## 2024-01-07 DIAGNOSIS — E559 Vitamin D deficiency, unspecified: Secondary | ICD-10-CM

## 2024-02-11 ENCOUNTER — Encounter: Admitting: Obstetrics

## 2024-02-11 DIAGNOSIS — H21543 Posterior synechiae (iris), bilateral: Secondary | ICD-10-CM | POA: Diagnosis not present

## 2024-02-11 DIAGNOSIS — H35373 Puckering of macula, bilateral: Secondary | ICD-10-CM | POA: Diagnosis not present

## 2024-02-11 DIAGNOSIS — H35353 Cystoid macular degeneration, bilateral: Secondary | ICD-10-CM | POA: Diagnosis not present

## 2024-02-11 DIAGNOSIS — H2013 Chronic iridocyclitis, bilateral: Secondary | ICD-10-CM | POA: Diagnosis not present

## 2024-03-24 DIAGNOSIS — M1711 Unilateral primary osteoarthritis, right knee: Secondary | ICD-10-CM | POA: Diagnosis not present

## 2024-03-29 NOTE — Progress Notes (Unsigned)
 Hannah Verneita CROME, MD   No chief complaint on file.   HPI:      Ms. Hannah Robinson is a 58 y.o. No obstetric history on file. whose LMP was No LMP recorded. Patient is postmenopausal., presents today for NP pap smear, referrd by PCP. Neg pap/neg HPV DNA 06/27/20 Neg mammo at [facility] 4/25   Patient Active Problem List   Diagnosis Date Noted   Microalbuminuria 12/29/2023   Onychomycosis 07/09/2023   Anemia 07/09/2023   Need for influenza vaccination 07/09/2023   Microscopic hematuria 07/21/2022   Breast cancer screening by mammogram 07/21/2022   Postmenopausal estrogen deficiency 07/21/2022   Need for immunization against influenza 07/21/2022   Prediabetes 06/27/2020   B12 deficiency 10/17/2019   Positive ANA (antinuclear antibody) 10/17/2019   Neutropenia (HCC) 10/17/2019   Bilateral nephrolithiasis 05/06/2019   Hiatal hernia 05/06/2019   Arthritis of back 05/06/2019   Gallstones 05/06/2019   Iron deficiency 05/04/2019   Annual physical exam 02/05/2018   Special screening for malignant neoplasms, colon    High risk medication use 07/29/2016   Skin lesion 12/07/2015   Morbid obesity (HCC) 12/04/2014   Routine general medical examination at a health care facility 09/02/2013   Dyshidrotic eczema 09/02/2013   Eczema 07/26/2013   Vitamin D  deficiency 08/09/2012   Iron deficiency anemia 03/01/2012   Screening for skin cancer 03/01/2012   Asthma 08/25/2011   Rhinitis, allergic 08/25/2011   Reflux 08/25/2011   Gastro-esophageal reflux disease without esophagitis 08/25/2011    Past Surgical History:  Procedure Laterality Date   BREAST BIOPSY  1988   CATARACT EXTRACTION W/ INTRAOCULAR LENS IMPLANT Left    b/l eye surgery for cataracts b/l right eye blurry after surgery    COLONOSCOPY WITH PROPOFOL  N/A 03/05/2017   Procedure: COLONOSCOPY WITH PROPOFOL ;  Surgeon: Jinny Carmine, MD;  Location: [facility];  Service: Endoscopy;  Laterality: N/A;   DILATION AND  CURETTAGE OF UTERUS  1979   VAGINAL DELIVERY     2    Family History  Problem Relation Age of Onset   Cancer Mother        uterine ca dx 58 now 11 as of 03/19/22   Clotting disorder Mother    Arthritis Mother    Heart disease Mother    Bipolar disorder Brother    Stroke Maternal Grandmother    Asthma Son    Alcohol abuse Maternal Aunt    Cancer Maternal Aunt        lung    Social History   Socioeconomic History   Marital status: Single    Spouse name: Not on file   Number of children: 2   Years of education: Not on file   Highest education level: Not on file  Occupational History   Occupation: Mail carrier  Tobacco Use   Smoking status: Never   Smokeless tobacco: Never  Vaping Use   Vaping status: Never Used  Substance and Sexual Activity   Alcohol use: Yes    Alcohol/week: 3.0 standard drinks of alcohol    Types: 3 Glasses of wine per week    Comment: Wine 3 reg pour/week   Drug use: No   Sexual activity: Not on file  Other Topics Concern   Not on file  Social History Narrative   Lives in [place] with sons, from [place].      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    Diet - regular  Exercise - walks with work      1 grandson 2 in 08/2020    Social Drivers of Corporate investment banker Strain: Not on file  Food Insecurity: Not on file  Transportation Needs: Not on file  Physical Activity: Not on file  Stress: Not on file  Social Connections: Not on file  Intimate Partner Violence: Not on file    Outpatient Medications Prior to Visit  Medication Sig Dispense Refill   albuterol  (VENTOLIN  HFA) 108 (90 Base) MCG/ACT inhaler INHALE 2 PUFFS BY MOUTH EVERY 4 HOURS AS NEEDED FOR WHEEZING 153 g 5   brimonidine (ALPHAGAN) 0.2 % ophthalmic solution 1 drop 2 (two) times daily.     ciclopirox  (PENLAC ) 8 % solution Apply topically at bedtime. Apply over nail and surrounding skin. Apply daily over previous coat. After seven (7) days, may remove with  alcohol and continue cycle. (Patient not taking: Reported on 12/23/2023) 6.6 mL 11   dorzolamide (TRUSOPT) 2 % ophthalmic solution 1 drop 2 (two) times daily.     DUREZOL 0.05 % EMUL PLACE 1 DROP b/l bid  6   EPINEPHrine  0.3 mg/0.3 mL IJ SOAJ injection Inject 0.3 mg into the muscle as needed for anaphylaxis. 1 each 1   fluticasone  furoate-vilanterol (BREO ELLIPTA ) 100-25 MCG/ACT AEPB Inhale 1 puff into the lungs daily. (Patient not taking: Reported on 12/23/2023) 1 each 11   meloxicam  (MOBIC ) 15 MG tablet meloxicam  15 mg tablet  TAKE 1 TABLET(S)daily prn DO NOT TAKE WITH IBUPROFEN  90 tablet 1   Multiple Vitamin (MULTIVITAMIN) tablet Take 1 tablet by mouth daily.     nystatin  (MYCOSTATIN /NYSTOP ) powder APPLY 1 APPLICATION TOPICALLY 3 (THREE) TIMES DAILY. APPLY TO AFFECTED AREA (Patient not taking: Reported on 12/23/2023) 15 g 0   Omega-3 Fatty Acids (FISH OIL) 300 MG CAPS Take 1 capsule by mouth daily.     TURMERIC CURCUMIN PO Take 1 capsule by mouth daily.     No facility-administered medications prior to visit.      ROS:  Review of Systems BREAST: No symptoms   OBJECTIVE:   Vitals:  There were no vitals taken for this visit.  Physical Exam  Results: No results found for this or any previous visit (from the past 24 hours).   Assessment/Plan: No diagnosis found.    No orders of the defined types were placed in this encounter.     No follow-ups on file.  Nasire Reali B. Jennfier Abdulla, PA-C 03/29/2024 5:08 PM

## 2024-03-31 ENCOUNTER — Other Ambulatory Visit (HOSPITAL_COMMUNITY)
Admission: RE | Admit: 2024-03-31 | Discharge: 2024-03-31 | Disposition: A | Discharge status: Home / Self Care | Source: Ambulatory Visit | Attending: Obstetrics and Gynecology | Admitting: Obstetrics and Gynecology

## 2024-03-31 ENCOUNTER — Ambulatory Visit (INDEPENDENT_AMBULATORY_CARE_PROVIDER_SITE_OTHER): Admitting: Obstetrics and Gynecology

## 2024-03-31 ENCOUNTER — Encounter: Payer: Self-pay | Admitting: Obstetrics and Gynecology

## 2024-03-31 VITALS — BP 128/77 | HR 84 | Ht 66.0 in | Wt 262.0 lb

## 2024-03-31 DIAGNOSIS — R3129 Other microscopic hematuria: Secondary | ICD-10-CM | POA: Diagnosis not present

## 2024-03-31 DIAGNOSIS — Z113 Encounter for screening for infections with a predominantly sexual mode of transmission: Secondary | ICD-10-CM | POA: Insufficient documentation

## 2024-03-31 DIAGNOSIS — Z01419 Encounter for gynecological examination (general) (routine) without abnormal findings: Secondary | ICD-10-CM | POA: Diagnosis not present

## 2024-03-31 DIAGNOSIS — Z1151 Encounter for screening for human papillomavirus (HPV): Secondary | ICD-10-CM

## 2024-03-31 DIAGNOSIS — Z124 Encounter for screening for malignant neoplasm of cervix: Secondary | ICD-10-CM | POA: Insufficient documentation

## 2024-03-31 LAB — POCT URINALYSIS DIPSTICK
Bilirubin, UA: NEGATIVE
Blood, UA: POSITIVE
Glucose, UA: NEGATIVE
Ketones, UA: NEGATIVE
Nitrite, UA: NEGATIVE
Protein, UA: POSITIVE — AB
Spec Grav, UA: 1.02 (ref 1.010–1.025)
Urobilinogen, UA: 0.2 U/dL
pH, UA: 7.5 (ref 5.0–8.0)

## 2024-03-31 NOTE — Patient Instructions (Signed)
 I value your feedback and you entrusting [place] with your care. If you get a [place] patient survey, I would appreciate you taking the time to let us know about your experience today. Thank you! ? ? ?

## 2024-04-03 ENCOUNTER — Ambulatory Visit: Payer: Self-pay | Admitting: Obstetrics and Gynecology

## 2024-04-03 LAB — URINE CULTURE

## 2024-04-03 NOTE — Addendum Note (Signed)
 Addended by: WATT HILA B on: 04/03/2024 03:46 PM   Modules accepted: Orders

## 2024-04-04 LAB — CYTOLOGY - PAP
Adequacy: ABSENT
Chlamydia: NEGATIVE
Comment: NEGATIVE
Comment: NEGATIVE
Comment: NORMAL
Diagnosis: NEGATIVE
High risk HPV: NEGATIVE
Neisseria Gonorrhea: NEGATIVE

## 2024-04-08 ENCOUNTER — Telehealth: Payer: Self-pay

## 2024-04-08 NOTE — Telephone Encounter (Signed)
 Patient called she thinks she has a UTI. She was evaluated by [facility] on 09/11. Urine culture and U/A was obtained. She suggested that she follow up in two week for recheck. Patient called states that she feels symptomatic and wants prescription called to pharmacy. Is it ok to send her something regardless of [facility] wanting recheck in 2 weeks. Please advise.

## 2024-04-11 NOTE — Telephone Encounter (Signed)
 Pt needs to RTO for urine check. C&S neg last time but had blood in her urine. Need to eval that with UA, [facility] UA and can do C&S. Pt needs to increase water  and d/c caffeine (if consuming any).  Hannah Robinson

## 2024-04-12 NOTE — Telephone Encounter (Signed)
 The patient has been contacted and scheduled for Tuesday April 19, 2024 at 11:15.

## 2024-04-19 ENCOUNTER — Ambulatory Visit

## 2024-04-19 VITALS — BP 148/91 | HR 73 | Ht 66.0 in | Wt 254.9 lb

## 2024-04-19 DIAGNOSIS — R300 Dysuria: Secondary | ICD-10-CM | POA: Diagnosis not present

## 2024-04-19 LAB — POCT URINALYSIS DIPSTICK
Bilirubin, UA: NEGATIVE
Blood, UA: NEGATIVE
Glucose, UA: NEGATIVE
Nitrite, UA: POSITIVE
Protein, UA: POSITIVE — AB
Spec Grav, UA: 1.015 (ref 1.010–1.025)
Urobilinogen, UA: 0.2 U/dL
pH, UA: 5 (ref 5.0–8.0)

## 2024-04-19 MED ORDER — SULFAMETHOXAZOLE-TRIMETHOPRIM 800-160 MG PO TABS: 1.0000 | ORAL_TABLET | Freq: Two times a day (BID) | ORAL | 0 refills | Status: AC

## 2024-04-19 NOTE — Patient Instructions (Addendum)
 Urinary Tract Infection, Female A urinary tract infection (UTI) is an infection in your urinary tract. The urinary tract is made up of organs that make, store, and get rid of pee (urine) in your body. These organs include: The kidneys. The ureters. The bladder. The urethra. What are the causes? Most UTIs are caused by germs called bacteria. They may be in or near your genitals. These germs grow and cause swelling in your urinary tract. What increases the risk? You're more likely to get a UTI if: You're a female. The urethra is shorter in females than in males. You have a soft tube called a catheter that drains your pee. You can't control when you pee or poop. You have trouble peeing because of: A kidney stone. A urinary blockage. A nerve condition that affects your bladder. Not getting enough to drink. You're sexually active. You use a birth control inside your vagina, like spermicide. You're pregnant. You have low levels of the hormone estrogen in your body. You're an older adult. You're also more likely to get a UTI if you have other health problems. These may include: Diabetes. A weak immune system. Your immune system is your body's defense system. Sickle cell disease. Injury of the spine. What are the signs or symptoms? Symptoms may include: Needing to pee right away. Peeing small amounts often. Pain or burning when you pee. Blood in your pee. Pee that smells bad or odd. Pain in your belly or lower back. You may also: Feel confused. This may be the first symptom in older adults. Vomit. Not feel hungry. Feel tired or easily annoyed. Have a fever or chills. How is this diagnosed? A UTI is diagnosed based on your medical history and an exam. You may also have other tests. These may include: Pee tests. Blood tests. Tests for sexually transmitted infections (STIs). If you've had more than one UTI, you may need to have imaging studies done to find out why you keep getting  them. How is this treated? A UTI can be treated by: Taking antibiotics or other medicines. Drinking enough fluid to keep your pee pale yellow. In rare cases, a UTI can cause a very bad condition called sepsis. Sepsis may be treated in the hospital. Follow these instructions at home: Medicines Take your medicines only as told by your health care provider. If you were given antibiotics, take them as told by your provider. Do not stop taking them even if you start to feel better. General instructions Make sure you: Pee often and fully. Do not hold your pee for a long time. Wipe from front to back after you pee or poop. Use each tissue only once when you wipe. Pee after you have sex. Do not douche or use sprays or powders in your genital area. Contact a health care provider if: Your symptoms don't get better after 1-2 days of taking antibiotics. Your symptoms go away and then come back. You have a fever or chills. You vomit or feel like you may vomit. Get help right away if: You have very bad pain in your back or lower belly. You faint. This information is not intended to replace advice given to you by your health care provider. Make sure you discuss any questions you have with your health care provider. Document Revised: 06/17/2023 Document Reviewed: 10/10/2022 Elsevier Patient Education  2025 Elsevier Inc.   Sulfamethoxazole ; Trimethoprim  Tablets What is this medication? SULFAMETHOXAZOLE ; TRIMETHOPRIM  (suhl fuh meth OK suh zohl; trye METH oh prim) treats infections  caused by bacteria. It belongs to a group of medications called sulfonamide antibiotics. It will not treat colds, the flu, or infections caused by viruses. This medicine may be used for other purposes; ask your health care provider or pharmacist if you have questions. COMMON BRAND NAME(S): Bacter-Aid DS, Bactrim , Bactrim  DS, Septra , Septra  DS What should I tell my care team before I take this medication? They need to know  if you have any of these conditions: G6PD deficiency HIV or AIDS Kidney disease Liver disease Low platelet levels Low red blood cell levels Poor nutrition Stomach or intestine problems, such as colitis Thyroid  disease An unusual or allergic reaction to sulfamethoxazole , trimethoprim , other medications, foods, dyes, or preservatives Pregnant or trying to get pregnant Breast-feeding How should I use this medication? Take this medication by mouth with a glass of water . Follow the directions on the prescription label. Take your medication at regular intervals. Do not take it more often than directed. Take all of your medication as directed even if you think you are better. Do not skip doses or stop your medication early. Talk to your care team about the use of this medication in children. While this medication may be prescribed for children as young as 2 months for selected conditions, precautions do apply. Overdosage: If you think you have taken too much of this medicine contact a poison control center or emergency room at once. NOTE: This medicine is only for you. Do not share this medicine with others. What if I miss a dose? If you miss a dose, take it as soon as you can. If it is almost time for your next dose, take only that dose. Do not take double or extra doses. What may interact with this medication? Do not take this medication with any of the following: Dofetilide This medication may also interact with the following: Amantadine Certain medications for blood pressure or heart disease Certain medications for depression, such as amitriptyline Certain medications for diabetes, such as glipizide or glyburide Certain medications that treat or prevent blood clots, such as warfarin Cyclosporine Digoxin Diuretics Estrogen and progestin hormones Indomethacin Methotrexate Phenytoin Procainamide Pyrimethamine Zidovudine This list may not describe all possible interactions. Give your  health care provider a list of all the medicines, herbs, non-prescription drugs, or dietary supplements you use. Also tell them if you smoke, drink alcohol, or use illegal drugs. Some items may interact with your medicine. What should I watch for while using this medication? Tell your care team if your symptoms do not start to get better or if they get worse. Do not treat diarrhea with over the counter products. Contact your care team if you have diarrhea that lasts more than 2 days or if it is severe and watery. This medication may cause serious skin reactions. They can happen weeks to months after starting the medication. Contact your care team right away if you notice fevers or flu-like symptoms with a rash. The rash may be red or purple and then turn into blisters or peeling of the skin. Or, you might notice a red rash with swelling of the face, lips or lymph nodes in your neck or under your arms. This medication can make you more sensitive to the sun. Keep out of the sun. If you cannot avoid being in the sun, wear protective clothing and sunscreen. Do not use sun lamps or tanning beds/booths. Be careful brushing or flossing your teeth or using a toothpick because you may get an infection or bleed  more easily. If you have any dental work done, tell your dentist you are receiving this medication. What side effects may I notice from receiving this medication? Side effects that usually do not require medical attention (report these to your care team if they continue or are bothersome): Loss of appetite Nausea Vomiting Stomach pain, bloody diarrhea, pale skin, unusual weakness or fatigue, decrease in the amount of urine, which may be signs of hemolytic uremic syndrome Stomach pain, bloody diarrhea, pale skin, unusual weakness or fatigue, decrease in the amount of urine, which may be signs of hemolytic uremic syndrome High potassium level--muscle weakness, fast or irregular heartbeat High potassium  level--muscle weakness, fast or irregular heartbeat Liver injury--right upper belly pain, loss of appetite, nausea, light-colored stool, dark yellow or brown urine, yellowing skin or eyes, unusual weakness or fatigue Liver injury--right upper belly pain, loss of appetite, nausea, light-colored stool, dark yellow or brown urine, yellowing skin or eyes, unusual weakness or fatigue Low blood sugar (hypoglycemia)--tremors or shaking, anxiety, sweating, cold or clammy skin, confusion, dizziness, rapid heartbeat Low blood sugar (hypoglycemia)--tremors or shaking, anxiety, sweating, cold or clammy skin, confusion, dizziness, rapid heartbeat Low potassium level--muscle pain or cramps, unusual weakness or fatigue, fast or irregular heartbeat, constipation Low potassium level--muscle pain or cramps, unusual weakness or fatigue, fast or irregular heartbeat, constipation Low thyroid  levels (hypothyroidism)--unusual weakness or fatigue, increased sensitivity to cold, constipation, hair loss, dry skin, weight gain, feelings of depression Rash, fever, and swollen lymph nodes Redness, blistering, peeling, or loosening of the skin, including inside the mouth Severe diarrhea, fever Unusual vaginal discharge, itching, or odor This list may not describe all possible side effects. Call your doctor for medical advice about side effects. You may report side effects to FDA at 1-800-FDA-1088. Where should I keep my medication? Keep out of the reach of children. Store between 15 and 25 degrees C (59 to 77 degrees F). Protect from light. Keep the container tightly closed. Throw away any unused medication after the expiration date. NOTE: This sheet is a summary. It may not cover all possible information. If you have questions about this medicine, talk to your doctor, pharmacist, or health care provider.  2025 Elsevier/Gold Standard (2023-09-15 00:00:00)

## 2024-04-19 NOTE — Progress Notes (Signed)
    NURSE VISIT NOTE  Subjective:    Patient ID: Hannah Robinson, female    DOB: September 19, 1965, 58 y.o.   MRN: [id number]       HPI  Patient is a 58 y.o. [id number] female who presents for dysuria, urinary frequency, and urinary urgency for 2 week.  Patient denies hematuria, flank pain, abdominal pain, pelvic pain, cloudy malordorous urine, genital irritation, and vaginal discharge.  Patient does not have a history of recurrent UTI.  Patient does not have a history of pyelonephritis.    Objective:    BP (!) 148/91   Pulse 73   Ht 5' 6 (1.676 m)   Wt 254 lb 14.4 oz (115.6 kg)   BMI 41.14 kg/m    Lab Review  Results for orders placed or performed in visit on 04/19/24  POCT urinalysis dipstick  Result Value Ref Range   Color, UA yellow    Clarity, UA clear    Glucose, UA Negative Negative   Bilirubin, UA negative    Ketones, UA moderate    Spec Grav, UA 1.015 1.010 - 1.025   Blood, UA negative    pH, UA 5.0 5.0 - 8.0   Protein, UA Positive (A) Negative   Urobilinogen, UA 0.2 0.2 or 1.0 E.U./dL   Nitrite, UA positive    Leukocytes, UA Moderate (2+) (A) Negative   Appearance     Odor      Assessment:   1. Dysuria      Plan:   Urine Culture Sent. Treatment  Bactrim  DS 1 PO BID for 7 days. Maintain adequate hydration.  Follow up if symptoms worsen or fail to improve as anticipated, and as needed.    Rollo JINNY Maxin, CMA

## 2024-04-21 LAB — URINE CULTURE

## 2024-05-05 DIAGNOSIS — H2013 Chronic iridocyclitis, bilateral: Secondary | ICD-10-CM | POA: Diagnosis not present

## 2024-05-05 DIAGNOSIS — H21543 Posterior synechiae (iris), bilateral: Secondary | ICD-10-CM | POA: Diagnosis not present

## 2024-05-05 DIAGNOSIS — H35353 Cystoid macular degeneration, bilateral: Secondary | ICD-10-CM | POA: Diagnosis not present

## 2024-05-05 DIAGNOSIS — H35373 Puckering of macula, bilateral: Secondary | ICD-10-CM | POA: Diagnosis not present

## 2024-05-26 ENCOUNTER — Other Ambulatory Visit: Payer: Self-pay | Admitting: Certified Nurse Midwife

## 2024-05-26 DIAGNOSIS — R300 Dysuria: Secondary | ICD-10-CM

## 2024-06-08 DIAGNOSIS — H4043X1 Glaucoma secondary to eye inflammation, bilateral, mild stage: Secondary | ICD-10-CM | POA: Diagnosis not present

## 2024-07-04 ENCOUNTER — Other Ambulatory Visit: Payer: Federal, State, Local not specified - PPO

## 2024-07-04 ENCOUNTER — Other Ambulatory Visit

## 2024-07-04 DIAGNOSIS — E785 Hyperlipidemia, unspecified: Secondary | ICD-10-CM | POA: Diagnosis not present

## 2024-07-04 DIAGNOSIS — R030 Elevated blood-pressure reading, without diagnosis of hypertension: Secondary | ICD-10-CM

## 2024-07-04 DIAGNOSIS — R7303 Prediabetes: Secondary | ICD-10-CM

## 2024-07-04 DIAGNOSIS — D649 Anemia, unspecified: Secondary | ICD-10-CM

## 2024-07-04 LAB — COMPREHENSIVE METABOLIC PANEL WITH GFR
ALT: 11 U/L (ref 0–35)
AST: 15 U/L (ref 0–37)
Albumin: 3.9 g/dL (ref 3.5–5.2)
Alkaline Phosphatase: 78 U/L (ref 39–117)
BUN: 18 mg/dL (ref 6–23)
CO2: 27 meq/L (ref 19–32)
Calcium: 9 mg/dL (ref 8.4–10.5)
Chloride: 106 meq/L (ref 96–112)
Creatinine, Ser: 0.67 mg/dL (ref 0.40–1.20)
GFR: 95.82 mL/min (ref >60.00)
Glucose, Bld: 104 mg/dL — ABNORMAL HIGH (ref 70–99)
Potassium: 4.1 meq/L (ref 3.5–5.1)
Sodium: 140 meq/L (ref 135–145)
Total Bilirubin: 0.4 mg/dL (ref 0.2–1.2)
Total Protein: 7.2 g/dL (ref 6.0–8.3)

## 2024-07-04 LAB — CBC WITH DIFFERENTIAL/PLATELET
Basophils Absolute: 0 K/uL (ref 0.0–0.1)
Basophils Relative: 0.6 % (ref 0.0–3.0)
Eosinophils Absolute: 0.1 K/uL (ref 0.0–0.7)
Eosinophils Relative: 3.5 % (ref 0.0–5.0)
HCT: 35.8 % — ABNORMAL LOW (ref 36.0–46.0)
Hemoglobin: 11.8 g/dL — ABNORMAL LOW (ref 12.0–15.0)
Lymphocytes Relative: 49.7 % — ABNORMAL HIGH (ref 12.0–46.0)
Lymphs Abs: 1.6 K/uL (ref 0.7–4.0)
MCHC: 32.8 g/dL (ref 30.0–36.0)
MCV: 81.4 fl (ref 78.0–100.0)
Monocytes Absolute: 0.3 K/uL (ref 0.1–1.0)
Monocytes Relative: 7.6 % (ref 3.0–12.0)
Neutro Abs: 1.3 K/uL — ABNORMAL LOW (ref 1.4–7.7)
Neutrophils Relative %: 38.6 % — ABNORMAL LOW (ref 43.0–77.0)
Platelets: 225 K/uL (ref 150.0–400.0)
RBC: 4.4 Mil/uL (ref 3.87–5.11)
RDW: 14.9 % (ref 11.5–15.5)
WBC: 3.3 K/uL — ABNORMAL LOW (ref 4.0–10.5)

## 2024-07-04 LAB — LIPID PANEL
Cholesterol: 151 mg/dL (ref 0–200)
HDL: 61.9 mg/dL (ref >39.00)
LDL Cholesterol: 82 mg/dL (ref 0–99)
NonHDL: 89.33
Total CHOL/HDL Ratio: 2
Triglycerides: 38 mg/dL (ref 0.0–149.0)
VLDL: 7.6 mg/dL (ref 0.0–40.0)

## 2024-07-04 LAB — LDL CHOLESTEROL, DIRECT: Direct LDL: 83 mg/dL

## 2024-07-04 LAB — HEMOGLOBIN A1C: Hgb A1c MFr Bld: 5.9 % (ref 4.6–6.5)

## 2024-07-04 NOTE — Addendum Note (Signed)
 Addended by: EMERY PRESIDENT D on: 07/04/2024 08:18 AM   Modules accepted: Orders

## 2024-07-06 ENCOUNTER — Ambulatory Visit: Admitting: Internal Medicine

## 2024-07-06 ENCOUNTER — Encounter: Payer: Self-pay | Admitting: Internal Medicine

## 2024-07-06 VITALS — BP 146/92 | HR 87 | Ht 66.0 in | Wt 251.4 lb

## 2024-07-06 DIAGNOSIS — D649 Anemia, unspecified: Secondary | ICD-10-CM | POA: Diagnosis not present

## 2024-07-06 DIAGNOSIS — Z1231 Encounter for screening mammogram for malignant neoplasm of breast: Secondary | ICD-10-CM

## 2024-07-06 DIAGNOSIS — I129 Hypertensive chronic kidney disease with stage 1 through stage 4 chronic kidney disease, or unspecified chronic kidney disease: Secondary | ICD-10-CM | POA: Diagnosis not present

## 2024-07-06 DIAGNOSIS — Z0000 Encounter for general adult medical examination without abnormal findings: Secondary | ICD-10-CM

## 2024-07-06 DIAGNOSIS — I10 Essential (primary) hypertension: Secondary | ICD-10-CM

## 2024-07-06 DIAGNOSIS — D709 Neutropenia, unspecified: Secondary | ICD-10-CM | POA: Diagnosis not present

## 2024-07-06 DIAGNOSIS — Z0001 Encounter for general adult medical examination with abnormal findings: Secondary | ICD-10-CM

## 2024-07-06 DIAGNOSIS — J45909 Unspecified asthma, uncomplicated: Secondary | ICD-10-CM | POA: Diagnosis not present

## 2024-07-06 MED ORDER — LOSARTAN POTASSIUM 25 MG PO TABS: 25.0000 mg | ORAL_TABLET | Freq: Every day | ORAL | 0 refills | Status: AC

## 2024-07-06 NOTE — Assessment & Plan Note (Signed)
 Complicated by hypertension and hypertensive neprhropathy'.  Weight loss of 25 lbs advised.  Starting losartan 

## 2024-07-06 NOTE — Assessment & Plan Note (Signed)

## 2024-07-06 NOTE — Assessment & Plan Note (Addendum)
 With proteinuria and uacr > 30. Noted in June.    Starting losartan  today .  Return in  5-7 days for lab visit and RN for BP heck

## 2024-07-06 NOTE — Assessment & Plan Note (Signed)
 Refilling albuterol  , change to [employer].  Advised to continue maintenance inhaler

## 2024-07-06 NOTE — Patient Instructions (Signed)
 You have  HYPERTENSION AND protein in your urine .   I  recommend adding a very low dose of a  medication called losartan . The losartan  can  prevent the proteinuria from getting worse by lowering the amount of pressure exerted on the kidneys by lowering your blood pressure.  If your blood pressure becomes too low,  we will reduce  your other medication,       . Once you start the medication, I would like you to return for a RN /lab visit after 5-7days  for recheck of BP and kidney function   Our goal is to keep your BP < 130/80 and reduce the protein in your urine,  which we will recheck in 3 months   The rest of your labs are excellent!  YOUR MAMMOGRAM Ihas been ordered , PLEASE CALL AND GET THIS SCHEDULED! [facility] - call [phone]  [facility] does  the scheduling for [facility] as well

## 2024-07-06 NOTE — Progress Notes (Signed)
 Patient ID: Hannah Robinson, female    DOB: Jul 30, 1965  Age: 58 y.o. MRN: [id number]  The patient is here for annual preventive examination and management of other chronic and acute problems.   The risk factors are reflected in the social history.   The roster of all physicians providing medical care to patient - is listed in the Snapshot section of the chart.   Activities of daily living:  The patient is 100% independent in all ADLs: dressing, toileting, feeding as well as independent mobility   Home safety : The patient has smoke detectors in the home. They wear seatbelts.  There are no unsecured firearms at home. There is no violence in the home.    There is no risks for hepatitis, STDs or HIV. There is no   history of blood transfusion. They have no travel history to infectious disease endemic areas of the world.   The patient has seen their dentist in the last six month. They have seen their eye doctor in the last year. The patinet  denies slight hearing difficulty with regard to whispered voices and some television programs.  They have deferred audiologic testing in the last year.  They do not  have excessive sun exposure. Discussed the need for sun protection: hats, long sleeves and use of sunscreen if there is significant sun exposure.    Diet: the importance of a healthy diet is discussed. They do have a healthy diet.   The benefits of regular aerobic exercise were discussed. 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    Depression screen: there are no signs or vegative symptoms of depression- irritability, change in appetite, anhedonia, sadness/tearfullness.   The following portions of the patient's history were reviewed and updated as appropriate: allergies, current medications, past family history, past medical history,  past surgical history, past social history  and problem list.   Visual acuity was not assessed per  patient preference since the patient has regular follow up with an  ophthalmologist. Hearing and body mass index were assessed and reviewed.    During the course of the visit the patient was educated and counseled about appropriate screening and preventive services including : fall prevention , diabetes screening, nutrition counseling, colorectal cancer screening, and recommended immunizations.    Chief Complaint:  Stress:  mother visiting dur to multiple family deaths   and has started vaginal bleeding , history of ovarian ca  Elevated BP in office;  2nd time  checked it elsewhere ,  was 127/92   proteinuria noted in June,  again at [facility] office in September. Discussed today ..  ,     Review of Symptoms  Patient denies headache, fevers, malaise, unintentional weight loss, skin rash, eye pain, sinus congestion and sinus pain, sore throat, dysphagia,  hemoptysis , cough, dyspnea, wheezing, chest pain, palpitations, orthopnea, edema, abdominal pain, nausea, melena, diarrhea, constipation, flank pain, dysuria, hematuria, urinary  Frequency, nocturia, numbness, tingling, seizures,  Focal weakness, Loss of consciousness,  Tremor, insomnia, depression, anxiety, and suicidal ideation.    Physical Exam:  BP (!) 146/92   Pulse 87   Ht 5' 6 (1.676 m)   Wt 251 lb 6.4 oz (114 kg)   SpO2 98%   BMI 40.58 kg/m    Physical Exam Vitals reviewed.  Constitutional:      General: She is not in acute distress.    Appearance: Normal appearance. She is well-developed. She is  obese. She is not ill-appearing, toxic-appearing or diaphoretic.  HENT:     Head: Normocephalic.     Right Ear: Tympanic membrane, ear canal and external ear normal. There is no impacted cerumen.     Left Ear: Tympanic membrane, ear canal and external ear normal. There is no impacted cerumen.     Nose: Nose normal.     Mouth/Throat:     Mouth: Mucous membranes are moist.     Pharynx: Oropharynx is clear.  Eyes:     General: No  scleral icterus.       Right eye: No discharge.        Left eye: No discharge.     Conjunctiva/sclera: Conjunctivae normal.     Pupils: Pupils are equal, round, and reactive to light.  Neck:     Thyroid : No thyromegaly.     Vascular: No carotid bruit or JVD.  Cardiovascular:     Rate and Rhythm: Normal rate and regular rhythm.     Heart sounds: Normal heart sounds.  Pulmonary:     Effort: Pulmonary effort is normal. No respiratory distress.     Breath sounds: Normal breath sounds.  Chest:  Breasts:    Breasts are symmetrical.     Right: Normal. No swelling, inverted nipple, mass, nipple discharge, skin change or tenderness.     Left: Normal. No swelling, inverted nipple, mass, nipple discharge, skin change or tenderness.     Comments: Pendulous breasts  Abdominal:     General: Bowel sounds are normal.     Palpations: Abdomen is soft. There is no mass.     Tenderness: There is no abdominal tenderness. There is no guarding or rebound.  Musculoskeletal:        General: Normal range of motion.     Cervical back: Normal range of motion and neck supple.  Lymphadenopathy:     Cervical: No cervical adenopathy.     Upper Body:     Right upper body: No supraclavicular, axillary or pectoral adenopathy.     Left upper body: No supraclavicular, axillary or pectoral adenopathy.  Skin:    General: Skin is warm and dry.  Neurological:     General: No focal deficit present.     Mental Status: She is alert and oriented to person, place, and time. Mental status is at baseline.  Psychiatric:        Mood and Affect: Mood normal.        Behavior: Behavior normal.        Thought Content: Thought content normal.        Judgment: Judgment normal.     Assessment and Plan: Encounter for screening mammogram for malignant neoplasm of breast -     3D Screening Mammogram, Left and Right; Future  Encounter for preventative adult health care examination  Neutropenia, unspecified type  Anemia,  unspecified type Assessment & Plan: Minimal change,  with normal iron, b12 and folate,  negative screen for MM .  Will follow   Lab Results  Component Value Date   IRON 51 12/23/2023   TIBC 281.4 12/23/2023   FERRITIN 161.7 12/23/2023   Lab Results  Component Value Date   VITAMINB12 489 12/23/2023   Lab Results  Component Value Date   FOLATE 9.2 12/23/2023      Uncomplicated asthma, unspecified asthma severity, unspecified whether persistent Assessment & Plan: Refilling albuterol  , change to [employer].  Advised to continue maintenance inhaler    Essential hypertension Assessment & Plan:  With proteinuria and uacr > 30. Noted in June.    Starting losartan  today .  Return in  5-7 days for lab visit and RN for BP heck   Orders: -     Basic metabolic panel with GFR; Future  Encounter for general adult medical examination with abnormal findings Assessment & Plan: age appropriate education and counseling updated, referrals for preventative services and immunizations addressed, dietary and smoking counseling addressed, most recent labs reviewed.  I have personally reviewed and have noted:   1) the patient's medical and social history 2) The pt's use of alcohol, tobacco, and illicit drugs 3) The patient's current medications and supplements 4) Functional ability including ADL's, fall risk, home safety risk, hearing and visual impairment 5) Diet and physical activities 6) Evidence for depression or mood disorder 7) The patient's height, weight, and BMI have been recorded in the chart    I have made referrals, and provided counseling and education based on review of the above    Hypertensive nephropathy Assessment & Plan: With hypertension and uacr > 30/ .  Starting losartan   Lab Results  Component Value Date   LABMICR Comment 02/04/2018   MICROALBUR 8.0 (H) 12/23/2023        Morbid obesity (HCC) Assessment & Plan: Complicated by hypertension and hypertensive  neprhropathy'.  Weight loss of 25 lbs advised.  Starting losartan    Other orders -     Losartan  Potassium; Take 1 tablet (25 mg total) by mouth daily.  Dispense: 90 tablet; Refill: 0    Return in about 3 months (around 10/04/2024) for hypertension.  Hannah LITTIE Kettering, MD

## 2024-07-06 NOTE — Assessment & Plan Note (Signed)
 With hypertension and uacr > 30/ .  Starting losartan   Lab Results  Component Value Date   LABMICR Comment 02/04/2018   MICROALBUR 8.0 (H) 12/23/2023

## 2024-07-06 NOTE — Assessment & Plan Note (Signed)
 Minimal change,  with normal iron, b12 and folate,  negative screen for MM .  Will follow   Lab Results  Component Value Date   IRON 51 12/23/2023   TIBC 281.4 12/23/2023   FERRITIN 161.7 12/23/2023   Lab Results  Component Value Date   VITAMINB12 489 12/23/2023   Lab Results  Component Value Date   FOLATE 9.2 12/23/2023

## 2024-07-12 ENCOUNTER — Encounter: Admitting: Internal Medicine

## 2024-07-12 ENCOUNTER — Other Ambulatory Visit (INDEPENDENT_AMBULATORY_CARE_PROVIDER_SITE_OTHER)

## 2024-07-12 ENCOUNTER — Ambulatory Visit: Payer: Self-pay | Admitting: Internal Medicine

## 2024-07-12 ENCOUNTER — Encounter: Payer: Federal, State, Local not specified - PPO | Admitting: Family Medicine

## 2024-07-12 DIAGNOSIS — I10 Essential (primary) hypertension: Secondary | ICD-10-CM | POA: Diagnosis not present

## 2024-07-12 LAB — BASIC METABOLIC PANEL WITH GFR
BUN: 14 mg/dL (ref 6–23)
CO2: 28 meq/L (ref 19–32)
Calcium: 8.9 mg/dL (ref 8.4–10.5)
Chloride: 103 meq/L (ref 96–112)
Creatinine, Ser: 0.62 mg/dL (ref 0.40–1.20)
GFR: 97.61 mL/min
Glucose, Bld: 97 mg/dL (ref 70–99)
Potassium: 4.1 meq/L (ref 3.5–5.1)
Sodium: 139 meq/L (ref 135–145)

## 2024-07-20 ENCOUNTER — Ambulatory Visit

## 2024-07-20 ENCOUNTER — Ambulatory Visit: Admitting: Internal Medicine

## 2024-07-20 ENCOUNTER — Ambulatory Visit (INDEPENDENT_AMBULATORY_CARE_PROVIDER_SITE_OTHER)

## 2024-07-20 DIAGNOSIS — I10 Essential (primary) hypertension: Secondary | ICD-10-CM | POA: Diagnosis not present

## 2024-07-20 DIAGNOSIS — K080 Exfoliation of teeth due to systemic causes: Secondary | ICD-10-CM | POA: Diagnosis not present

## 2024-07-20 NOTE — Progress Notes (Signed)
 Patient is in office today for a nurse visit for Blood Pressure Check. Patient blood pressure was 132/84/76, Patient does not have any symptoms.

## 2024-10-05 ENCOUNTER — Ambulatory Visit: Admitting: Internal Medicine
